# Patient Record
Sex: Male | Born: 1960 | Race: White | Hispanic: No | Marital: Married | Smoking: Never smoker
Health system: Southern US, Community
[De-identification: ages and names within clinical notes are randomized; demographics above are authoritative.]

## PROBLEM LIST (undated history)

## (undated) DIAGNOSIS — A9230 West Nile virus infection, unspecified: Secondary | ICD-10-CM

## (undated) DIAGNOSIS — Z8673 Personal history of transient ischemic attack (TIA), and cerebral infarction without residual deficits: Secondary | ICD-10-CM

## (undated) DIAGNOSIS — R251 Tremor, unspecified: Secondary | ICD-10-CM

## (undated) DIAGNOSIS — E785 Hyperlipidemia, unspecified: Secondary | ICD-10-CM

## (undated) DIAGNOSIS — I10 Essential (primary) hypertension: Secondary | ICD-10-CM

## (undated) DIAGNOSIS — G4730 Sleep apnea, unspecified: Secondary | ICD-10-CM

## (undated) HISTORY — DX: Sleep apnea, unspecified: G47.30

## (undated) HISTORY — DX: Hyperlipidemia, unspecified: E78.5

## (undated) HISTORY — DX: Essential (primary) hypertension: I10

## (undated) HISTORY — DX: Tremor, unspecified: R25.1

## (undated) HISTORY — DX: West nile virus infection, unspecified: A92.30

## (undated) HISTORY — PX: EYE SURGERY: SHX253

---

## 2006-05-22 DIAGNOSIS — A9230 West Nile virus infection, unspecified: Secondary | ICD-10-CM

## 2006-05-22 HISTORY — DX: West nile virus infection, unspecified: A92.30

## 2011-02-07 DIAGNOSIS — D369 Benign neoplasm, unspecified site: Secondary | ICD-10-CM | POA: Insufficient documentation

## 2011-02-07 DIAGNOSIS — G4733 Obstructive sleep apnea (adult) (pediatric): Secondary | ICD-10-CM | POA: Insufficient documentation

## 2011-02-07 DIAGNOSIS — Z86010 Personal history of colonic polyps: Secondary | ICD-10-CM | POA: Insufficient documentation

## 2011-02-07 DIAGNOSIS — E349 Endocrine disorder, unspecified: Secondary | ICD-10-CM | POA: Insufficient documentation

## 2011-02-07 DIAGNOSIS — Z8639 Personal history of other endocrine, nutritional and metabolic disease: Secondary | ICD-10-CM | POA: Insufficient documentation

## 2011-02-07 DIAGNOSIS — I340 Nonrheumatic mitral (valve) insufficiency: Secondary | ICD-10-CM | POA: Insufficient documentation

## 2011-02-07 DIAGNOSIS — G030 Nonpyogenic meningitis: Secondary | ICD-10-CM | POA: Insufficient documentation

## 2011-05-23 HISTORY — PX: HAND SURGERY: SHX662

## 2011-05-23 HISTORY — PX: ROTATOR CUFF REPAIR: SHX139

## 2011-10-06 ENCOUNTER — Ambulatory Visit (INDEPENDENT_AMBULATORY_CARE_PROVIDER_SITE_OTHER): Payer: Commercial Indemnity | Admitting: Cardiovascular Disease

## 2011-10-06 ENCOUNTER — Encounter: Payer: Self-pay | Admitting: Cardiovascular Disease

## 2011-10-06 VITALS — BP 157/108 | HR 64 | Ht 69.0 in | Wt 236.0 lb

## 2011-10-06 DIAGNOSIS — E785 Hyperlipidemia, unspecified: Secondary | ICD-10-CM

## 2011-10-06 DIAGNOSIS — R002 Palpitations: Secondary | ICD-10-CM

## 2011-10-06 DIAGNOSIS — R0600 Dyspnea, unspecified: Secondary | ICD-10-CM

## 2011-10-06 DIAGNOSIS — R0609 Other forms of dyspnea: Secondary | ICD-10-CM

## 2011-10-06 DIAGNOSIS — R079 Chest pain, unspecified: Secondary | ICD-10-CM

## 2011-10-06 DIAGNOSIS — I498 Other specified cardiac arrhythmias: Secondary | ICD-10-CM

## 2011-10-06 DIAGNOSIS — I10 Essential (primary) hypertension: Secondary | ICD-10-CM

## 2011-10-06 DIAGNOSIS — I4902 Ventricular flutter: Secondary | ICD-10-CM

## 2011-10-06 NOTE — Patient Instructions (Signed)
Start Aspirin 81 mg once daily.   Your physician has requested that you have an echocardiogram. Echocardiography is a painless test that uses sound waves to create images of your heart. It provides your doctor with information about the size and shape of your heart and how well your heart's chambers and valves are working. This procedure takes approximately one hour. There are no restrictions for this procedure.  Your physician has requested that you have en exercise stress myoview. For further information please visit [email or web address] Please follow instruction sheet, as given.  Do not take Metoprolol on day of stress test. You can take after the test is finished.   Follow up after tests.

## 2011-10-09 ENCOUNTER — Ambulatory Visit: Payer: Self-pay | Admitting: Cardiovascular Disease

## 2011-10-09 ENCOUNTER — Encounter: Payer: Self-pay | Admitting: Cardiovascular Disease

## 2011-10-09 DIAGNOSIS — E782 Mixed hyperlipidemia: Secondary | ICD-10-CM | POA: Insufficient documentation

## 2011-10-09 DIAGNOSIS — E785 Hyperlipidemia, unspecified: Secondary | ICD-10-CM | POA: Insufficient documentation

## 2011-10-09 DIAGNOSIS — R079 Chest pain, unspecified: Secondary | ICD-10-CM

## 2011-10-09 DIAGNOSIS — R0600 Dyspnea, unspecified: Secondary | ICD-10-CM | POA: Insufficient documentation

## 2011-10-09 DIAGNOSIS — R002 Palpitations: Secondary | ICD-10-CM | POA: Insufficient documentation

## 2011-10-09 MED ORDER — ASPIRIN EC 81 MG PO TBEC: 81.0000 mg | DELAYED_RELEASE_TABLET | Freq: Every day | ORAL | Status: AC

## 2011-10-09 NOTE — Assessment & Plan Note (Signed)
His blood pressure is elevated but he seems to be anxious. This will be evaluated during his stress test and upon followup.

## 2011-10-09 NOTE — Assessment & Plan Note (Signed)
His recent lipid profile showed a total cholesterol of 204, triglyceride 309, HDL of 39 and an LDL of 103. The patient would benefit from an exercise program and lifestyle changes. This will be discussed after his stress test.

## 2011-10-09 NOTE — Assessment & Plan Note (Signed)
His symptoms are suggestive of premature beats. He is already on high-dose Toprol. A Holter monitor can be considered in the future if his symptoms worsen. I think the priority now is to rule out underlying ischemic or structural heart disease.

## 2011-10-09 NOTE — Progress Notes (Signed)
HPI  This is a 51 year old gentleman who was referred by Dr. Juanetta Gosling for evaluation of palpitations and dyspnea. The patient reports prolonged history of palpitations described as skipping in his heart which has worsened recently. He has been on metoprolol for many years. He is not aware of any previous documented arrhythmia. He has been told in the past about leaky heart valves with no recent workup. He has not been feeling well recently. Overall he has been slowly recovering from shoulder surgery. He has noticed increased exertional dyspnea and few episodes of chest discomfort at rest described as an aching sensation without radiation. He has not been doing much physical activities recently. He denies any syncope or presyncope.  No Known Allergies   Current Outpatient Prescriptions on File Prior to Visit  Medication Sig Dispense Refill  . metoprolol (TOPROL-XL) 200 MG 24 hr tablet Take 200 mg by mouth daily.      Marland Kitchen testosterone cypionate (DEPOTESTOTERONE CYPIONATE) 100 MG/ML injection Inject into the muscle every 7 (seven) days. For IM use only         Past Medical History  Diagnosis Date  . Sleep apnea   . Hyperlipidemia   . Hypertension      Past Surgical History  Procedure Date  . Hand surgery 2013    carpal tunnel both hands  . Rotator cuff repair 2013    left     Family History  Problem Relation Age of Onset  . Cancer Father     prostate cancer  . Diabetes Father   . Heart attack Father   . Cancer Mother     breast cancer  . Diabetes Sister      History   Social History  . Marital Status: Married    Spouse Name: N/A    Number of Children: N/A  . Years of Education: N/A   Occupational History  . parent     full-time/current on FMLA   Social History Main Topics  . Smoking status: Never Smoker   . Smokeless tobacco: Not on file  . Alcohol Use: No  . Drug Use: No  . Sexually Active: Not on file   Other Topics Concern  . Not on file   Social  History Narrative  . No narrative on file     ROS Constitutional: Negative for fever, chills, diaphoresis, activity change, appetite change.  HENT: Negative for hearing loss, nosebleeds, congestion, sore throat, facial swelling, drooling, trouble swallowing, neck pain, voice change, sinus pressure and tinnitus.  Eyes: Negative for photophobia, pain, discharge and visual disturbance.  Respiratory: Negative for apnea, cough and wheezing.  Cardiovascular: Negative for  and leg swelling.  Gastrointestinal: Negative for nausea, vomiting, abdominal pain, diarrhea, constipation, blood in stool and abdominal distention.  Genitourinary: Negative for dysuria, urgency, frequency, hematuria and decreased urine volume.  Musculoskeletal: Negative for myalgias, back pain, joint swelling, arthralgias and gait problem.  Skin: Negative for color change, pallor, rash and wound.  Neurological: Negative for dizziness, tremors, seizures, syncope, speech difficulty, weakness, light-headedness, numbness and headaches.  Psychiatric/Behavioral: Negative for suicidal ideas, hallucinations, behavioral problems and agitation. The patient is not nervous/anxious.     PHYSICAL EXAM   BP 157/108  Pulse 64  Ht 5\' 9"  (1.753 m)  Wt 236 lb (107.049 kg)  BMI 34.85 kg/m2 Constitutional: He is oriented to person, place, and time. He appears well-developed and well-nourished. No distress.  HENT: No nasal discharge.  Head: Normocephalic and atraumatic.  Eyes: Pupils are equal  and round. Right eye exhibits no discharge. Left eye exhibits no discharge.  Neck: Normal range of motion. Neck supple. No JVD present. No thyromegaly present.  Cardiovascular: Normal rate, regular rhythm, normal heart sounds and. Exam reveals no gallop and no friction rub. No murmur heard.  Pulmonary/Chest: Effort normal and breath sounds normal. No stridor. No respiratory distress. He has no wheezes. He has no rales. He exhibits no tenderness.    Abdominal: Soft. Bowel sounds are normal. He exhibits no distension. There is no tenderness. There is no rebound and no guarding.  Musculoskeletal: Normal range of motion. He exhibits no edema and no tenderness.  Neurological: He is alert and oriented to person, place, and time. Coordination normal.  Skin: Skin is warm and dry. No rash noted. He is not diaphoretic. No erythema. No pallor.  Psychiatric: He has a normal mood and affect. His behavior is normal. Judgment and thought content normal.       [id number]  Rhythm -  T-abnormality  -Possible inferior ischemia.     ASSESSMENT AND PLAN

## 2011-10-09 NOTE — Assessment & Plan Note (Signed)
I will obtain an echocardiogram to evaluate this. He likely has some degree of diastolic dysfunction.

## 2011-10-09 NOTE — Assessment & Plan Note (Signed)
The patient's chest pain is overall atypical. However, he has significant exertional dyspnea which has worsened recently. His ECG is abnormal and showing evidence of inferior ischemia. I recommend evaluation with a treadmill nuclear stress test for further evaluation. Given his abnormal baseline ECG, a treadmill stress test along with not be sufficient for interpretation. In the meanwhile, I asked him to start taking aspirin 81 mg once daily. He is to hold Toprol and the day of the stress test.

## 2011-10-10 ENCOUNTER — Other Ambulatory Visit: Payer: Self-pay

## 2011-10-10 ENCOUNTER — Other Ambulatory Visit (INDEPENDENT_AMBULATORY_CARE_PROVIDER_SITE_OTHER): Payer: Commercial Indemnity

## 2011-10-10 DIAGNOSIS — R0609 Other forms of dyspnea: Secondary | ICD-10-CM

## 2011-10-10 DIAGNOSIS — R002 Palpitations: Secondary | ICD-10-CM

## 2011-10-10 DIAGNOSIS — R0600 Dyspnea, unspecified: Secondary | ICD-10-CM

## 2011-10-12 ENCOUNTER — Ambulatory Visit: Payer: Commercial Indemnity | Admitting: Cardiovascular Disease

## 2011-10-12 ENCOUNTER — Encounter: Payer: Self-pay | Admitting: Cardiovascular Disease

## 2011-10-12 ENCOUNTER — Ambulatory Visit (INDEPENDENT_AMBULATORY_CARE_PROVIDER_SITE_OTHER): Payer: Commercial Indemnity | Admitting: Cardiovascular Disease

## 2011-10-12 VITALS — BP 120/82 | HR 66 | Ht 66.0 in | Wt 238.0 lb

## 2011-10-12 DIAGNOSIS — R0600 Dyspnea, unspecified: Secondary | ICD-10-CM

## 2011-10-12 DIAGNOSIS — R0989 Other specified symptoms and signs involving the circulatory and respiratory systems: Secondary | ICD-10-CM

## 2011-10-12 DIAGNOSIS — E785 Hyperlipidemia, unspecified: Secondary | ICD-10-CM

## 2011-10-12 DIAGNOSIS — R002 Palpitations: Secondary | ICD-10-CM

## 2011-10-12 DIAGNOSIS — I10 Essential (primary) hypertension: Secondary | ICD-10-CM

## 2011-10-12 NOTE — Assessment & Plan Note (Signed)
His blood pressure was elevated recently and also during his stress as he had a hypertensive response. However, his blood pressure is normal today. I discussed with him the importance of low sodium diet. His blood pressure should be monitored closely. If it starts going up again an ARB/ACE inhibitor or amlodipine can be considered.

## 2011-10-12 NOTE — Assessment & Plan Note (Signed)
His stress test showed no evidence of ischemia an echocardiogram was overall unremarkable. There is likely an element of physical deconditioning. I recommend continuing treatment of risk factors as well as lifestyle changes, exercise program and an attempt of weight loss.

## 2011-10-12 NOTE — Patient Instructions (Signed)
Your stress test and echocardiogram were fine.  Follow up in 6 months.

## 2011-10-12 NOTE — Assessment & Plan Note (Signed)
He had elevated triglycerides and low HDL. He was started on fish oil by Dr. Juanetta Gosling. I discussed with him the importance of healthy diet and regular exercise.

## 2011-10-12 NOTE — Assessment & Plan Note (Signed)
This seems to be due to premature beats. Given his normal stress test and unremarkable echocardiogram, these are overall benign. He is already on high-dose Toprol. No evidence of other arrhythmia.

## 2011-10-12 NOTE — Progress Notes (Signed)
HPI  This is a 51 year old gentleman who is here today for a followup visit. He was referred by Dr. Juanetta Gosling for evaluation of palpitations and dyspnea. He was noted to have PACs. His ECG was abnormal with possible inferior ischemia. He underwent evaluation with a nuclear stress test which showed no evidence of ischemia with normal ejection fraction and no exercise induced arrhythmia. He did have a hypertensive response to exercise. His echocardiogram showed normal LV systolic function, no significant valvular abnormalities and no evidence of pulmonary hypertension. He is doing reasonably well.  No Known Allergies   Current Outpatient Prescriptions on File Prior to Visit  Medication Sig Dispense Refill  . aspirin EC 81 MG tablet Take 1 tablet (81 mg total) by mouth daily.  90 tablet  3  . metoprolol (TOPROL-XL) 200 MG 24 hr tablet Take 200 mg by mouth daily.      . Multiple Vitamin (MULTIVITAMIN) tablet Take 1 tablet by mouth daily.      Marland Kitchen testosterone cypionate (DEPOTESTOTERONE CYPIONATE) 100 MG/ML injection Inject into the muscle every 7 (seven) days. For IM use only         Past Medical History  Diagnosis Date  . Sleep apnea   . Hyperlipidemia   . Hypertension      Past Surgical History  Procedure Date  . Hand surgery 2013    carpal tunnel both hands  . Rotator cuff repair 2013    left     Family History  Problem Relation Age of Onset  . Cancer Father     prostate cancer  . Diabetes Father   . Heart attack Father   . Cancer Mother     breast cancer  . Diabetes Sister      History   Social History  . Marital Status: Married    Spouse Name: N/A    Number of Children: N/A  . Years of Education: N/A   Occupational History  . parent     full-time/current on FMLA   Social History Main Topics  . Smoking status: Never Smoker   . Smokeless tobacco: Not on file  . Alcohol Use: No  . Drug Use: No  . Sexually Active: Not on file   Other Topics Concern  .  Not on file   Social History Narrative  . No narrative on file     PHYSICAL EXAM   BP 120/82  Pulse 66  Ht 5\' 6"  (1.676 m)  Wt 238 lb (107.956 kg)  BMI 38.41 kg/m2  Constitutional: He is oriented to person, place, and time. He appears well-developed and well-nourished. No distress.  HENT: No nasal discharge.  Head: Normocephalic and atraumatic.  Eyes: Pupils are equal and round. Right eye exhibits no discharge. Left eye exhibits no discharge.  Neck: Normal range of motion. Neck supple. No JVD present. No thyromegaly present.  Cardiovascular: Normal rate, regular rhythm, normal heart sounds and. Exam reveals no gallop and no friction rub. No murmur heard.  Pulmonary/Chest: Effort normal and breath sounds normal. No stridor. No respiratory distress. He has no wheezes. He has no rales. He exhibits no tenderness.  Abdominal: Soft. Bowel sounds are normal. He exhibits no distension. There is no tenderness. There is no rebound and no guarding.  Musculoskeletal: Normal range of motion. He exhibits no edema and no tenderness.  Neurological: He is alert and oriented to person, place, and time. Coordination normal.  Skin: Skin is warm and dry. No rash noted. He is not  diaphoretic. No erythema. No pallor.  Psychiatric: He has a normal mood and affect. His behavior is normal. Judgment and thought content normal.        ASSESSMENT AND PLAN

## 2011-10-13 ENCOUNTER — Other Ambulatory Visit: Payer: Self-pay | Admitting: Cardiovascular Disease

## 2014-02-13 DIAGNOSIS — I10 Essential (primary) hypertension: Secondary | ICD-10-CM | POA: Insufficient documentation

## 2014-02-13 DIAGNOSIS — G5713 Meralgia paresthetica, bilateral lower limbs: Secondary | ICD-10-CM | POA: Insufficient documentation

## 2014-09-15 LAB — HM COLONOSCOPY

## 2014-10-21 DIAGNOSIS — M722 Plantar fascial fibromatosis: Secondary | ICD-10-CM | POA: Insufficient documentation

## 2015-06-28 DIAGNOSIS — I10 Essential (primary) hypertension: Secondary | ICD-10-CM | POA: Diagnosis not present

## 2015-06-30 ENCOUNTER — Ambulatory Visit (INDEPENDENT_AMBULATORY_CARE_PROVIDER_SITE_OTHER): Payer: Self-pay | Admitting: Family Medicine

## 2015-06-30 ENCOUNTER — Encounter: Payer: Self-pay | Admitting: Family Medicine

## 2015-06-30 VITALS — BP 138/100 | HR 65 | Resp 16 | Ht 71.0 in | Wt 228.6 lb

## 2015-06-30 DIAGNOSIS — Z8639 Personal history of other endocrine, nutritional and metabolic disease: Secondary | ICD-10-CM

## 2015-06-30 DIAGNOSIS — Z86010 Personal history of colonic polyps: Secondary | ICD-10-CM

## 2015-06-30 DIAGNOSIS — G4730 Sleep apnea, unspecified: Secondary | ICD-10-CM

## 2015-06-30 DIAGNOSIS — E669 Obesity, unspecified: Secondary | ICD-10-CM | POA: Insufficient documentation

## 2015-06-30 DIAGNOSIS — Z23 Encounter for immunization: Secondary | ICD-10-CM

## 2015-06-30 DIAGNOSIS — I10 Essential (primary) hypertension: Secondary | ICD-10-CM

## 2015-06-30 DIAGNOSIS — Z8249 Family history of ischemic heart disease and other diseases of the circulatory system: Secondary | ICD-10-CM

## 2015-06-30 DIAGNOSIS — M722 Plantar fascial fibromatosis: Secondary | ICD-10-CM

## 2015-06-30 DIAGNOSIS — Z6838 Body mass index (BMI) 38.0-38.9, adult: Secondary | ICD-10-CM | POA: Insufficient documentation

## 2015-06-30 DIAGNOSIS — E785 Hyperlipidemia, unspecified: Secondary | ICD-10-CM

## 2015-06-30 DIAGNOSIS — G5710 Meralgia paresthetica, unspecified lower limb: Secondary | ICD-10-CM

## 2015-06-30 DIAGNOSIS — H547 Unspecified visual loss: Secondary | ICD-10-CM

## 2015-06-30 DIAGNOSIS — R002 Palpitations: Secondary | ICD-10-CM

## 2015-06-30 DIAGNOSIS — Z833 Family history of diabetes mellitus: Secondary | ICD-10-CM

## 2015-06-30 DIAGNOSIS — Z8042 Family history of malignant neoplasm of prostate: Secondary | ICD-10-CM

## 2015-06-30 MED ORDER — LISINOPRIL-HYDROCHLOROTHIAZIDE 20-25 MG PO TABS: 1.0000 | ORAL_TABLET | Freq: Every day | ORAL | Status: DC

## 2015-06-30 NOTE — Progress Notes (Signed)
Date:  06/30/2015   Name:  Jay Ford   DOB:  [birth year]   MRN:  [id number]  PCP:  Dicky Doe, MD    Chief Complaint: Establish Care   History of Present Illness:  This is a 55 y.o. male to establish care. Has MMP with inconsistent primary care past several years, last saw [facility] primary care in October. Recent poorly controlled HTN, was on Toprol XL in past but switched to lisinopril a few years ago but has been taking inconsistently. BP 174/117 recently in ED, left before being seen. Reports nonspecific nonexertional chest tightness lately, hx palpitations and negative stress test in 2013. Hx HLD with high TG and low HDL on fish oil in past. 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 Seeing [facility] ortho for plantar fasciitis, PT recommended. Hx OSA on CPAP past year, unsure if helping, had sleep studies at [facility] Hx low testosterone on injections in past. C/o weight increase past month but diet the same, gets no regular exercise. Disability pending for R shoulder but no current insurance coverage. Tetanus status unknown. Had colonoscopy 2015 with polyp, 5 yr f/u recommended. Father died prostate ca age 49, had DM/HTN/CAD, mother with lymph node cancer, also with DM/HTN/CAD, sister with DM. Says vision poor and needs to see eye doctor. Last blood work in October including a1c and hep C normal, last lipids and TSH in 2015.  Review of Systems:  Review of Systems  Constitutional: Negative for fever and chills.  HENT: Negative for ear pain and sore throat.   Eyes: Negative for pain.  Respiratory: Negative for cough and shortness of breath.   Cardiovascular: Negative for leg swelling.  Gastrointestinal: Negative for abdominal pain.  Endocrine: Negative for polyuria.  Genitourinary: Negative for difficulty urinating.  Neurological: Negative for syncope and light-headedness.    Patient  Active Problem List   Diagnosis Date Noted  . FH: prostate cancer 06/30/2015  . FH: diabetes mellitus 06/30/2015  . FH: heart disease 06/30/2015  . Obesity, Class I, BMI 30-34.9 06/30/2015  . Plantar fasciitis 10/21/2014  . 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 Chest pain 10/09/2011  . Dyspnea 10/09/2011  . Palpitations 10/09/2011  . Hyperlipidemia   . Hypertension   . Abacterial meningitis 02/07/2011  . 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 Apnea, sleep 02/07/2011  . Hx of hypotestosteronemia 02/07/2011  . Hx of colonic polyp 02/07/2011    Prior to Admission medications   Medication Sig Start Date End Date Taking? Authorizing Provider  lisinopril-hydrochlorothiazide (PRINZIDE,ZESTORETIC) 20-25 MG tablet Take 1 tablet by mouth daily. 06/30/15   Adline Potter, MD    Allergies  Allergen Reactions  . Gabapentin Other (See Comments)    Is not right    Past Surgical History  Procedure Laterality Date  . Hand surgery  2013    carpal tunnel both hands  . Rotator cuff repair  2013    left    Social History  Substance Use Topics  . Smoking status: Never Smoker   . Smokeless tobacco: None  . Alcohol Use: No    Family History  Problem Relation Age of Onset  . Cancer Father     prostate cancer  . Diabetes Father   . Heart attack Father   . Cancer Mother     breast cancer  . Diabetes Sister     Medication list has been reviewed and updated.  Physical Examination: BP 138/100 mmHg  Pulse 65  Resp 16  Ht 5\' 11"  (1.803 m)  Wt 228 lb 9.6 oz (103.692 kg)  BMI 31.90 kg/m2  SpO2 98%  Physical Exam  Constitutional: He is oriented to person, place, and time. He appears well-developed and well-nourished.  HENT:  Head: Normocephalic and atraumatic.  Right Ear: External ear normal.  Left Ear: External ear normal.  Nose: Nose normal.  Mouth/Throat: Oropharynx is clear and moist.  TM's clear  Eyes: Conjunctivae and EOM are normal. Pupils are equal, round, and reactive  to light. No scleral icterus.  Neck: Neck supple. No thyromegaly present.  Cardiovascular: Normal rate, regular rhythm and normal heart sounds.   Pulmonary/Chest: Effort normal and breath sounds normal.  Abdominal: Soft. He exhibits no distension and no mass. There is no tenderness.  Genitourinary: Rectum normal, prostate normal and penis normal.  Testes normal, no hernias  Musculoskeletal: He exhibits no edema.  Lymphadenopathy:    He has no cervical adenopathy.  Neurological: He is alert and oriented to person, place, and time.  Moderate R hand intention tremor  Skin: Skin is warm and dry.  Psychiatric: He has a normal mood and affect. His behavior is normal.  Nursing note and vitals reviewed.   Assessment and Plan:  1. Essential hypertension Poor control, reports better control on Toprol XL in past but likely to lower pulse, change to Prinzide 20/25 daily  2. Bernhardt's paresthesia, unspecified laterality Followed by [facility] neurology, head MRI pending  3. Plantar fasciitis Followed by [facility] ortho, PT recommended  4. Apnea, sleep on CPAP Consider reeval at [facility] as may be contributing to poor HTN control  5. Hyperlipidemia On fish oil in past, consider lipid panel next visit  6. Palpitations Stable at present, consider repeat stress test (negative 2013) if chest tightness persists  7. Obesity, Class I, BMI 30-34.9 Discussed exercise/weight loss  8. Decreased visual acuity Recommended optho eval  9. Hx of hypotestosteronemia On depo injections in past, would withhold at present given FH prostate ca  10. Hx of colonic polyp For repeat colonoscopy 2020  11. FH: prostate cancer  12. FH: diabetes mellitus  13. FH: heart disease  14. Need for tetanus booster - Tdap vaccine greater than or equal to 7yo IM   Return in about 4 weeks (around 07/28/2015).  Satira Anis. [facility]  06/30/2015

## 2015-07-21 ENCOUNTER — Ambulatory Visit (INDEPENDENT_AMBULATORY_CARE_PROVIDER_SITE_OTHER): Payer: Self-pay | Admitting: Family Medicine

## 2015-07-21 ENCOUNTER — Encounter: Payer: Self-pay | Admitting: Family Medicine

## 2015-07-21 VITALS — BP 102/82 | HR 71 | Temp 98.6°F | Resp 16 | Ht 71.0 in | Wt 222.6 lb

## 2015-07-21 DIAGNOSIS — B9789 Other viral agents as the cause of diseases classified elsewhere: Principal | ICD-10-CM

## 2015-07-21 DIAGNOSIS — J069 Acute upper respiratory infection, unspecified: Secondary | ICD-10-CM

## 2015-07-21 DIAGNOSIS — I10 Essential (primary) hypertension: Secondary | ICD-10-CM

## 2015-07-21 MED ORDER — LISINOPRIL-HYDROCHLOROTHIAZIDE 20-25 MG PO TABS: 0.5000 | ORAL_TABLET | Freq: Every day | ORAL | Status: DC

## 2015-07-21 NOTE — Progress Notes (Signed)
Date:  07/21/2015   Name:  Jay Ford   DOB:  [birth year]   MRN:  [id number]  PCP:  Dicky Doe, MD    Chief Complaint: Cough   History of Present Illness:  This is a 55 y.o. male reports 6d hx rhinorrhea, sore throat, cough prod clear phlegm, initial fever, all sxs improving. Has felt intermittent lightheadedness since starting Prinzide.  Review of Systems:  Review of Systems  HENT: Negative for ear pain and trouble swallowing.   Eyes: Negative for pain.  Respiratory: Negative for shortness of breath.   Cardiovascular: Negative for chest pain and leg swelling.  Neurological: Negative for syncope.    Patient Active Problem List   Diagnosis Date Noted  . FH: prostate cancer 06/30/2015  . FH: diabetes mellitus 06/30/2015  . FH: heart disease 06/30/2015  . Obesity, Class I, BMI 30-34.9 06/30/2015  . Plantar fasciitis 10/21/2014  . 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 Chest pain 10/09/2011  . Dyspnea 10/09/2011  . Palpitations 10/09/2011  . Hyperlipidemia   . Hypertension   . Abacterial meningitis 02/07/2011  . 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 Apnea, sleep 02/07/2011  . Hx of hypotestosteronemia 02/07/2011  . Hx of colonic polyp 02/07/2011    Prior to Admission medications   Medication Sig Start Date End Date Taking? Authorizing Provider  guaiFENesin (MUCINEX) 600 MG 12 hr tablet Take by mouth 2 (two) times daily.   Yes Historical Provider, MD  lisinopril-hydrochlorothiazide (PRINZIDE,ZESTORETIC) 20-25 MG tablet Take 0.5 tablets by mouth daily. 07/21/15  Yes Adline Potter, MD  Pseudoeph-Doxylamine-DM-APAP (NYQUIL PO) Take by mouth.   Yes Historical Provider, MD    Allergies  Allergen Reactions  . Gabapentin Other (See Comments)    Is not right    Past Surgical History  Procedure Laterality Date  . Hand surgery  2013    carpal tunnel both hands  . Rotator cuff repair  2013    left    Social History  Substance Use Topics  . Smoking status: Never  Smoker   . Smokeless tobacco: None  . Alcohol Use: No    Family History  Problem Relation Age of Onset  . Cancer Father     prostate cancer  . Diabetes Father   . Heart attack Father   . Cancer Mother     breast cancer  . Diabetes Sister     Medication list has been reviewed and updated.  Physical Examination: BP 102/82 mmHg  Pulse 71  Temp(Src) 98.6 F (37 C) (Oral)  Resp 16  Ht 5\' 11"  (1.803 m)  Wt 222 lb 9.6 oz (100.971 kg)  BMI 31.06 kg/m2  SpO2 100%  Physical Exam  Constitutional: He appears well-developed and well-nourished.  HENT:  Mouth/Throat: Oropharynx is clear and moist.  Neck: Neck supple.  Cardiovascular: Normal rate, regular rhythm and normal heart sounds.   Pulmonary/Chest: Effort normal and breath sounds normal.  Musculoskeletal: He exhibits no edema.  Lymphadenopathy:    He has no cervical adenopathy.  Neurological: He is alert.  Skin: Skin is warm and dry.  Psychiatric: He has a normal mood and affect. His behavior is normal.  Nursing note and vitals reviewed.   Assessment and Plan:  1. Viral URI with cough Sxs resolving spontaneously, expect resolution over next 3-5 days  2. Essential hypertension Overtreated, decrease Prinzide to 10/12.5 daily  Return if symptoms worsen or fail to improve.  Satira Anis. [facility]  07/21/2015

## 2015-07-29 ENCOUNTER — Ambulatory Visit (INDEPENDENT_AMBULATORY_CARE_PROVIDER_SITE_OTHER): Payer: Self-pay | Admitting: Family Medicine

## 2015-07-29 ENCOUNTER — Encounter: Payer: Self-pay | Admitting: Family Medicine

## 2015-07-29 VITALS — BP 103/72 | HR 68 | Temp 98.0°F | Resp 16 | Ht 71.0 in | Wt 221.2 lb

## 2015-07-29 DIAGNOSIS — E785 Hyperlipidemia, unspecified: Secondary | ICD-10-CM

## 2015-07-29 DIAGNOSIS — E669 Obesity, unspecified: Secondary | ICD-10-CM

## 2015-07-29 DIAGNOSIS — I10 Essential (primary) hypertension: Secondary | ICD-10-CM

## 2015-07-29 DIAGNOSIS — G4730 Sleep apnea, unspecified: Secondary | ICD-10-CM

## 2015-07-29 DIAGNOSIS — R002 Palpitations: Secondary | ICD-10-CM

## 2015-07-29 MED ORDER — LISINOPRIL-HYDROCHLOROTHIAZIDE 10-12.5 MG PO TABS: 1.0000 | ORAL_TABLET | Freq: Every day | ORAL | Status: DC

## 2015-07-29 NOTE — Progress Notes (Signed)
Date:  07/29/2015   Name:  Jay Ford   DOB:  03-05-1961   MRN:  [id number]  PCP:  Dicky Doe, MD    Chief Complaint: Hypertension   History of Present Illness:  This is a 55 y.o. male for one month f/u from initial visit. Placed on Prinzide 20/25 daily at that time but developed lightheadedness and dose decreased to 10/12.5 daily. Lightheadedness, chest pains, and palpitations now resolved, feels well. BP's around 113/70 at home. Resp sxs from last week mostly resolved except for mild cough. Still using CPAP, had to reschedule [facility] neurology visit. Has been dieting and exercising more, has not yet seen optho but plans to schedule.  Review of Systems:  Review of Systems  Constitutional: Negative for fatigue.  Respiratory: Negative for shortness of breath.   Cardiovascular: Negative for chest pain and leg swelling.  Neurological: Negative for syncope and light-headedness.    Patient Active Problem List   Diagnosis Date Noted  . FH: prostate cancer 06/30/2015  . FH: diabetes mellitus 06/30/2015  . FH: heart disease 06/30/2015  . Obesity, Class I, BMI 30-34.9 06/30/2015  . Plantar fasciitis 10/21/2014  . 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 Palpitations 10/09/2011  . Hyperlipidemia   . Hypertension   . 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 Apnea, sleep 02/07/2011  . Hx of hypotestosteronemia 02/07/2011  . Hx of colonic polyp 02/07/2011    Prior to Admission medications   Medication Sig Start Date End Date Taking? Authorizing Provider  lisinopril-hydrochlorothiazide (PRINZIDE,ZESTORETIC) 10-12.5 MG tablet Take 1 tablet by mouth daily. 07/29/15   Adline Potter, MD    Allergies  Allergen Reactions  . Gabapentin Other (See Comments)    Is not right    Past Surgical History  Procedure Laterality Date  . Hand surgery  2013    carpal tunnel both hands  . Rotator cuff repair  2013    left    Social History  Substance Use Topics  . Smoking status: Never Smoker    . Smokeless tobacco: None  . Alcohol Use: No    Family History  Problem Relation Age of Onset  . Cancer Father     prostate cancer  . Diabetes Father   . Heart attack Father   . Cancer Mother     breast cancer  . Diabetes Sister     Medication list has been reviewed and updated.  Physical Examination: BP 103/72 mmHg  Pulse 68  Temp(Src) 98 F (36.7 C) (Oral)  Resp 16  Ht 5\' 11"  (1.803 m)  Wt 221 lb 3.2 oz (100.336 kg)  BMI 30.86 kg/m2  SpO2 98%  Physical Exam  Constitutional: He appears well-developed and well-nourished.  Cardiovascular: Normal rate, regular rhythm and normal heart sounds.   Pulmonary/Chest: Effort normal and breath sounds normal.  Musculoskeletal: He exhibits no edema.  Neurological: He is alert.  Skin: Skin is warm and dry.  Psychiatric: He has a normal mood and affect. His behavior is normal.  Nursing note and vitals reviewed.   Assessment and Plan:  1. Essential hypertension Well controlled, refill Prinzide 10/12.5 daily - Comprehensive Metabolic Panel (CMET) - Lipid Profile  2. Hyperlipidemia Lipid profile as above  3. Palpitations/chest pain Resolved, negative stress test 2013  4. Apnea, sleep Cont CPAP  5. Obesity, Class I, BMI 30-34.9 Weight down 7#, encouraged continued weight loss  Return in about 3 months (around 10/29/2015).  Satira Anis. [facility]  07/29/2015

## 2015-10-29 ENCOUNTER — Ambulatory Visit: Payer: Self-pay | Admitting: Family Medicine

## 2016-01-19 ENCOUNTER — Ambulatory Visit (INDEPENDENT_AMBULATORY_CARE_PROVIDER_SITE_OTHER): Payer: Medicare Other | Admitting: Internal Medicine

## 2016-01-19 ENCOUNTER — Encounter: Payer: Self-pay | Admitting: Internal Medicine

## 2016-01-19 VITALS — BP 144/86 | HR 84 | Resp 16 | Ht 71.0 in | Wt 220.0 lb

## 2016-01-19 DIAGNOSIS — G4730 Sleep apnea, unspecified: Secondary | ICD-10-CM | POA: Diagnosis not present

## 2016-01-19 DIAGNOSIS — I10 Essential (primary) hypertension: Secondary | ICD-10-CM | POA: Diagnosis not present

## 2016-01-19 DIAGNOSIS — E785 Hyperlipidemia, unspecified: Secondary | ICD-10-CM

## 2016-01-19 DIAGNOSIS — A9232 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: Secondary | ICD-10-CM | POA: Diagnosis not present

## 2016-01-19 DIAGNOSIS — I340 Nonrheumatic mitral (valve) insufficiency: Secondary | ICD-10-CM

## 2016-01-19 MED ORDER — METOPROLOL SUCCINATE ER 25 MG PO TB24: 25.0000 mg | ORAL_TABLET | Freq: Every day | ORAL | 5 refills | Status: DC

## 2016-01-19 NOTE — Progress Notes (Signed)
Date:  01/19/2016   Name:  Jay Ford   DOB:  [birth year]   MRN:  [id number]   Chief Complaint: Hypertension (No meds work. He stopped ALL meds. Still shaking and fatigue post west nile virus. ) Hypertension  This is a chronic problem. The problem is uncontrolled. Associated symptoms include headaches. Pertinent negatives include no chest pain, palpitations or shortness of breath.   He has been on numerous medications in the past but could not tolerate them - mostly due to dizziness and fatigue.  Currently on no medication.  He thinks that metoprolol was the best tolerated but it was stopped for unclear reasons.  He continues to have neurological sx - numbness and pain in LUE, tingling in RUE and burning pain in both thighs.  [facility] saw him last 02/2015 and had planned to do an MRI due to the mix of symptoms that could not correlated with one neurological process.  He lost his insurance and did not return.  He manages his sx with soaking in a hot tub.  OSA - he has CPAP and is compliant.  He uses it every night and sleeps well.  Mitral insufficiency - noted on work up for chest pain in 2011.  Noted to be mild.  Patient denies chest pain or SOB with minimal exertion.  Hyperlipidemia - not on medications.  Was supposed to get labs last visit but did not have insurance.  Review of Systems  Constitutional: Positive for fatigue.  Respiratory: Negative for chest tightness, shortness of breath and wheezing.   Cardiovascular: Positive for leg swelling. Negative for chest pain and palpitations.  Gastrointestinal: Negative for abdominal pain.  Musculoskeletal: Positive for myalgias (bilateral knee to groin).  Skin: Negative for rash.  Neurological: Positive for tremors, weakness, numbness and headaches.    Patient Active Problem List   Diagnosis Date Noted  . 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 FH: prostate cancer 06/30/2015  . FH: diabetes  mellitus 06/30/2015  . FH: heart disease 06/30/2015  . Obesity, Class I, BMI 30-34.9 06/30/2015  . Plantar fasciitis 10/21/2014  . Meralgia paresthetica of both lower extremities 02/13/2014  . Palpitations 10/09/2011  . Hyperlipidemia   . Hypertension   . 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 Apnea, sleep 02/07/2011  . Hx of hypotestosteronemia 02/07/2011  . Hx of colonic polyp 02/07/2011    Prior to Admission medications   Not on File    Allergies  Allergen Reactions  . Gabapentin Other (See Comments)    Is not right    Past Surgical History:  Procedure Laterality Date  . HAND SURGERY  2013   carpal tunnel both hands  . ROTATOR CUFF REPAIR  2013   left    Social History  Substance Use Topics  . Smoking status: Never Smoker  . Smokeless tobacco: Never Used  . Alcohol use No     Medication list has been reviewed and updated.   Physical Exam  Constitutional: He is oriented to person, place, and time. He appears well-developed. No distress.  HENT:  Head: Normocephalic and atraumatic.  Neck: Normal range of motion. Neck supple.  Cardiovascular: Normal rate, regular rhythm and normal heart sounds.   Pulmonary/Chest: Effort normal and breath sounds normal. No respiratory distress. He has no wheezes.  Musculoskeletal: He exhibits no edema.  Neurological: He is alert and oriented to person, place, and time.  Skin: Skin is warm and dry. No rash noted.  Psychiatric:  He has a normal mood and affect. His behavior is normal. Thought content normal.  Nursing note and vitals reviewed.   BP (!) 144/86   Pulse 84   Resp 16   Ht 5\' 11"  (1.803 m)   Wt 220 lb (99.8 kg)   SpO2 98%   BMI 30.68 kg/m   Assessment and Plan: 1. MI (mitral incompetence) asx - no chest pain or SOB  2. Essential hypertension Not controlled - will try low dose beta blocker - metoprolol succinate (TOPROL-XL) 25 MG 24 hr tablet; Take 1 tablet (25 mg total) by mouth at bedtime. Take with or  immediately following a meal.  Dispense: 30 tablet; Refill: 5 - CBC with Differential/Platelet - Comprehensive metabolic panel - TSH  3. Hyperlipidemia Check labs - Lipid panel  4. Apnea, sleep Compliant with CPAP  5. 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

## 2016-01-20 LAB — COMPREHENSIVE METABOLIC PANEL
ALBUMIN: 4.7 g/dL (ref 3.5–5.5)
ALK PHOS: 83 IU/L (ref 39–117)
ALT: 27 IU/L (ref 0–44)
AST: 25 IU/L (ref 0–40)
Albumin/Globulin Ratio: 1.5 (ref 1.2–2.2)
BILIRUBIN TOTAL: 0.3 mg/dL (ref 0.0–1.2)
BUN / CREAT RATIO: 14 (ref 9–20)
BUN: 15 mg/dL (ref 6–24)
CHLORIDE: 101 mmol/L (ref 96–106)
CO2: 22 mmol/L (ref 18–29)
Calcium: 9.4 mg/dL (ref 8.7–10.2)
Creatinine, Ser: 1.04 mg/dL (ref 0.76–1.27)
GFR calc Af Amer: 94 mL/min/{1.73_m2} (ref >59)
GFR calc non Af Amer: 81 mL/min/{1.73_m2} (ref >59)
GLUCOSE: 92 mg/dL (ref 65–99)
Globulin, Total: 3.1 g/dL (ref 1.5–4.5)
POTASSIUM: 4.1 mmol/L (ref 3.5–5.2)
SODIUM: 140 mmol/L (ref 134–144)
Total Protein: 7.8 g/dL (ref 6.0–8.5)

## 2016-01-20 LAB — CBC WITH DIFFERENTIAL/PLATELET
BASOS ABS: 0 10*3/uL (ref 0.0–0.2)
Basos: 0 %
EOS (ABSOLUTE): 0.1 10*3/uL (ref 0.0–0.4)
Eos: 2 %
HEMOGLOBIN: 14.8 g/dL (ref 12.6–17.7)
Hematocrit: 43.9 % (ref 37.5–51.0)
Immature Grans (Abs): 0 10*3/uL (ref 0.0–0.1)
Immature Granulocytes: 0 %
LYMPHS ABS: 2.3 10*3/uL (ref 0.7–3.1)
Lymphs: 31 %
MCH: 30.6 pg (ref 26.6–33.0)
MCHC: 33.7 g/dL (ref 31.5–35.7)
MCV: 91 fL (ref 79–97)
MONOCYTES: 9 %
Monocytes Absolute: 0.7 10*3/uL (ref 0.1–0.9)
NEUTROS ABS: 4.3 10*3/uL (ref 1.4–7.0)
Neutrophils: 58 %
Platelets: 213 10*3/uL (ref 150–379)
RBC: 4.84 x10E6/uL (ref 4.14–5.80)
RDW: 13.1 % (ref 12.3–15.4)
WBC: 7.4 10*3/uL (ref 3.4–10.8)

## 2016-01-20 LAB — LIPID PANEL
CHOLESTEROL TOTAL: 210 mg/dL — AB (ref 100–199)
Chol/HDL Ratio: 4.1 ratio units (ref 0.0–5.0)
HDL: 51 mg/dL (ref >39)
LDL Calculated: 107 mg/dL — ABNORMAL HIGH (ref 0–99)
Triglycerides: 258 mg/dL — ABNORMAL HIGH (ref 0–149)
VLDL CHOLESTEROL CAL: 52 mg/dL — AB (ref 5–40)

## 2016-01-20 LAB — TSH: TSH: 0.954 u[IU]/mL (ref 0.450–4.500)

## 2016-03-20 ENCOUNTER — Encounter: Payer: Self-pay | Admitting: Internal Medicine

## 2016-03-20 ENCOUNTER — Ambulatory Visit (INDEPENDENT_AMBULATORY_CARE_PROVIDER_SITE_OTHER): Payer: Medicare Other | Admitting: Internal Medicine

## 2016-03-20 VITALS — BP 136/84 | HR 60 | Resp 16 | Ht 71.0 in | Wt 219.0 lb

## 2016-03-20 DIAGNOSIS — G5713 Meralgia paresthetica, bilateral lower limbs: Secondary | ICD-10-CM

## 2016-03-20 DIAGNOSIS — A9232 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: Secondary | ICD-10-CM

## 2016-03-20 DIAGNOSIS — G4733 Obstructive sleep apnea (adult) (pediatric): Secondary | ICD-10-CM | POA: Diagnosis not present

## 2016-03-20 DIAGNOSIS — Z23 Encounter for immunization: Secondary | ICD-10-CM

## 2016-03-20 DIAGNOSIS — I10 Essential (primary) hypertension: Secondary | ICD-10-CM | POA: Diagnosis not present

## 2016-03-20 NOTE — Progress Notes (Signed)
Date:  03/20/2016   Name:  Jay Ford   DOB:  [birth year]   MRN:  [id number]   Chief Complaint: Hypertension and Leg Pain (Would like to have a ref to get some hot water therapy of some kind for leg pain. ) Hypertension  This is a chronic problem. The current episode started more than 1 year ago. The problem has been gradually improving since onset. Associated symptoms include headaches. Pertinent negatives include no chest pain, palpitations or shortness of breath.  Leg Pain   The pain is present in the left leg and right leg. The quality of the pain is described as aching. The pain has been fluctuating since onset. Associated symptoms include numbness. He has tried heat (does best with hot showers/baths) for the symptoms.  He only has a shower in his house.  He would like to get a hot tub through medical orders. I advised he discuss with [facility] and if they will put something in a progress note that heat therapy is appropriate and justified for his condition then he can try to get it covered. Sleep apnea- uses CPAP regularly and sleeps well.     Review of Systems  Constitutional: Positive for fatigue.  HENT: Negative for trouble swallowing.   Eyes: Positive for visual disturbance (decreased vision).  Respiratory: Negative for chest tightness, shortness of breath and wheezing.   Cardiovascular: Positive for leg swelling. Negative for chest pain and palpitations.  Gastrointestinal: Negative for abdominal pain and blood in stool.  Musculoskeletal: Positive for arthralgias and myalgias.  Skin: Negative for rash.  Neurological: Positive for tremors, weakness, numbness and headaches.  Hematological: Negative for adenopathy. Does not bruise/bleed easily.  Psychiatric/Behavioral: Negative for dysphoric mood and sleep disturbance.    Patient Active Problem List   Diagnosis Date Noted  . 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 FH: prostate cancer  06/30/2015  . FH: diabetes mellitus 06/30/2015  . FH: heart disease 06/30/2015  . Obesity, Class I, BMI 30-34.9 06/30/2015  . Plantar fasciitis 10/21/2014  . Meralgia paresthetica of both lower extremities 02/13/2014  . Palpitations 10/09/2011  . Hyperlipidemia   . Essential hypertension   . 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 Apnea, sleep 02/07/2011  . Hx of hypotestosteronemia 02/07/2011  . Hx of colonic polyp 02/07/2011    Prior to Admission medications   Medication Sig Start Date End Date Taking? Authorizing Provider  metoprolol succinate (TOPROL-XL) 25 MG 24 hr tablet Take 1 tablet (25 mg total) by mouth at bedtime. Take with or immediately following a meal. 01/19/16  Yes Glean Hess, MD    Allergies  Allergen Reactions  . Gabapentin Other (See Comments)    Is not right    Past Surgical History:  Procedure Laterality Date  . HAND SURGERY  2013   carpal tunnel both hands  . ROTATOR CUFF REPAIR  2013   left    Social History  Substance Use Topics  . Smoking status: Never Smoker  . Smokeless tobacco: Never Used  . Alcohol use No     Medication list has been reviewed and updated.   Physical Exam  Constitutional: He is oriented to person, place, and time. He appears well-developed and well-nourished. No distress.  HENT:  Head: Normocephalic and atraumatic.  Neck: Normal range of motion. No thyromegaly present.  Cardiovascular: Normal rate, regular rhythm and normal heart sounds.   Pulmonary/Chest: Effort normal and breath sounds normal. No respiratory distress.  Musculoskeletal: He exhibits edema.  Neurological: He is alert and oriented to person, place, and time. He has normal strength. He displays no tremor. A sensory deficit (decreased sensation on right arm) is present.  Reflex Scores:      Bicep reflexes are 0 on the right side and 3+ on the left side.      Patellar reflexes are 0 on the right side and 3+ on the left side. Skin: Skin is warm and  dry. No rash noted.  Psychiatric: He has a normal mood and affect. His speech is normal and behavior is normal. Thought content normal.  Nursing note and vitals reviewed.   BP 136/84   Pulse 60   Resp 16   Ht 5\' 11"  (1.803 m)   Wt 219 lb (99.3 kg)   SpO2 98%   BMI 30.54 kg/m   Assessment and Plan: 1. Essential hypertension Improved control - continue  2. Meralgia paresthetica of both lower extremities Stable sx - relieved by heat/hot water soaks  3. 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 Obstructive sleep apnea syndrome Doing well on CPAP  5. Need for influenza vaccination - Flu Vaccine QUAD 36+ mos IM   Halina Maidens, MD [facility]  03/20/2016

## 2016-05-04 DIAGNOSIS — Z8619 Personal history of other infectious and parasitic diseases: Secondary | ICD-10-CM | POA: Diagnosis not present

## 2016-05-04 DIAGNOSIS — R29818 Other symptoms and signs involving the nervous system: Secondary | ICD-10-CM | POA: Diagnosis not present

## 2016-05-10 DIAGNOSIS — G894 Chronic pain syndrome: Secondary | ICD-10-CM | POA: Diagnosis not present

## 2016-05-10 DIAGNOSIS — M79651 Pain in right thigh: Secondary | ICD-10-CM | POA: Diagnosis not present

## 2016-05-10 DIAGNOSIS — M79652 Pain in left thigh: Secondary | ICD-10-CM | POA: Diagnosis not present

## 2016-05-10 DIAGNOSIS — M792 Neuralgia and neuritis, unspecified: Secondary | ICD-10-CM | POA: Diagnosis not present

## 2016-05-10 DIAGNOSIS — A9239 West Nile virus infection with other complications: Secondary | ICD-10-CM | POA: Diagnosis not present

## 2016-05-28 DIAGNOSIS — M47812 Spondylosis without myelopathy or radiculopathy, cervical region: Secondary | ICD-10-CM | POA: Diagnosis not present

## 2016-05-28 DIAGNOSIS — G629 Polyneuropathy, unspecified: Secondary | ICD-10-CM | POA: Diagnosis not present

## 2016-05-28 DIAGNOSIS — Z8619 Personal history of other infectious and parasitic diseases: Secondary | ICD-10-CM | POA: Diagnosis not present

## 2016-07-04 DIAGNOSIS — Z79899 Other long term (current) drug therapy: Secondary | ICD-10-CM | POA: Diagnosis not present

## 2016-07-04 DIAGNOSIS — M79652 Pain in left thigh: Secondary | ICD-10-CM | POA: Diagnosis not present

## 2016-07-04 DIAGNOSIS — G894 Chronic pain syndrome: Secondary | ICD-10-CM | POA: Diagnosis not present

## 2016-07-04 DIAGNOSIS — A9239 West Nile virus infection with other complications: Secondary | ICD-10-CM | POA: Diagnosis not present

## 2016-07-04 DIAGNOSIS — M79651 Pain in right thigh: Secondary | ICD-10-CM | POA: Diagnosis not present

## 2016-07-04 DIAGNOSIS — M792 Neuralgia and neuritis, unspecified: Secondary | ICD-10-CM | POA: Diagnosis not present

## 2016-07-04 DIAGNOSIS — G629 Polyneuropathy, unspecified: Secondary | ICD-10-CM | POA: Diagnosis not present

## 2016-07-04 DIAGNOSIS — Z5181 Encounter for therapeutic drug level monitoring: Secondary | ICD-10-CM | POA: Diagnosis not present

## 2016-07-04 DIAGNOSIS — Z8661 Personal history of infections of the central nervous system: Secondary | ICD-10-CM | POA: Diagnosis not present

## 2016-08-09 DIAGNOSIS — M79652 Pain in left thigh: Secondary | ICD-10-CM | POA: Diagnosis not present

## 2016-08-09 DIAGNOSIS — M79651 Pain in right thigh: Secondary | ICD-10-CM | POA: Diagnosis not present

## 2016-08-09 DIAGNOSIS — G894 Chronic pain syndrome: Secondary | ICD-10-CM | POA: Diagnosis not present

## 2016-08-09 DIAGNOSIS — Z0289 Encounter for other administrative examinations: Secondary | ICD-10-CM | POA: Insufficient documentation

## 2016-08-09 DIAGNOSIS — A9239 West Nile virus infection with other complications: Secondary | ICD-10-CM | POA: Diagnosis not present

## 2016-08-09 DIAGNOSIS — M792 Neuralgia and neuritis, unspecified: Secondary | ICD-10-CM | POA: Diagnosis not present

## 2016-09-06 DIAGNOSIS — M79652 Pain in left thigh: Secondary | ICD-10-CM | POA: Diagnosis not present

## 2016-09-06 DIAGNOSIS — G894 Chronic pain syndrome: Secondary | ICD-10-CM | POA: Diagnosis not present

## 2016-09-06 DIAGNOSIS — M792 Neuralgia and neuritis, unspecified: Secondary | ICD-10-CM | POA: Diagnosis not present

## 2016-09-06 DIAGNOSIS — M79651 Pain in right thigh: Secondary | ICD-10-CM | POA: Diagnosis not present

## 2016-09-18 ENCOUNTER — Encounter: Payer: Self-pay | Admitting: Internal Medicine

## 2016-09-18 ENCOUNTER — Ambulatory Visit (INDEPENDENT_AMBULATORY_CARE_PROVIDER_SITE_OTHER): Payer: Medicare Other | Admitting: Internal Medicine

## 2016-09-18 VITALS — BP 128/84 | HR 70 | Ht 71.0 in | Wt 211.0 lb

## 2016-09-18 DIAGNOSIS — A9232 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: Secondary | ICD-10-CM

## 2016-09-18 DIAGNOSIS — G5713 Meralgia paresthetica, bilateral lower limbs: Secondary | ICD-10-CM | POA: Diagnosis not present

## 2016-09-18 DIAGNOSIS — I10 Essential (primary) hypertension: Secondary | ICD-10-CM | POA: Diagnosis not present

## 2016-09-18 MED ORDER — METOPROLOL SUCCINATE ER 25 MG PO TB24: 25.0000 mg | ORAL_TABLET | Freq: Every day | ORAL | 5 refills | Status: DC

## 2016-09-18 NOTE — Progress Notes (Signed)
Date:  09/18/2016   Name:  Jay Ford   DOB:  03-26-1961   MRN:  [id number]   Chief Complaint: Hypertension Hypertension  This is a chronic problem. The problem is unchanged. The problem is controlled. Associated symptoms include headaches. Pertinent negatives include no chest pain, palpitations or shortness of breath. Past treatments include beta blockers. The current treatment provides significant improvement. There are no compliance problems.    Chronic pain - seen by pain clinic.  Started on low dose cymbalta and oxycodone at hs.  Feeling a bit better - less pain and better sleep.   Review of Systems  Constitutional: Positive for fatigue.  HENT: Negative for trouble swallowing.   Eyes: Positive for visual disturbance (decreased vision).  Respiratory: Negative for chest tightness, shortness of breath and wheezing.   Cardiovascular: Negative for chest pain, palpitations and leg swelling.  Gastrointestinal: Negative for abdominal pain and blood in stool.  Genitourinary: Negative for difficulty urinating.  Musculoskeletal: Positive for arthralgias and myalgias.  Skin: Negative for rash.  Neurological: Positive for tremors, weakness, numbness and headaches.  Hematological: Negative for adenopathy. Does not bruise/bleed easily.  Psychiatric/Behavioral: Negative for dysphoric mood and sleep disturbance.    Patient Active Problem List   Diagnosis Date Noted  . 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 FH: prostate cancer 06/30/2015  . FH: diabetes mellitus 06/30/2015  . FH: heart disease 06/30/2015  . Obesity, Class I, BMI 30-34.9 06/30/2015  . Plantar fasciitis 10/21/2014  . Meralgia paresthetica of both lower extremities 02/13/2014  . Palpitations 10/09/2011  . Hyperlipidemia   . Essential hypertension   . 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 Obstructive sleep apnea syndrome 02/07/2011  . Hx of hypotestosteronemia 02/07/2011    . Hx of colonic polyp 02/07/2011    Prior to Admission medications   Medication Sig Start Date End Date Taking? Authorizing Provider  DULoxetine (CYMBALTA) 20 MG capsule Take 20 mg by mouth daily. 08/09/16 08/09/17 Yes Historical Provider, MD  metoprolol succinate (TOPROL-XL) 25 MG 24 hr tablet Take 1 tablet (25 mg total) by mouth at bedtime. Take with or immediately following a meal. 01/19/16  Yes Glean Hess, MD  oxyCODONE (OXY IR/ROXICODONE) 5 MG immediate release tablet Take 2.5-5 mg by mouth 2 (two) times daily as needed. 09/06/16  Yes Historical Provider, MD    Allergies  Allergen Reactions  . Gabapentin Other (See Comments)    Is not right  . Topiramate Rash    Blistered rash on face    Past Surgical History:  Procedure Laterality Date  . HAND SURGERY  2013   carpal tunnel both hands  . ROTATOR CUFF REPAIR  2013   left    Social History  Substance Use Topics  . Smoking status: Never Smoker  . Smokeless tobacco: Never Used  . Alcohol use No     Medication list has been reviewed and updated.   Physical Exam  Constitutional: He is oriented to person, place, and time. He appears well-developed. No distress.  HENT:  Head: Normocephalic and atraumatic.  Neck: Normal range of motion. Neck supple. No thyromegaly present.  Cardiovascular: Normal rate, regular rhythm and normal heart sounds.   Pulmonary/Chest: Effort normal and breath sounds normal. No respiratory distress. He has no wheezes. He has no rales.  Musculoskeletal: Normal range of motion. He exhibits tenderness. He exhibits no edema.  Neurological: He is alert and oriented to person, place, and time.  Skin: Skin  is warm and dry. No rash noted.  Psychiatric: He has a normal mood and affect. His behavior is normal. Thought content normal.  Nursing note and vitals reviewed.   BP 128/84 (BP Location: Right Arm, Patient Position: Sitting, Cuff Size: Large)   Pulse 70   Ht 5\' 11"  (1.803 m)   Wt 211 lb (95.7  kg)   SpO2 99%   BMI 29.43 kg/m   Assessment and Plan: 1. Essential hypertension Controlled - continue current therapy - metoprolol succinate (TOPROL-XL) 25 MG 24 hr tablet; Take 1 tablet (25 mg total) by mouth at bedtime. Take with or immediately following a meal.  Dispense: 30 tablet; Refill: 5  2. Meralgia paresthetica of both lower extremities Consider higher dose cymbalta  3. 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 metoprolol succinate (TOPROL-XL) 25 MG 24 hr tablet    Sig: Take 1 tablet (25 mg total) by mouth at bedtime. Take with or immediately following a meal.    Dispense:  30 tablet    Refill:  [place], MD [facility]  09/18/2016

## 2016-09-27 DIAGNOSIS — M792 Neuralgia and neuritis, unspecified: Secondary | ICD-10-CM | POA: Diagnosis not present

## 2016-09-27 DIAGNOSIS — M79652 Pain in left thigh: Secondary | ICD-10-CM | POA: Diagnosis not present

## 2016-09-27 DIAGNOSIS — G894 Chronic pain syndrome: Secondary | ICD-10-CM | POA: Diagnosis not present

## 2016-09-27 DIAGNOSIS — M79651 Pain in right thigh: Secondary | ICD-10-CM | POA: Diagnosis not present

## 2016-11-14 DIAGNOSIS — M79652 Pain in left thigh: Secondary | ICD-10-CM | POA: Diagnosis not present

## 2016-11-14 DIAGNOSIS — M792 Neuralgia and neuritis, unspecified: Secondary | ICD-10-CM | POA: Diagnosis not present

## 2016-11-14 DIAGNOSIS — M79651 Pain in right thigh: Secondary | ICD-10-CM | POA: Diagnosis not present

## 2016-11-14 DIAGNOSIS — G894 Chronic pain syndrome: Secondary | ICD-10-CM | POA: Diagnosis not present

## 2016-12-28 DIAGNOSIS — R251 Tremor, unspecified: Secondary | ICD-10-CM | POA: Diagnosis not present

## 2016-12-28 DIAGNOSIS — A9239 West Nile virus infection with other complications: Secondary | ICD-10-CM | POA: Diagnosis not present

## 2017-01-10 DIAGNOSIS — M792 Neuralgia and neuritis, unspecified: Secondary | ICD-10-CM | POA: Diagnosis not present

## 2017-01-10 DIAGNOSIS — M79651 Pain in right thigh: Secondary | ICD-10-CM | POA: Diagnosis not present

## 2017-01-10 DIAGNOSIS — M79652 Pain in left thigh: Secondary | ICD-10-CM | POA: Diagnosis not present

## 2017-01-10 DIAGNOSIS — G894 Chronic pain syndrome: Secondary | ICD-10-CM | POA: Diagnosis not present

## 2017-02-19 ENCOUNTER — Encounter: Payer: Self-pay | Admitting: Internal Medicine

## 2017-02-27 DIAGNOSIS — H31001 Unspecified chorioretinal scars, right eye: Secondary | ICD-10-CM | POA: Diagnosis not present

## 2017-03-05 ENCOUNTER — Ambulatory Visit (INDEPENDENT_AMBULATORY_CARE_PROVIDER_SITE_OTHER): Payer: Medicare Other

## 2017-03-05 VITALS — BP 124/84 | HR 72 | Temp 98.3°F | Resp 16 | Ht 71.0 in | Wt 213.4 lb

## 2017-03-05 DIAGNOSIS — Z0000 Encounter for general adult medical examination without abnormal findings: Secondary | ICD-10-CM | POA: Diagnosis not present

## 2017-03-05 DIAGNOSIS — Z23 Encounter for immunization: Secondary | ICD-10-CM

## 2017-03-05 NOTE — Progress Notes (Signed)
Subjective:   Jay Ford is a 56 y.o. male who presents for Medicare Annual/Subsequent preventive examination.  Review of Systems:  N/A Cardiac Risk Factors include: advanced age (>32men, >30 women);dyslipidemia;male gender;hypertension;sedentary lifestyle     Objective:    Vitals: BP 124/84 (BP Location: Left Arm, Patient Position: Sitting, Cuff Size: Normal)   Pulse 72   Temp 98.3 F (36.8 C) (Oral)   Resp 16   Ht 5\' 11"  (1.803 m)   Wt 213 lb 6.4 oz (96.8 kg)   BMI 29.76 kg/m   Body mass index is 29.76 kg/m.  Tobacco History  Smoking Status  . Never Smoker  Smokeless Tobacco  . Never Used     Counseling given: Not Answered   Past Medical History:  Diagnosis Date  . Hyperlipidemia   . Hypertension   . Sleep apnea   . Tremors of nervous system    Right upper extremity   Past Surgical History:  Procedure Laterality Date  . HAND SURGERY  2013   carpal tunnel both hands  . ROTATOR CUFF REPAIR  2013   left   Family History  Problem Relation Age of Onset  . Cancer Father        prostate cancer  . Diabetes Father   . Heart attack Father   . Cancer Mother        breast cancer  . Diabetes Sister    History  Sexual Activity  . Sexual activity: Not on file    Outpatient Encounter Prescriptions as of 03/05/2017  Medication Sig  . DULoxetine (CYMBALTA) 20 MG capsule Take 20 mg by mouth daily.  . metoprolol succinate (TOPROL-XL) 25 MG 24 hr tablet Take 1 tablet (25 mg total) by mouth at bedtime. Take with or immediately following a meal.  . oxyCODONE (OXY IR/ROXICODONE) 5 MG immediate release tablet Take 2.5-5 mg by mouth 2 (two) times daily as needed.   No facility-administered encounter medications on file as of 03/05/2017.     Activities of Daily Living In your present state of health, do you have any difficulty performing the following activities: 03/05/2017  Hearing? N  Vision? N  Difficulty concentrating or making decisions? N  Walking or  climbing stairs? Y  Comment Pain in knees and thigh  Dressing or bathing? N  Doing errands, shopping? N  Preparing Food and eating ? N  Using the Toilet? N  In the past six months, have you accidently leaked urine? N  Do you have problems with loss of bowel control? N  Managing your Medications? N  Managing your Finances? N  Housekeeping or managing your Housekeeping? N  Some recent data might be hidden    Patient Care Team: Glean Hess, MD as PCP - General (Family Medicine) [place], Myrna Blazer, MD (Neurology) Angelia Mould, NP as Nurse Practitioner (Orthopedic Surgery)   Assessment:     Exercise Activities and Dietary recommendations Current Exercise Habits: The patient does not participate in regular exercise at present, Exercise limited by: orthopedic condition(s) (Pain in R shoulder, knees and thighs)  Goals    . Reduce portion size          Continue to eat 3 small meals per day and at least 2 snacks per day      Fall Risk Fall Risk  03/05/2017  Falls in the past year? No  Risk for fall due to : Impaired balance/gait  Risk for fall due to: Comment pain in knees and thigh  Depression Screen PHQ 2/9 Scores 03/05/2017 06/30/2015  PHQ - 2 Score 2 0  PHQ- 9 Score 9 -    Cognitive Function: declined        Immunization History  Administered Date(s) Administered  . Influenza,inj,Quad PF,6+ Mos 03/20/2016  . Influenza-Unspecified 02/20/2015  . Tdap 06/30/2015   Screening Tests Health Maintenance  Topic Date Due  . HEMOGLOBIN A1C  03/15/2017 (Originally 05/29/2015)  . HIV Screening  03/15/2017 (Originally [birth year]1976)  . COLONOSCOPY  09/15/2019  . TETANUS/TDAP  06/29/2025  . INFLUENZA VACCINE  Completed  . Hepatitis C Screening  Addressed      Plan:   I have personally reviewed and addressed the Medicare Annual Wellness questionnaire and have noted the following in the patient's chart:  A. Medical and social history B. Use of alcohol, tobacco  or illicit drugs  C. Current medications and supplements D. Functional ability and status E.  Nutritional status F.  Physical activity G. Advance directives H. List of other physicians I.  Hospitalizations, surgeries, and ER visits in previous 12 months J.  [place] such as hearing and vision if needed, cognitive and depression L. Referrals and appointments - none  In addition, I have reviewed and discussed with patient certain preventive protocols, quality metrics, and best practice recommendations. A written personalized care plan for preventive services as well as general preventive health recommendations were provided to patient.  See attached scanned questionnaire for additional information.   Signed,  Aleatha Borer, LPN Nurse Health Advisor   MD Recommendations: None. Due for HIV Screening

## 2017-03-05 NOTE — Patient Instructions (Signed)
Jay Ford , Thank you for taking time to come for your Medicare Wellness Visit. I appreciate your ongoing commitment to your health goals. Please review the following plan we discussed and let me know if I can assist you in the future.   Screening recommendations/referrals: Colonoscopy: Completed 09/15/14  Recommended yearly ophthalmology/optometry visit for glaucoma screening and checkup Recommended yearly dental visit for hygiene and checkup  Vaccinations: Influenza vaccine: Given today Pneumococcal vaccine: Not needed until after age 83 Tdap vaccine: Up to date Shingles vaccine: Declined. Please check with your insurance company regarding your out of pocket expense.    Advanced directives: Advance directive discussed with you today. I have provided a copy for you to complete at home and have notarized. Once this is complete please bring a copy in to our office so we can scan it into your chart.  Conditions/risks identified: Continue to eat 3 small meals per day and at least 2 snacks per day; Fall risk prevention discussed  Next appointment: Scheduled to see Dr. Vicente Masson on 03/15/17 @ 10:30am. Follow up in one year for your annual wellness exam.  Preventive Care 40-64 Years, Male Preventive care refers to lifestyle choices and visits with your health care provider that can promote health and wellness. What does preventive care include?  A yearly physical exam. This is also called an annual well check.  Dental exams once or twice a year.  Routine eye exams. Ask your health care provider how often you should have your eyes checked.  Personal lifestyle choices, including:  Daily care of your teeth and gums.  Regular physical activity.  Eating a healthy diet.  Avoiding tobacco and drug use.  Limiting alcohol use.  Practicing safe sex.  Taking low-dose aspirin every day starting at age 19. What happens during an annual well check? The services and screenings done by your  health care provider during your annual well check will depend on your age, overall health, lifestyle risk factors, and family history of disease. Counseling  Your health care provider may ask you questions about your:  Alcohol use.  Tobacco use.  Drug use.  Emotional well-being.  Home and relationship well-being.  Sexual activity.  Eating habits.  Work and work Statistician. Screening  You may have the following tests or measurements:  Height, weight, and BMI.  Blood pressure.  Lipid and cholesterol levels. These may be checked every 5 years, or more frequently if you are over 42 years old.  Skin check.  Lung cancer screening. You may have this screening every year starting at age 13 if you have a 30-pack-year history of smoking and currently smoke or have quit within the past 15 years.  Fecal occult blood test (FOBT) of the stool. You may have this test every year starting at age 45.  Flexible sigmoidoscopy or colonoscopy. You may have a sigmoidoscopy every 5 years or a colonoscopy every 10 years starting at age 51.  Prostate cancer screening. Recommendations will vary depending on your family history and other risks.  Hepatitis C blood test.  Hepatitis B blood test.  Sexually transmitted disease (STD) testing.  Diabetes screening. This is done by checking your blood sugar (glucose) after you have not eaten for a while (fasting). You may have this done every 1-3 years. Discuss your test results, treatment options, and if necessary, the need for more tests with your health care provider. Vaccines  Your health care provider may recommend certain vaccines, such as:  Influenza vaccine. This is recommended  every year.  Tetanus, diphtheria, and acellular pertussis (Tdap, Td) vaccine. You may need a Td booster every 10 years.  Zoster vaccine. You may need this after age 10.  Pneumococcal 13-valent conjugate (PCV13) vaccine. You may need this if you have certain  conditions and have not been vaccinated.  Pneumococcal polysaccharide (PPSV23) vaccine. You may need one or two doses if you smoke cigarettes or if you have certain conditions. Talk to your health care provider about which screenings and vaccines you need and how often you need them. This information is not intended to replace advice given to you by your health care provider. Make sure you discuss any questions you have with your health care provider. Document Released: 06/04/2015 Document Revised: 01/26/2016 Document Reviewed: 03/09/2015 Elsevier Interactive Patient Education  2017 [place] Prevention in the Home Falls can cause injuries. They can happen to people of all ages. There are many things you can do to make your home safe and to help prevent falls. What can I do on the outside of my home?  Regularly fix the edges of walkways and driveways and fix any cracks.  Remove anything that might make you trip as you walk through a door, such as a raised step or threshold.  Trim any bushes or trees on the path to your home.  Use bright outdoor lighting.  Clear any walking paths of anything that might make someone trip, such as rocks or tools.  Regularly check to see if handrails are loose or broken. Make sure that both sides of any steps have handrails.  Any raised decks and porches should have guardrails on the edges.  Have any leaves, snow, or ice cleared regularly.  Use sand or salt on walking paths during winter.  Clean up any spills in your garage right away. This includes oil or grease spills. What can I do in the bathroom?  Use night lights.  Install grab bars by the toilet and in the tub and shower. Do not use towel bars as grab bars.  Use non-skid mats or decals in the tub or shower.  If you need to sit down in the shower, use a plastic, non-slip stool.  Keep the floor dry. Clean up any water that spills on the floor as soon as it happens.  Remove soap  buildup in the tub or shower regularly.  Attach bath mats securely with double-sided non-slip rug tape.  Do not have throw rugs and other things on the floor that can make you trip. What can I do in the bedroom?  Use night lights.  Make sure that you have a light by your bed that is easy to reach.  Do not use any sheets or blankets that are too big for your bed. They should not hang down onto the floor.  Have a firm chair that has side arms. You can use this for support while you get dressed.  Do not have throw rugs and other things on the floor that can make you trip. What can I do in the kitchen?  Clean up any spills right away.  Avoid walking on wet floors.  Keep items that you use a lot in easy-to-reach places.  If you need to reach something above you, use a strong step stool that has a grab bar.  Keep electrical cords out of the way.  Do not use floor polish or wax that makes floors slippery. If you must use wax, use non-skid floor wax.  Do not have throw rugs and other things on the floor that can make you trip. What can I do with my stairs?  Do not leave any items on the stairs.  Make sure that there are handrails on both sides of the stairs and use them. Fix handrails that are broken or loose. Make sure that handrails are as long as the stairways.  Check any carpeting to make sure that it is firmly attached to the stairs. Fix any carpet that is loose or worn.  Avoid having throw rugs at the top or bottom of the stairs. If you do have throw rugs, attach them to the floor with carpet tape.  Make sure that you have a light switch at the top of the stairs and the bottom of the stairs. If you do not have them, ask someone to add them for you. What else can I do to help prevent falls?  Wear shoes that:  Do not have high heels.  Have rubber bottoms.  Are comfortable and fit you well.  Are closed at the toe. Do not wear sandals.  If you use a stepladder:  Make  sure that it is fully opened. Do not climb a closed stepladder.  Make sure that both sides of the stepladder are locked into place.  Ask someone to hold it for you, if possible.  Clearly mark and make sure that you can see:  Any grab bars or handrails.  First and last steps.  Where the edge of each step is.  Use tools that help you move around (mobility aids) if they are needed. These include:  Canes.  Walkers.  Scooters.  Crutches.  Turn on the lights when you go into a dark area. Replace any light bulbs as soon as they burn out.  Set up your furniture so you have a clear path. Avoid moving your furniture around.  If any of your floors are uneven, fix them.  If there are any pets around you, be aware of where they are.  Review your medicines with your doctor. Some medicines can make you feel dizzy. This can increase your chance of falling. Ask your doctor what other things that you can do to help prevent falls. This information is not intended to replace advice given to you by your health care provider. Make sure you discuss any questions you have with your health care provider. Document Released: 03/04/2009 Document Revised: 10/14/2015 Document Reviewed: 06/12/2014 Elsevier Interactive Patient Education  2017 [employer].

## 2017-03-15 ENCOUNTER — Encounter: Payer: Medicare Other | Admitting: Internal Medicine

## 2017-03-17 DIAGNOSIS — Z79891 Long term (current) use of opiate analgesic: Secondary | ICD-10-CM | POA: Diagnosis not present

## 2017-03-17 DIAGNOSIS — R109 Unspecified abdominal pain: Secondary | ICD-10-CM | POA: Diagnosis not present

## 2017-03-17 DIAGNOSIS — Z888 Allergy status to other drugs, medicaments and biological substances status: Secondary | ICD-10-CM | POA: Diagnosis not present

## 2017-03-17 DIAGNOSIS — Z79899 Other long term (current) drug therapy: Secondary | ICD-10-CM | POA: Diagnosis not present

## 2017-03-17 DIAGNOSIS — R1032 Left lower quadrant pain: Secondary | ICD-10-CM | POA: Diagnosis not present

## 2017-03-26 ENCOUNTER — Ambulatory Visit (INDEPENDENT_AMBULATORY_CARE_PROVIDER_SITE_OTHER): Payer: Medicare Other | Admitting: Urology

## 2017-03-26 ENCOUNTER — Encounter: Payer: Self-pay | Admitting: Urology

## 2017-03-26 VITALS — BP 139/81 | HR 60 | Ht 71.0 in | Wt 214.0 lb

## 2017-03-26 DIAGNOSIS — N200 Calculus of kidney: Secondary | ICD-10-CM | POA: Diagnosis not present

## 2017-03-26 LAB — URINALYSIS, COMPLETE
Bilirubin, UA: NEGATIVE
GLUCOSE, UA: NEGATIVE
Ketones, UA: NEGATIVE
LEUKOCYTES UA: NEGATIVE
Nitrite, UA: NEGATIVE
Protein, UA: NEGATIVE
Specific Gravity, UA: 1.015 (ref 1.005–1.030)
Urobilinogen, Ur: 0.2 mg/dL (ref 0.2–1.0)
pH, UA: 7.5 (ref 5.0–7.5)

## 2017-03-26 LAB — MICROSCOPIC EXAMINATION
Bacteria, UA: NONE SEEN
EPITHELIAL CELLS (NON RENAL): NONE SEEN /HPF (ref 0–10)
WBC, UA: NONE SEEN /hpf (ref 0–?)

## 2017-03-26 MED ORDER — TAMSULOSIN HCL 0.4 MG PO CAPS: 0.4000 mg | ORAL_CAPSULE | Freq: Two times a day (BID) | ORAL | 0 refills | Status: DC

## 2017-03-26 NOTE — Progress Notes (Signed)
03/26/2017 2:59 PM   Jay Ford [birth year] [id number]  Referring provider: Glean Hess, MD [address] [place], [place] [address]  Chief Complaint  Patient presents with  . Nephrolithiasis    HPI: Jay Ford is a 56 year old male who presents for evaluation of a left ureteral calculus with renal colic.  He presented to the [facility] ED on 03/17/2017 with acute onset of left flank pain radiating to the left lower quadrant.  There are no identifiable precipitating, aggravating or factors.  The pain was nonradiating.  He denied nausea, vomiting, fever, chills.  A stone protocol CT of the abdomen and pelvis was performed which showed a 2 mm left UVJ calculus with mild hydronephrosis.  Bilateral renal cysts were also present.  No nephrolithiasis was noted.  He denies previous history of stone disease.  He was discharged on oxycodone and tamsulosin.  His pain has resolved for the most part however he does complain of urgency, frequency and voiding small amounts.   PMH: Past Medical History:  Diagnosis Date  . Hyperlipidemia   . Hypertension   . Sleep apnea   . Tremors of nervous system    Right upper extremity  . West Nile Virus infection 2008    Surgical History: Past Surgical History:  Procedure Laterality Date  . HAND SURGERY  2013   carpal tunnel both hands  . ROTATOR CUFF REPAIR  2013   left    Home Medications:  Allergies as of 03/26/2017      Reactions   Gabapentin Other (See Comments)   Is not right   Topiramate Rash   Blistered rash on face      Medication List        Accurate as of 03/26/17  2:59 PM. Always use your most recent med list.          DULoxetine 20 MG capsule Commonly known as:  CYMBALTA Take 20 mg by mouth daily.   metoprolol succinate 25 MG 24 hr tablet Commonly known as:  TOPROL-XL Take 1 tablet (25 mg total) by mouth at bedtime. Take with or immediately following a meal.   oxyCODONE 5 MG immediate  release tablet Commonly known as:  Oxy IR/ROXICODONE Take 2.5-5 mg by mouth 2 (two) times daily as needed.   tamsulosin 0.4 MG Caps capsule Commonly known as:  FLOMAX Take 0.4 mg by mouth.       Allergies:  Allergies  Allergen Reactions  . Gabapentin Other (See Comments)    Is not right  . Topiramate Rash    Blistered rash on face    Family History: Family History  Problem Relation Age of Onset  . Cancer Father        prostate cancer  . Diabetes Father   . Heart attack Father   . Cancer Mother        breast cancer  . Diabetes Sister     Social History:  reports that  has never smoked. he has never used smokeless tobacco. He reports that he does not drink alcohol or use drugs.  ROS: UROLOGY Frequent Urination?: Yes Hard to postpone urination?: No Burning/pain with urination?: Yes Get up at night to urinate?: No Leakage of urine?: No Urine stream starts and stops?: Yes Trouble starting stream?: Yes Do you have to strain to urinate?: Yes Blood in urine?: Yes Urinary tract infection?: No Sexually transmitted disease?: No Injury to kidneys or bladder?: No Painful intercourse?: No Weak stream?: Yes Erection  problems?: No Penile pain?: No  Gastrointestinal Nausea?: No Vomiting?: No Indigestion/heartburn?: No Diarrhea?: No Constipation?: No  Constitutional Fever: No Night sweats?: No Weight loss?: No Fatigue?: No  Skin Skin rash/lesions?: No Itching?: No  Eyes Blurred vision?: No Double vision?: No  Ears/Nose/Throat Sore throat?: No Sinus problems?: No  Hematologic/Lymphatic Swollen glands?: No Easy bruising?: No  Cardiovascular Leg swelling?: No Chest pain?: No  Respiratory Cough?: No Shortness of breath?: No  Endocrine Excessive thirst?: No  Musculoskeletal Back pain?: No Joint pain?: No  Neurological Headaches?: No Dizziness?: No  Psychologic Depression?: No Anxiety?: No  Physical Exam: BP 139/81 (BP Location: Right  Arm, Patient Position: Sitting, Cuff Size: Normal)   Pulse 60   Ht 5\' 11"  (1.803 m)   Wt 214 lb (97.1 kg)   BMI 29.85 kg/m   Constitutional:  Alert and oriented, No acute distress. HEENT: [place] AT, moist mucus membranes.  Trachea midline, no masses. Cardiovascular: No clubbing, cyanosis, or edema. Respiratory: Normal respiratory effort, no increased work of breathing. GI: Abdomen is soft, nontender, nondistended, no abdominal masses GU: No CVA tenderness.  Skin: No rashes, bruises or suspicious lesions. Lymph: No cervical or inguinal adenopathy. Neurologic: Grossly intact, no focal deficits, moving all 4 extremities. Psychiatric: Normal mood and affect.  Laboratory Data: Lab Results  Component Value Date   WBC 7.4 01/19/2016   HGB 14.8 01/19/2016   HCT 43.9 01/19/2016   MCV 91 01/19/2016   PLT 213 01/19/2016    Lab Results  Component Value Date   CREATININE 1.04 01/19/2016    Urinalysis 2+ blood on dipstick/11-30 RBCs   Assessment & Plan:   1.  Left UVJ calculus Based on stone size and location he was informed there is a good chance it will pass and the decision to continue a trial of passage versus ureteroscopic removal would be based on his bother.  He is interested in another 7 days trial of passage and ureteroscopic removal if needed.  We will tentatively schedule ureteroscopy for next week. The indications and nature of the planned procedure were discussed as well as the potential  benefits and expected outcome.  Alternatives have been discussed in detail. The most common complications and side effects were discussed including but not limited to infection/sepsis; blood loss; damage to urethra, bladder, ureter, kidney; need for multiple surgeries; need for prolonged stent placement as well as general anesthesia risks. Although uncommon he was also informed of the possibility that the calculus may not be able to be treated due to inability to obtain access to the upper ureter.  In that event he would require stent placement and a follow-up procedure after a period of stent dilation. All of his questions were answered and he desires to proceed.  At the time of our visit he he did not appear to be distracted or in pain.  - Urinalysis, Complete    Abbie Sons, MD  [facility] [address], [place], [place] [address] [phone]

## 2017-03-29 ENCOUNTER — Encounter: Payer: Self-pay | Admitting: Urology

## 2017-04-06 ENCOUNTER — Telehealth: Payer: Self-pay | Admitting: Radiology

## 2017-04-06 NOTE — Telephone Encounter (Signed)
4-6 weeks

## 2017-04-06 NOTE — Telephone Encounter (Signed)
Pt states all stones have passed.  He does not have a follow up appointment scheduled. Please advise.

## 2017-04-06 NOTE — Telephone Encounter (Signed)
LMOM to return call.

## 2017-04-06 NOTE — Telephone Encounter (Signed)
-----   Message from Abbie Sons, MD sent at 04/02/2017  9:47 AM EST ----- Good morning Chelcy Bolda,  I saw this patient last week and told him if he hadn't passed his stone I could potentially look at ureteroscopy this week.  Could you give him a call and see if he wants to sched?  Thanks!

## 2017-04-09 NOTE — Telephone Encounter (Signed)
Left message with wife regarding follow up appt with Dr Bernardo Heater. Wife voices understanding.

## 2017-04-11 DIAGNOSIS — G894 Chronic pain syndrome: Secondary | ICD-10-CM | POA: Diagnosis not present

## 2017-04-11 DIAGNOSIS — M79651 Pain in right thigh: Secondary | ICD-10-CM | POA: Diagnosis not present

## 2017-04-11 DIAGNOSIS — M792 Neuralgia and neuritis, unspecified: Secondary | ICD-10-CM | POA: Diagnosis not present

## 2017-04-11 DIAGNOSIS — M79652 Pain in left thigh: Secondary | ICD-10-CM | POA: Diagnosis not present

## 2017-04-27 ENCOUNTER — Other Ambulatory Visit: Payer: Self-pay | Admitting: Internal Medicine

## 2017-04-27 DIAGNOSIS — G5713 Meralgia paresthetica, bilateral lower limbs: Secondary | ICD-10-CM

## 2017-04-27 DIAGNOSIS — A9232 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: Secondary | ICD-10-CM

## 2017-05-08 ENCOUNTER — Ambulatory Visit: Payer: Medicare Other | Admitting: Urology

## 2017-05-08 ENCOUNTER — Encounter: Payer: Self-pay | Admitting: Urology

## 2017-07-23 DIAGNOSIS — Z79899 Other long term (current) drug therapy: Secondary | ICD-10-CM | POA: Diagnosis not present

## 2017-07-23 DIAGNOSIS — M792 Neuralgia and neuritis, unspecified: Secondary | ICD-10-CM | POA: Diagnosis not present

## 2017-07-23 DIAGNOSIS — M79651 Pain in right thigh: Secondary | ICD-10-CM | POA: Diagnosis not present

## 2017-07-23 DIAGNOSIS — Z79891 Long term (current) use of opiate analgesic: Secondary | ICD-10-CM | POA: Diagnosis not present

## 2017-07-23 DIAGNOSIS — G894 Chronic pain syndrome: Secondary | ICD-10-CM | POA: Diagnosis not present

## 2017-07-23 DIAGNOSIS — Z888 Allergy status to other drugs, medicaments and biological substances status: Secondary | ICD-10-CM | POA: Diagnosis not present

## 2017-07-23 DIAGNOSIS — E663 Overweight: Secondary | ICD-10-CM | POA: Diagnosis not present

## 2017-07-23 DIAGNOSIS — M79652 Pain in left thigh: Secondary | ICD-10-CM | POA: Diagnosis not present

## 2017-07-23 DIAGNOSIS — G629 Polyneuropathy, unspecified: Secondary | ICD-10-CM | POA: Diagnosis not present

## 2017-09-28 ENCOUNTER — Ambulatory Visit: Payer: Medicare Other | Admitting: Internal Medicine

## 2017-10-11 ENCOUNTER — Ambulatory Visit: Payer: Medicare Other | Admitting: Internal Medicine

## 2017-10-11 ENCOUNTER — Ambulatory Visit (INDEPENDENT_AMBULATORY_CARE_PROVIDER_SITE_OTHER): Payer: Medicare Other | Admitting: Internal Medicine

## 2017-10-11 ENCOUNTER — Encounter: Payer: Self-pay | Admitting: Internal Medicine

## 2017-10-11 VITALS — BP 138/89 | HR 62 | Resp 16 | Ht 71.0 in | Wt 215.0 lb

## 2017-10-11 DIAGNOSIS — I10 Essential (primary) hypertension: Secondary | ICD-10-CM

## 2017-10-11 DIAGNOSIS — E782 Mixed hyperlipidemia: Secondary | ICD-10-CM

## 2017-10-11 DIAGNOSIS — A9232 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: Secondary | ICD-10-CM | POA: Diagnosis not present

## 2017-10-11 DIAGNOSIS — G4733 Obstructive sleep apnea (adult) (pediatric): Secondary | ICD-10-CM | POA: Diagnosis not present

## 2017-10-11 DIAGNOSIS — B356 Tinea cruris: Secondary | ICD-10-CM | POA: Insufficient documentation

## 2017-10-11 DIAGNOSIS — R351 Nocturia: Secondary | ICD-10-CM | POA: Diagnosis not present

## 2017-10-11 LAB — POCT URINALYSIS DIPSTICK
BILIRUBIN UA: NEGATIVE
Blood, UA: NEGATIVE
Glucose, UA: NEGATIVE
Ketones, UA: NEGATIVE
LEUKOCYTES UA: NEGATIVE
Nitrite, UA: NEGATIVE
Protein, UA: NEGATIVE
Spec Grav, UA: 1.015 (ref 1.010–1.025)
Urobilinogen, UA: 0.2 E.U./dL
pH, UA: 6.5 (ref 5.0–8.0)

## 2017-10-11 MED ORDER — FLUCONAZOLE 100 MG PO TABS: 100.0000 mg | ORAL_TABLET | Freq: Every day | ORAL | 0 refills | Status: DC

## 2017-10-11 MED ORDER — METOPROLOL SUCCINATE ER 25 MG PO TB24: 25.0000 mg | ORAL_TABLET | Freq: Every day | ORAL | 5 refills | Status: DC

## 2017-10-11 MED ORDER — DULOXETINE HCL 20 MG PO CPEP: 20.0000 mg | ORAL_CAPSULE | Freq: Every day | ORAL | 12 refills | Status: DC

## 2017-10-11 NOTE — Progress Notes (Signed)
Date:  10/11/2017   Name:  Jay Ford   DOB:  Jun 16, 1960   MRN:  [id number]   Chief Complaint: Hypertension Hypertension  This is a chronic problem. The problem is unchanged. The problem is controlled. Pertinent negatives include no chest pain, headaches, palpitations or shortness of breath. Past treatments include nothing. The current treatment provides no improvement.  Hyperlipidemia  This is a chronic problem. Associated symptoms include myalgias. Pertinent negatives include no chest pain or shortness of breath. Current antihyperlipidemic treatment includes diet change and exercise. Risk factors for coronary artery disease include hypertension.  OSA - using CPAP nightly, sleeps well and has less fatigue.  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 He ran out of cymbalta which did help and he would like to resume it.    Review of Systems  Constitutional: Negative for appetite change, chills, diaphoresis, fatigue and unexpected weight change.  HENT: Negative for hearing loss, tinnitus, trouble swallowing and voice change.   Eyes: Negative for visual disturbance.  Respiratory: Negative for choking, shortness of breath and wheezing.   Cardiovascular: Negative for chest pain, palpitations and leg swelling.  Gastrointestinal: Negative for abdominal pain, blood in stool, constipation and diarrhea.  Genitourinary: Negative for difficulty urinating, dysuria and frequency (nocturia x 2 most nights).  Musculoskeletal: Positive for myalgias. Negative for arthralgias and back pain.  Skin: Negative for color change and rash.  Neurological: Positive for numbness. Negative for dizziness, syncope and headaches.  Hematological: Negative for adenopathy.  Psychiatric/Behavioral: Negative for dysphoric mood and sleep disturbance.    Patient Active Problem List   Diagnosis Date Noted  . Pain medication agreement signed 08/09/2016  . 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 FH: prostate cancer 06/30/2015  . FH: diabetes mellitus 06/30/2015  . FH: heart disease 06/30/2015  . Obesity, Class I, BMI 30-34.9 06/30/2015  . Plantar fasciitis 10/21/2014  . Hypertension 02/13/2014  . Meralgia paresthetica of both lower extremities 02/13/2014  . Palpitations 10/09/2011  . Hyperlipidemia   . 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 Obstructive sleep apnea syndrome 02/07/2011  . Hx of hypotestosteronemia 02/07/2011  . Hx of colonic polyp 02/07/2011   Current Meds  Medication Sig  . CANNABIDIOL PO Take by mouth. Using daily for pain  . Multiple Vitamin (MULTIVITAMIN) LIQD Take 5 mLs by mouth daily.  . NON FORMULARY at bedtime. CPAP nightly  . [DISCONTINUED] Nutritional Supplements ([place]) LIQD Take by mouth. CBD OIL FOR PAIN  . [DISCONTINUED] tamsulosin (FLOMAX) 0.4 MG CAPS capsule Take 1 capsule (0.4 mg total) 2 (two) times daily by mouth.                                        Stoioff, Ronda Fairly, MD    Allergies  Allergen Reactions  . Gabapentin Other (See Comments)    Is not right  . Topiramate Rash    Blistered rash on face    Past Surgical History:  Procedure Laterality Date  . HAND SURGERY  2013   carpal tunnel both hands  . ROTATOR CUFF REPAIR  2013   left    Social History   Tobacco Use  . Smoking status: Never Smoker  . Smokeless tobacco: Never Used  Substance Use Topics  . Alcohol use: No  . Drug  use: No     Medication list has been reviewed and updated.    PHQ 2/9 Scores 10/11/2017 03/05/2017 06/30/2015  PHQ - 2 Score 0 2 0  PHQ- 9 Score - 9 -    Physical Exam  Constitutional: He is oriented to person, place, and time. He appears well-developed and well-nourished.  HENT:  Head: Normocephalic.  Right Ear: Tympanic membrane, external ear and ear canal normal.  Left Ear: Tympanic membrane, external ear and ear canal normal.  Nose: Nose  normal.  Mouth/Throat: Uvula is midline and oropharynx is clear and moist.  Eyes: Pupils are equal, round, and reactive to light. Conjunctivae and EOM are normal.  Neck: Normal range of motion. Neck supple. Carotid bruit is not present. No thyromegaly present.  Cardiovascular: Normal rate, regular rhythm, normal heart sounds and intact distal pulses.  Pulmonary/Chest: Effort normal and breath sounds normal. He has no wheezes. Right breast exhibits no mass. Left breast exhibits no mass.  Abdominal: Soft. Normal appearance and bowel sounds are normal. There is no hepatosplenomegaly. There is no tenderness.  Musculoskeletal: Normal range of motion.       Right hip: Normal.       Left hip: Normal.       Right knee: Normal.       Left knee: Normal.  Lymphadenopathy:    He has no cervical adenopathy.  Neurological: He is alert and oriented to person, place, and time. He has normal reflexes. No cranial nerve deficit.  Skin: Skin is warm, dry and intact. Rash noted. Rash is macular (in groin c/w tinea).  Psychiatric: He has a normal mood and affect. His speech is normal and behavior is normal. Judgment and thought content normal.  Nursing note and vitals reviewed.   BP 138/89   Pulse 62   Resp 16   Ht 5\' 11"  (1.803 m)   Wt 215 lb (97.5 kg)   SpO2 98%   BMI 29.99 kg/m   Assessment and Plan: 1. Essential hypertension Resume metoprolol - metoprolol succinate (TOPROL-XL) 25 MG 24 hr tablet; Take 1 tablet (25 mg total) by mouth at bedtime. Take with or immediately following a meal.  Dispense: 30 tablet; Refill: 5  2. Obstructive sleep apnea syndrome Continue CPAP  3. 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 Mixed hyperlipidemia Check labs  5. Tinea cruris - fluconazole (DIFLUCAN) 100 MG tablet; Take 1 tablet (100 mg total) by mouth daily.  Dispense: 5 tablet; Refill: 0   Meds ordered this encounter  Medications  . DULoxetine  (CYMBALTA) 20 MG capsule    Sig: Take 1 capsule (20 mg total) by mouth daily.    Dispense:  30 capsule    Refill:  12  . metoprolol succinate (TOPROL-XL) 25 MG 24 hr tablet    Sig: Take 1 tablet (25 mg total) by mouth at bedtime. Take with or immediately following a meal.    Dispense:  30 tablet    Refill:  5  . fluconazole (DIFLUCAN) 100 MG tablet    Sig: Take 1 tablet (100 mg total) by mouth daily.    Dispense:  5 tablet    Refill:  0    Partially dictated using Editor, commissioning. Any errors are unintentional.  Halina Maidens, MD [facility]  10/11/2017

## 2017-10-12 LAB — CBC WITH DIFFERENTIAL/PLATELET
BASOS: 0 %
Basophils Absolute: 0 10*3/uL (ref 0.0–0.2)
EOS (ABSOLUTE): 0.2 10*3/uL (ref 0.0–0.4)
Eos: 4 %
HEMOGLOBIN: 14.5 g/dL (ref 13.0–17.7)
Hematocrit: 43.9 % (ref 37.5–51.0)
IMMATURE GRANULOCYTES: 0 %
Immature Grans (Abs): 0 10*3/uL (ref 0.0–0.1)
LYMPHS: 34 %
Lymphocytes Absolute: 2.2 10*3/uL (ref 0.7–3.1)
MCH: 30.3 pg (ref 26.6–33.0)
MCHC: 33 g/dL (ref 31.5–35.7)
MCV: 92 fL (ref 79–97)
MONOCYTES: 9 %
MONOS ABS: 0.6 10*3/uL (ref 0.1–0.9)
NEUTROS PCT: 53 %
Neutrophils Absolute: 3.3 10*3/uL (ref 1.4–7.0)
PLATELETS: 218 10*3/uL (ref 150–450)
RBC: 4.79 x10E6/uL (ref 4.14–5.80)
RDW: 13.1 % (ref 12.3–15.4)
WBC: 6.3 10*3/uL (ref 3.4–10.8)

## 2017-10-12 LAB — COMPREHENSIVE METABOLIC PANEL
ALT: 19 IU/L (ref 0–44)
AST: 19 IU/L (ref 0–40)
Albumin/Globulin Ratio: 1.8 (ref 1.2–2.2)
Albumin: 4.8 g/dL (ref 3.5–5.5)
Alkaline Phosphatase: 84 IU/L (ref 39–117)
BUN/Creatinine Ratio: 15 (ref 9–20)
BUN: 15 mg/dL (ref 6–24)
Bilirubin Total: 0.2 mg/dL (ref 0.0–1.2)
CALCIUM: 9.5 mg/dL (ref 8.7–10.2)
CO2: 24 mmol/L (ref 20–29)
CREATININE: 0.99 mg/dL (ref 0.76–1.27)
Chloride: 103 mmol/L (ref 96–106)
GFR, EST AFRICAN AMERICAN: 98 mL/min/{1.73_m2} (ref >59)
GFR, EST NON AFRICAN AMERICAN: 85 mL/min/{1.73_m2} (ref >59)
GLUCOSE: 101 mg/dL — AB (ref 65–99)
Globulin, Total: 2.6 g/dL (ref 1.5–4.5)
Potassium: 4.8 mmol/L (ref 3.5–5.2)
Sodium: 141 mmol/L (ref 134–144)
TOTAL PROTEIN: 7.4 g/dL (ref 6.0–8.5)

## 2017-10-12 LAB — LIPID PANEL
CHOL/HDL RATIO: 3.8 ratio (ref 0.0–5.0)
Cholesterol, Total: 209 mg/dL — ABNORMAL HIGH (ref 100–199)
HDL: 55 mg/dL (ref >39)
LDL CALC: 132 mg/dL — AB (ref 0–99)
Triglycerides: 109 mg/dL (ref 0–149)
VLDL CHOLESTEROL CAL: 22 mg/dL (ref 5–40)

## 2017-10-12 LAB — PSA: PROSTATE SPECIFIC AG, SERUM: 0.7 ng/mL (ref 0.0–4.0)

## 2017-12-03 ENCOUNTER — Telehealth: Payer: Self-pay

## 2017-12-03 ENCOUNTER — Encounter: Payer: Self-pay | Admitting: Internal Medicine

## 2017-12-03 NOTE — Telephone Encounter (Signed)
Patient wife called stating patient received a letter for [employer] and wanted to call to request a note from our office stating the patient has [place] Nile Virus and cannot attend [employer] [facility] duty for that and other medical conditions.  Please Advise. CB#: [phone] Wife: Ronte Parker

## 2017-12-03 NOTE — Telephone Encounter (Signed)
Patient informed of letter written. Wife will pick up tomorrow.

## 2017-12-03 NOTE — Telephone Encounter (Signed)
I have written the letter, someone can come pick it up to take to the clerk of court.

## 2018-02-14 ENCOUNTER — Other Ambulatory Visit: Payer: Self-pay | Admitting: Internal Medicine

## 2018-02-21 ENCOUNTER — Telehealth: Payer: Self-pay | Admitting: Internal Medicine

## 2018-02-21 NOTE — Telephone Encounter (Signed)
LM to resched AWV c/b [phone] °Kathryn Brown °Care Guide ° ° °

## 2018-03-13 ENCOUNTER — Ambulatory Visit: Payer: Medicare Other

## 2018-04-10 ENCOUNTER — Ambulatory Visit (INDEPENDENT_AMBULATORY_CARE_PROVIDER_SITE_OTHER): Payer: Medicare Other

## 2018-04-10 VITALS — BP 144/92 | HR 72 | Temp 98.1°F | Resp 16 | Ht 71.0 in | Wt 223.6 lb

## 2018-04-10 DIAGNOSIS — Z23 Encounter for immunization: Secondary | ICD-10-CM | POA: Diagnosis not present

## 2018-04-10 DIAGNOSIS — Z0000 Encounter for general adult medical examination without abnormal findings: Secondary | ICD-10-CM

## 2018-04-10 NOTE — Patient Instructions (Signed)
Jay Ford , Thank you for taking time to come for your Medicare Wellness Visit. I appreciate your ongoing commitment to your health goals. Please review the following plan we discussed and let me know if I can assist you in the future.   Screening recommendations/referrals: Colonoscopy: done 09/15/14 repeat in 2021 Recommended yearly ophthalmology/optometry visit for glaucoma screening and checkup Recommended yearly dental visit for hygiene and checkup  Vaccinations: Influenza vaccine: done today Tdap vaccine: done 06/30/15 Shingles vaccine: Shingrix discussed. Please contact your insurance company for coverage information.     Advanced directives: Advance directive discussed with you today. I have provided a copy for you to complete at home and have notarized. Once this is complete please bring a copy in to our office so we can scan it into your chart.  Conditions/risks identified: Continue healthy eating habits and physical activity.   Next appointment: 04/15/18 8:40 Dr. Tina Griffiths (Flu) Vaccine (Inactivated or Recombinant): What You Need to Know 1. Why get vaccinated? Influenza ("flu") is a contagious disease that spreads around the [place] every year, usually between October and May. Flu is caused by influenza viruses, and is spread mainly by coughing, sneezing, and close contact. Anyone can get flu. Flu strikes suddenly and can last several days. Symptoms vary by age, but can include:  fever/chills  sore throat  muscle aches  fatigue  cough  headache  runny or stuffy nose  Flu can also lead to pneumonia and blood infections, and cause diarrhea and seizures in children. If you have a medical condition, such as heart or lung disease, flu can make it worse. Flu is more dangerous for some people. Infants and young children, people 1 years of age and older, pregnant women, and people with certain health conditions or a weakened immune system are at greatest  risk. Each year thousands of people in the [place] die from flu, and many more are hospitalized. Flu vaccine can:  keep you from getting flu,  make flu less severe if you do get it, and  keep you from spreading flu to your family and other people. 2. Inactivated and recombinant flu vaccines A dose of flu vaccine is recommended every flu season. Children 6 months through 18 years of age may need two doses during the same flu season. Everyone else needs only one dose each flu season. Some inactivated flu vaccines contain a very small amount of a mercury-based preservative called thimerosal. Studies have not shown thimerosal in vaccines to be harmful, but flu vaccines that do not contain thimerosal are available. There is no live flu virus in flu shots. They cannot cause the flu. There are many flu viruses, and they are always changing. Each year a new flu vaccine is made to protect against three or four viruses that are likely to cause disease in the upcoming flu season. But even when the vaccine doesn't exactly match these viruses, it may still provide some protection. Flu vaccine cannot prevent:  flu that is caused by a virus not covered by the vaccine, or  illnesses that look like flu but are not.  It takes about 2 weeks for protection to develop after vaccination, and protection lasts through the flu season. 3. Some people should not get this vaccine Tell the person who is giving you the vaccine:  If you have any severe, life-threatening allergies. If you ever had a life-threatening allergic reaction after a dose of flu vaccine, or have a severe allergy to any part of this  vaccine, you may be advised not to get vaccinated. Most, but not all, types of flu vaccine contain a small amount of egg protein.  If you ever had Guillain-Barr Syndrome (also called GBS). Some people with a history of GBS should not get this vaccine. This should be discussed with your doctor.  If you are not  feeling well. It is usually okay to get flu vaccine when you have a mild illness, but you might be asked to come back when you feel better.  4. Risks of a vaccine reaction With any medicine, including vaccines, there is a chance of reactions. These are usually mild and go away on their own, but serious reactions are also possible. Most people who get a flu shot do not have any problems with it. Minor problems following a flu shot include:  soreness, redness, or swelling where the shot was given  hoarseness  sore, red or itchy eyes  cough  fever  aches  headache  itching  fatigue  If these problems occur, they usually begin soon after the shot and last 1 or 2 days. More serious problems following a flu shot can include the following:  There may be a small increased risk of Guillain-Barre Syndrome (GBS) after inactivated flu vaccine. This risk has been estimated at 1 or 2 additional cases per million people vaccinated. This is much lower than the risk of severe complications from flu, which can be prevented by flu vaccine.  Young children who get the flu shot along with pneumococcal vaccine (PCV13) and/or DTaP vaccine at the same time might be slightly more likely to have a seizure caused by fever. Ask your doctor for more information. Tell your doctor if a child who is getting flu vaccine has ever had a seizure.  Problems that could happen after any injected vaccine:  People sometimes faint after a medical procedure, including vaccination. Sitting or lying down for about 15 minutes can help prevent fainting, and injuries caused by a fall. Tell your doctor if you feel dizzy, or have vision changes or ringing in the ears.  Some people get severe pain in the shoulder and have difficulty moving the arm where a shot was given. This happens very rarely.  Any medication can cause a severe allergic reaction. Such reactions from a vaccine are very rare, estimated at about 1 in a million  doses, and would happen within a few minutes to a few hours after the vaccination. As with any medicine, there is a very remote chance of a vaccine causing a serious injury or death. The safety of vaccines is always being monitored. For more information, visit: http://www.aguilar.org/ 5. What if there is a serious reaction? What should I look for? Look for anything that concerns you, such as signs of a severe allergic reaction, very high fever, or unusual behavior. Signs of a severe allergic reaction can include hives, swelling of the face and throat, difficulty breathing, a fast heartbeat, dizziness, and weakness. These would start a few minutes to a few hours after the vaccination. What should I do?  If you think it is a severe allergic reaction or other emergency that can't wait, call 9-1-1 and get the person to the nearest hospital. Otherwise, call your doctor.  Reactions should be reported to the Vaccine Adverse Event Reporting System (VAERS). Your doctor should file this report, or you can do it yourself through the VAERS web site at www.vaers.SamedayNews.es, or by calling 331-417-2771. ? VAERS does not give medical advice.  6. The National Vaccine Injury Compensation Program The [employer] Vaccine Injury Compensation Program (VICP) is a federal program that was created to compensate people who may have been injured by certain vaccines. Persons who believe they may have been injured by a vaccine can learn about the program and about filing a claim by calling 213-204-8255 or visiting the [place] website at GoldCloset.com.ee. There is a time limit to file a claim for compensation. 7. How can I learn more?  Ask your healthcare provider. He or she can give you the vaccine package insert or suggest other sources of information.  Call your local or state health department.  Contact the Centers for Disease Control and Prevention (CDC): ? Call 269-597-0114 (1-800-CDC-INFO) or ? Visit  CDC's website at https://gibson.com/ Vaccine Information Statement, Inactivated Influenza Vaccine (12/26/2013) This information is not intended to replace advice given to you by your health care provider. Make sure you discuss any questions you have with your health care provider. Document Released: 03/02/2006 Document Revised: 01/27/2016 Document Reviewed: 01/27/2016 Elsevier Interactive Patient Education  2017 [place] Years, Male Preventive care refers to lifestyle choices and visits with your health care provider that can promote health and wellness. What does preventive care include?  A yearly physical exam. This is also called an annual well check.  Dental exams once or twice a year.  Routine eye exams. Ask your health care provider how often you should have your eyes checked.  Personal lifestyle choices, including:  Daily care of your teeth and gums.  Regular physical activity.  Eating a healthy diet.  Avoiding tobacco and drug use.  Limiting alcohol use.  Practicing safe sex.  Taking low-dose aspirin every day starting at age 11. What happens during an annual well check? The services and screenings done by your health care provider during your annual well check will depend on your age, overall health, lifestyle risk factors, and family history of disease. Counseling  Your health care provider may ask you questions about your:  Alcohol use.  Tobacco use.  Drug use.  Emotional well-being.  Home and relationship well-being.  Sexual activity.  Eating habits.  Work and work Statistician. Screening  You may have the following tests or measurements:  Height, weight, and BMI.  Blood pressure.  Lipid and cholesterol levels. These may be checked every 5 years, or more frequently if you are over 6 years old.  Skin check.  Lung cancer screening. You may have this screening every year starting at age 65 if you have a 30-pack-year  history of smoking and currently smoke or have quit within the past 15 years.  Fecal occult blood test (FOBT) of the stool. You may have this test every year starting at age 76.  Flexible sigmoidoscopy or colonoscopy. You may have a sigmoidoscopy every 5 years or a colonoscopy every 10 years starting at age 99.  Prostate cancer screening. Recommendations will vary depending on your family history and other risks.  Hepatitis C blood test.  Hepatitis B blood test.  Sexually transmitted disease (STD) testing.  Diabetes screening. This is done by checking your blood sugar (glucose) after you have not eaten for a while (fasting). You may have this done every 1-3 years. Discuss your test results, treatment options, and if necessary, the need for more tests with your health care provider. Vaccines  Your health care provider may recommend certain vaccines, such as:  Influenza vaccine. This is recommended every year.  Tetanus, diphtheria, and  acellular pertussis (Tdap, Td) vaccine. You may need a Td booster every 10 years.  Zoster vaccine. You may need this after age 35.  Pneumococcal 13-valent conjugate (PCV13) vaccine. You may need this if you have certain conditions and have not been vaccinated.  Pneumococcal polysaccharide (PPSV23) vaccine. You may need one or two doses if you smoke cigarettes or if you have certain conditions. Talk to your health care provider about which screenings and vaccines you need and how often you need them. This information is not intended to replace advice given to you by your health care provider. Make sure you discuss any questions you have with your health care provider. Document Released: 06/04/2015 Document Revised: 01/26/2016 Document Reviewed: 03/09/2015 Elsevier Interactive Patient Education  2017 [place] Prevention in the Home Falls can cause injuries. They can happen to people of all ages. There are many things you can do to make your  home safe and to help prevent falls. What can I do on the outside of my home?  Regularly fix the edges of walkways and driveways and fix any cracks.  Remove anything that might make you trip as you walk through a door, such as a raised step or threshold.  Trim any bushes or trees on the path to your home.  Use bright outdoor lighting.  Clear any walking paths of anything that might make someone trip, such as rocks or tools.  Regularly check to see if handrails are loose or broken. Make sure that both sides of any steps have handrails.  Any raised decks and porches should have guardrails on the edges.  Have any leaves, snow, or ice cleared regularly.  Use sand or salt on walking paths during winter.  Clean up any spills in your garage right away. This includes oil or grease spills. What can I do in the bathroom?  Use night lights.  Install grab bars by the toilet and in the tub and shower. Do not use towel bars as grab bars.  Use non-skid mats or decals in the tub or shower.  If you need to sit down in the shower, use a plastic, non-slip stool.  Keep the floor dry. Clean up any water that spills on the floor as soon as it happens.  Remove soap buildup in the tub or shower regularly.  Attach bath mats securely with double-sided non-slip rug tape.  Do not have throw rugs and other things on the floor that can make you trip. What can I do in the bedroom?  Use night lights.  Make sure that you have a light by your bed that is easy to reach.  Do not use any sheets or blankets that are too big for your bed. They should not hang down onto the floor.  Have a firm chair that has side arms. You can use this for support while you get dressed.  Do not have throw rugs and other things on the floor that can make you trip. What can I do in the kitchen?  Clean up any spills right away.  Avoid walking on wet floors.  Keep items that you use a lot in easy-to-reach places.  If  you need to reach something above you, use a strong step stool that has a grab bar.  Keep electrical cords out of the way.  Do not use floor polish or wax that makes floors slippery. If you must use wax, use non-skid floor wax.  Do not have throw rugs and  other things on the floor that can make you trip. What can I do with my stairs?  Do not leave any items on the stairs.  Make sure that there are handrails on both sides of the stairs and use them. Fix handrails that are broken or loose. Make sure that handrails are as long as the stairways.  Check any carpeting to make sure that it is firmly attached to the stairs. Fix any carpet that is loose or worn.  Avoid having throw rugs at the top or bottom of the stairs. If you do have throw rugs, attach them to the floor with carpet tape.  Make sure that you have a light switch at the top of the stairs and the bottom of the stairs. If you do not have them, ask someone to add them for you. What else can I do to help prevent falls?  Wear shoes that:  Do not have high heels.  Have rubber bottoms.  Are comfortable and fit you well.  Are closed at the toe. Do not wear sandals.  If you use a stepladder:  Make sure that it is fully opened. Do not climb a closed stepladder.  Make sure that both sides of the stepladder are locked into place.  Ask someone to hold it for you, if possible.  Clearly mark and make sure that you can see:  Any grab bars or handrails.  First and last steps.  Where the edge of each step is.  Use tools that help you move around (mobility aids) if they are needed. These include:  Canes.  Walkers.  Scooters.  Crutches.  Turn on the lights when you go into a dark area. Replace any light bulbs as soon as they burn out.  Set up your furniture so you have a clear path. Avoid moving your furniture around.  If any of your floors are uneven, fix them.  If there are any pets around you, be aware of where  they are.  Review your medicines with your doctor. Some medicines can make you feel dizzy. This can increase your chance of falling. Ask your doctor what other things that you can do to help prevent falls. This information is not intended to replace advice given to you by your health care provider. Make sure you discuss any questions you have with your health care provider. Document Released: 03/04/2009 Document Revised: 10/14/2015 Document Reviewed: 06/12/2014 Elsevier Interactive Patient Education  2017 [employer].

## 2018-04-10 NOTE — Progress Notes (Signed)
Subjective:   Jay Ford is a 57 y.o. male who presents for Medicare Annual/Subsequent preventive examination.  Review of Systems:   Cardiac Risk Factors include: advanced age (>46men, >96 women);hypertension;obesity (BMI >30kg/m2)     Objective:    Vitals: BP (!) 150/92 (BP Location: Left Arm, Patient Position: Sitting, Cuff Size: Normal)   Pulse 72   Temp 98.1 F (36.7 C) (Oral)   Resp 16   Ht 5\' 11"  (1.803 m)   Wt 223 lb 9.6 oz (101.4 kg)   BMI 31.19 kg/m   Body mass index is 31.19 kg/m.  Advanced Directives 04/10/2018 03/05/2017  Does Patient Have a Medical Advance Directive? No No  Does patient want to make changes to medical advance directive? - Yes (MAU/Ambulatory/Procedural Areas - Information given)  Would patient like information on creating a medical advance directive? Yes (MAU/Ambulatory/Procedural Areas - Information given) -    Tobacco Social History   Tobacco Use  Smoking Status Never Smoker  Smokeless Tobacco Never Used     Counseling given: Not Answered   Clinical Intake:  Pre-visit preparation completed: Yes  Pain : 0-10 Pain Score: 5  Pain Type: Chronic pain Pain Location: Leg Pain Orientation: Right Pain Descriptors / Indicators: Tingling Pain Onset: More than a month ago(right upper leg and right shoulder neuropathic pain) Pain Frequency: Constant Pain Relieving Factors: medication  Pain Relieving Factors: medication  Nutritional Status: BMI > 30  Obese Diabetes: No  How often do you need to have someone help you when you read instructions, pamphlets, or other written materials from your doctor or pharmacy?: 1 - Never What is the last grade level you completed in school?: 12th grade  Interpreter Needed?: No  Information entered by :: Clemetine Marker LPN  Past Medical History:  Diagnosis Date  . Hyperlipidemia   . Hypertension   . Sleep apnea   . Tremors of nervous system    Right upper extremity  . West Nile Virus  infection 2008   Past Surgical History:  Procedure Laterality Date  . HAND SURGERY  2013   carpal tunnel both hands  . ROTATOR CUFF REPAIR  2013   left   Family History  Problem Relation Age of Onset  . Cancer Father        prostate cancer  . Diabetes Father   . Heart attack Father   . Cancer Mother        breast cancer  . Diabetes Sister    Social History   Socioeconomic History  . Marital status: Married    Spouse name: Not on file  . Number of children: 2  . Years of education: Not on file  . Highest education level: High school graduate  Occupational History  . Occupation: parent    Comment: on [place]  . Financial resource strain: Not hard at all  . Food insecurity:    Worry: Never true    Inability: Never true  . Transportation needs:    Medical: No    Non-medical: No  Tobacco Use  . Smoking status: Never Smoker  . Smokeless tobacco: Never Used  Substance and Sexual Activity  . Alcohol use: No  . Drug use: No  . Sexual activity: Not on file  Lifestyle  . Physical activity:    Days per week: 4 days    Minutes per session: 20 min  . Stress: Not on file  Relationships  . Social connections:    Talks on  phone: More than three times a week    Gets together: More than three times a week    Attends religious service: More than 4 times per year    Active member of club or organization: No    Attends meetings of clubs or organizations: Never    Relationship status: Married  Other Topics Concern  . Not on file  Social History Narrative  . Not on file    Outpatient Encounter Medications as of 04/10/2018  Medication Sig  . CANNABIDIOL PO Take by mouth. Using daily for pain  . DULoxetine (CYMBALTA) 20 MG capsule TAKE 1 CAPSULE BY MOUTH EVERY DAY  . metoprolol succinate (TOPROL-XL) 25 MG 24 hr tablet Take 1 tablet (25 mg total) by mouth at bedtime. Take with or immediately following a meal.  . Multiple Vitamin (MULTIVITAMIN) LIQD Take 5  mLs by mouth daily.  . NON FORMULARY at bedtime. CPAP nightly  . fluconazole (DIFLUCAN) 100 MG tablet Take 1 tablet (100 mg total) by mouth daily.   No facility-administered encounter medications on file as of 04/10/2018.     Activities of Daily Living In your present state of health, do you have any difficulty performing the following activities: 04/10/2018  Hearing? N  Comment declines hearing aids  Vision? N  Comment wears glasses  Difficulty concentrating or making decisions? N  Walking or climbing stairs? Y  Comment neuropathic leg pain due to hx of west nile virus  Dressing or bathing? N  Doing errands, shopping? N  Preparing Food and eating ? N  Using the Toilet? N  In the past six months, have you accidently leaked urine? N  Do you have problems with loss of bowel control? N  Managing your Medications? N  Managing your Finances? N  Housekeeping or managing your Housekeeping? N  Some recent data might be hidden    Patient Care Team: Glean Hess, MD as PCP - General (Family Medicine) Smithson, Myrna Blazer, MD (Neurology) Angelia Mould, NP as Nurse Practitioner (Orthopedic Surgery)   Assessment:   This is a routine wellness examination for [place].  Exercise Activities and Dietary recommendations Current Exercise Habits: Home exercise routine, Type of exercise: walking, Time (Minutes): 20, Frequency (Times/Week): 4, Weekly Exercise (Minutes/Week): 80, Intensity: Mild, Exercise limited by: neurologic condition(s)  Goals    . Reduce portion size     Continue to eat 3 small meals per day and at least 2 snacks per day       Fall Risk Fall Risk  04/10/2018 10/11/2017 03/05/2017  Falls in the past year? 0 No No  Risk for fall due to : - - Impaired balance/gait  Risk for fall due to: Comment - - pain in knees and thigh   FALL RISK PREVENTION PERTAINING TO THE HOME:  Any stairs in or around the home WITH handrails? No  Home free of loose throw rugs in  walkways, pet beds, electrical cords, etc? Yes  Adequate lighting in your home to reduce risk of falls? Yes   ASSISTIVE DEVICES UTILIZED TO PREVENT FALLS:  Life alert? No  Use of a cane, walker or w/c? No  Grab bars in the bathroom? Yes  Shower chair or bench in shower? No  Elevated toilet seat or a handicapped toilet? No   DME ORDERS:  DME order needed?  No   TIMED UP AND GO:  Was the test performed? Yes .  Length of time to ambulate 10 feet: 6 sec.   GAIT:  Appearance of gait: Gait stead-fast and without the use of an assistive device.   Education: Fall risk prevention has been discussed.  Intervention(s) required? No    Depression Screen PHQ 2/9 Scores 04/10/2018 10/11/2017 03/05/2017 06/30/2015  PHQ - 2 Score 0 0 2 0  PHQ- 9 Score - - 9 -    Cognitive Function pt declined 6CIT        Immunization History  Administered Date(s) Administered  . Influenza,inj,Quad PF,6+ Mos 03/20/2016, 03/05/2017  . Influenza-Unspecified 02/20/2015  . Tdap 06/30/2015    Qualifies for Shingles Vaccine? Yes . Due for Shingrix. Education has been provided regarding the importance of this vaccine. Pt has been advised to call insurance company to determine out of pocket expense. Advised may also receive vaccine at local pharmacy or Health Dept. Verbalized acceptance and understanding.  Tdap: Up to date  Flu Vaccine: Due for Flu vaccine. Does the patient want to receive this vaccine today?  Yes .   Pneumococcal Vaccine: Due age 58  Screening Tests Health Maintenance  Topic Date Due  . INFLUENZA VACCINE  12/20/2017  . HIV Screening  10/12/2018 (Originally 07/02/1976)  . COLONOSCOPY  09/15/2019  . TETANUS/TDAP  06/29/2025  . Hepatitis C Screening  Addressed  . HEMOGLOBIN A1C  Discontinued   Cancer Screenings:  Colorectal Screening: Completed 09/15/14. Repeat every 5 years;   Lung Cancer Screening: (Low Dose CT Chest recommended if Age 24-80 years, 30 pack-year currently smoking  OR have quit w/in 15years.) does not qualify.    Additional Screening:  Hepatitis C Screening: does qualify; Completed 10/71/16  Vision Screening: Recommended annual ophthalmology exams for early detection of glaucoma and other disorders of the eye. Is the patient up to date with their annual eye exam?  Yes  Who is the provider or what is the name of the office in which the pt attends annual eye exams? [place] Screening: Recommended annual dental exams for proper oral hygiene  Community Resource Referral:  CRR required this visit?  No       Plan:    I have personally reviewed and addressed the Medicare Annual Wellness questionnaire and have noted the following in the patient's chart:  A. Medical and social history B. Use of alcohol, tobacco or illicit drugs  C. Current medications and supplements D. Functional ability and status E.  Nutritional status F.  Physical activity G. Advance directives H. List of other physicians I.  Hospitalizations, surgeries, and ER visits in previous 12 months J.  [place] such as hearing and vision if needed, cognitive and depression L. Referrals and appointments   In addition, I have reviewed and discussed with patient certain preventive protocols, quality metrics, and best practice recommendations. A written personalized care plan for preventive services as well as general preventive health recommendations were provided to patient.   Signed,  Clemetine Marker, LPN Nurse Health Advisor   Nurse Notes: pt's BP slightly elevated during visit 150/92, recheck at 144/92 pt has 6 month f/u for HTN scheduled with Dr. Army Melia on Monday 04/15/18.

## 2018-04-15 ENCOUNTER — Ambulatory Visit (INDEPENDENT_AMBULATORY_CARE_PROVIDER_SITE_OTHER): Payer: Medicare Other | Admitting: Internal Medicine

## 2018-04-15 ENCOUNTER — Encounter: Payer: Self-pay | Admitting: Internal Medicine

## 2018-04-15 VITALS — BP 124/82 | HR 78 | Ht 71.0 in | Wt 223.0 lb

## 2018-04-15 DIAGNOSIS — I10 Essential (primary) hypertension: Secondary | ICD-10-CM

## 2018-04-15 DIAGNOSIS — A9232 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: Secondary | ICD-10-CM | POA: Diagnosis not present

## 2018-04-15 MED ORDER — DULOXETINE HCL 20 MG PO CPEP: 20.0000 mg | ORAL_CAPSULE | Freq: Every day | ORAL | 1 refills | Status: DC

## 2018-04-15 MED ORDER — METOPROLOL SUCCINATE ER 25 MG PO TB24: 25.0000 mg | ORAL_TABLET | Freq: Every day | ORAL | 1 refills | Status: DC

## 2018-04-15 NOTE — Progress Notes (Signed)
Date:  04/15/2018   Name:  Jay Ford   DOB:  1960-08-17   MRN:  [id number]   Chief Complaint: Hypertension (follow up)  Hypertension  This is a chronic problem. The problem is controlled (it tends to go up when he is in pain). Associated symptoms include headaches. Pertinent negatives include no chest pain, neck pain, palpitations, peripheral edema or shortness of breath. Past treatments include beta blockers (resumed last visit). The current treatment provides significant improvement.   Lab Results  Component Value Date   CREATININE 0.99 10/11/2017   BUN 15 10/11/2017   NA 141 10/11/2017   K 4.8 10/11/2017   CL 103 10/11/2017   CO2 24 10/11/2017     Review of Systems  Constitutional: Positive for fatigue. Negative for chills and fever.  Respiratory: Negative for shortness of breath.   Cardiovascular: Negative for chest pain and palpitations.  Gastrointestinal: Negative for abdominal pain, constipation and diarrhea.  Musculoskeletal: Positive for myalgias. Negative for neck pain.  Allergic/Immunologic: Negative for environmental allergies.  Neurological: Positive for weakness and headaches. Negative for dizziness, seizures and numbness.  Psychiatric/Behavioral: Negative for sleep disturbance.    Patient Active Problem List   Diagnosis Date Noted  . Nocturia 10/11/2017  . Tinea cruris 10/11/2017  . Pain medication agreement signed 08/09/2016  . 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 FH: prostate cancer 06/30/2015  . FH: diabetes mellitus 06/30/2015  . FH: heart disease 06/30/2015  . Obesity, Class I, BMI 30-34.9 06/30/2015  . Plantar fasciitis 10/21/2014  . Essential hypertension 02/13/2014  . Meralgia paresthetica of both lower extremities 02/13/2014  . Palpitations 10/09/2011  . Hyperlipidemia   . 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 Obstructive sleep apnea syndrome 02/07/2011  . Hx of hypotestosteronemia 02/07/2011    . Hx of colonic polyp 02/07/2011    Allergies  Allergen Reactions  . Gabapentin Other (See Comments)    Is not right  . Topiramate Rash    Blistered rash on face    Past Surgical History:  Procedure Laterality Date  . HAND SURGERY  2013   carpal tunnel both hands  . ROTATOR CUFF REPAIR  2013   left    Social History   Tobacco Use  . Smoking status: Never Smoker  . Smokeless tobacco: Never Used  Substance Use Topics  . Alcohol use: No  . Drug use: No     Medication list has been reviewed and updated.  Current Meds  Medication Sig  . CANNABIDIOL PO Take by mouth. Using daily for pain  . DULoxetine (CYMBALTA) 20 MG capsule TAKE 1 CAPSULE BY MOUTH EVERY DAY  . metoprolol succinate (TOPROL-XL) 25 MG 24 hr tablet Take 1 tablet (25 mg total) by mouth at bedtime. Take with or immediately following a meal.  . Multiple Vitamin (MULTIVITAMIN) LIQD Take 5 mLs by mouth daily.  . NON FORMULARY at bedtime. CPAP nightly  . [DISCONTINUED] fluconazole (DIFLUCAN) 100 MG tablet Take 1 tablet (100 mg total) by mouth daily.    PHQ 2/9 Scores 04/15/2018 04/10/2018 10/11/2017 03/05/2017  PHQ - 2 Score 0 0 0 2  PHQ- 9 Score 1 - - 9    Physical Exam  Constitutional: He is oriented to person, place, and time. He appears well-developed. No distress.  HENT:  Head: Normocephalic and atraumatic.  Right Ear: Tympanic membrane, external ear and ear canal normal.  Left Ear: Tympanic membrane, external ear and ear canal normal.  Eyes: Pupils  are equal, round, and reactive to light.  Neck: Normal range of motion. Neck supple. Carotid bruit is not present.  Cardiovascular: Normal rate, regular rhythm and normal heart sounds.  Pulmonary/Chest: Effort normal and breath sounds normal. No respiratory distress.  Musculoskeletal: Normal range of motion.  Lymphadenopathy:    He has no cervical adenopathy.  Neurological: He is alert and oriented to person, place, and time.  Skin: Skin is warm and  dry. No rash noted.  Psychiatric: He has a normal mood and affect. His speech is normal and behavior is normal. Thought content normal.  Nursing note and vitals reviewed.   BP 124/82   Pulse 78   Ht 5\' 11"  (1.803 m)   Wt 223 lb (101.2 kg)   BMI 31.10 kg/m   Assessment and Plan: 1. Essential hypertension Controlled Continue to watch sodium and fat in diet - metoprolol succinate (TOPROL-XL) 25 MG 24 hr tablet; Take 1 tablet (25 mg total) by mouth at bedtime. Take with or immediately following a meal.  Dispense: 90 tablet; Refill: 1  2. 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  Dispense: 90 capsule; Refill: 1   Partially dictated using Editor, commissioning. Any errors are unintentional.  Halina Maidens, MD [facility]  04/15/2018

## 2018-10-17 ENCOUNTER — Encounter: Payer: Medicare Other | Admitting: Internal Medicine

## 2018-12-26 ENCOUNTER — Other Ambulatory Visit: Payer: Self-pay | Admitting: Internal Medicine

## 2018-12-26 DIAGNOSIS — A9232 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: Secondary | ICD-10-CM

## 2019-01-09 ENCOUNTER — Other Ambulatory Visit: Payer: Self-pay | Admitting: Internal Medicine

## 2019-01-09 DIAGNOSIS — A9232 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: Secondary | ICD-10-CM

## 2019-03-20 ENCOUNTER — Telehealth: Payer: Self-pay | Admitting: Internal Medicine

## 2019-03-20 NOTE — Telephone Encounter (Signed)
Called to schedule Medicare Annual Wellness Visit with Nurse Health Advisor, Clemetine Marker at [facility]. If patient returns call, please schedule AWV with [facility] ~ After 04/10/18 on [facility] schedule (Mon or Wed)  Questions regarding scheduling, please call  [phone] or Skype > [email or web address]   [place]  ??[email or web address]   ??[phone]   1Skype

## 2019-03-28 NOTE — Telephone Encounter (Signed)
2nd attempt [id number]

## 2019-08-27 ENCOUNTER — Other Ambulatory Visit: Payer: Self-pay | Admitting: Internal Medicine

## 2019-08-27 DIAGNOSIS — A9232 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: Secondary | ICD-10-CM

## 2019-09-08 ENCOUNTER — Ambulatory Visit (INDEPENDENT_AMBULATORY_CARE_PROVIDER_SITE_OTHER): Payer: Medicare Other | Admitting: Internal Medicine

## 2019-09-08 ENCOUNTER — Other Ambulatory Visit: Payer: Self-pay

## 2019-09-08 ENCOUNTER — Encounter: Payer: Self-pay | Admitting: Internal Medicine

## 2019-09-08 VITALS — BP 136/92 | HR 65 | Temp 97.5°F | Ht 71.0 in | Wt 221.0 lb

## 2019-09-08 DIAGNOSIS — G5713 Meralgia paresthetica, bilateral lower limbs: Secondary | ICD-10-CM

## 2019-09-08 DIAGNOSIS — I10 Essential (primary) hypertension: Secondary | ICD-10-CM | POA: Diagnosis not present

## 2019-09-08 DIAGNOSIS — Z0000 Encounter for general adult medical examination without abnormal findings: Secondary | ICD-10-CM | POA: Diagnosis not present

## 2019-09-08 DIAGNOSIS — G4733 Obstructive sleep apnea (adult) (pediatric): Secondary | ICD-10-CM

## 2019-09-08 DIAGNOSIS — Z125 Encounter for screening for malignant neoplasm of prostate: Secondary | ICD-10-CM

## 2019-09-08 DIAGNOSIS — E782 Mixed hyperlipidemia: Secondary | ICD-10-CM

## 2019-09-08 DIAGNOSIS — R351 Nocturia: Secondary | ICD-10-CM | POA: Diagnosis not present

## 2019-09-08 DIAGNOSIS — Z86010 Personal history of colonic polyps: Secondary | ICD-10-CM

## 2019-09-08 LAB — POCT URINALYSIS DIPSTICK
Bilirubin, UA: NEGATIVE
Blood, UA: NEGATIVE
Glucose, UA: NEGATIVE
Ketones, UA: NEGATIVE
Leukocytes, UA: NEGATIVE
Nitrite, UA: NEGATIVE
Protein, UA: NEGATIVE
Spec Grav, UA: 1.015 (ref 1.010–1.025)
Urobilinogen, UA: 0.2 E.U./dL
pH, UA: 6 (ref 5.0–8.0)

## 2019-09-08 MED ORDER — LOSARTAN POTASSIUM 50 MG PO TABS: 50.0000 mg | ORAL_TABLET | Freq: Every day | ORAL | 3 refills | Status: DC

## 2019-09-08 MED ORDER — METOPROLOL SUCCINATE ER 25 MG PO TB24: 25.0000 mg | ORAL_TABLET | Freq: Every day | ORAL | 1 refills | Status: DC

## 2019-09-08 NOTE — Patient Instructions (Signed)
Increase Duloxetine to 2 capsules daily.  After several weeks, if there is benefit, call me for the new prescription of 2 per day.

## 2019-09-08 NOTE — Progress Notes (Signed)
Date:  09/08/2019   Name:  Jay Ford   DOB:  May 14, 1961   MRN:  [id number]   Chief Complaint: Annual Exam Jay Ford is a 59 y.o. male who presents today for his Complete Annual Exam. He feels fairly well. He reports exercising rarely. He reports he is sleeping fairly well.   Colonoscopy 2016 - TA repeat 5 yrs Immunization History  Administered Date(s) Administered  . Influenza,inj,Quad PF,6+ Mos 03/20/2016, 03/05/2017, 04/10/2018  . Influenza-Unspecified 02/20/2015  . Tdap 06/30/2015    Hypertension This is a chronic problem. The problem is controlled. Pertinent negatives include no chest pain, headaches, palpitations or shortness of breath. Past treatments include beta blockers. The current treatment provides significant improvement.  Hyperlipidemia Pertinent negatives include no chest pain, myalgias or shortness of breath.  Neurologic sequelae of West Nile Virus - he was followed by pain management.  He takes cymbalta now and uses CBD products.  He takes tylenol or advil several times a week as needed. His thigh pain is slightly worse but he can still deal with it.  OSA - using CPAP nightly.  He feels like he sleeps well and is refreshed.  No daytime somnolence or headaches.  Lab Results  Component Value Date   CREATININE 0.99 10/11/2017   BUN 15 10/11/2017   NA 141 10/11/2017   K 4.8 10/11/2017   CL 103 10/11/2017   CO2 24 10/11/2017   Lab Results  Component Value Date   CHOL 209 (H) 10/11/2017   HDL 55 10/11/2017   LDLCALC 132 (H) 10/11/2017   TRIG 109 10/11/2017   CHOLHDL 3.8 10/11/2017   Lab Results  Component Value Date   TSH 0.954 01/19/2016   No results found for: HGBA1C Lab Results  Component Value Date   WBC 6.3 10/11/2017   HGB 14.5 10/11/2017   HCT 43.9 10/11/2017   MCV 92 10/11/2017   PLT 218 10/11/2017   Lab Results  Component Value Date   ALT 19 10/11/2017   AST 19 10/11/2017   ALKPHOS 84 10/11/2017   BILITOT 0.2 10/11/2017      Review of Systems  Constitutional: Negative for appetite change, chills, diaphoresis, fatigue and unexpected weight change.  HENT: Negative for hearing loss, tinnitus, trouble swallowing and voice change.   Eyes: Negative for visual disturbance.  Respiratory: Negative for choking, shortness of breath and wheezing.   Cardiovascular: Negative for chest pain, palpitations and leg swelling.  Gastrointestinal: Negative for abdominal pain, blood in stool, constipation and diarrhea.  Genitourinary: Negative for difficulty urinating, dysuria and frequency.  Musculoskeletal: Negative for arthralgias, back pain and myalgias.  Skin: Negative for color change and rash.  Neurological: Negative for dizziness, syncope and headaches.  Hematological: Negative for adenopathy.  Psychiatric/Behavioral: Negative for dysphoric mood and sleep disturbance.    Patient Active Problem List   Diagnosis Date Noted  . Nocturia 10/11/2017  . Tinea cruris 10/11/2017  . Pain medication agreement signed 08/09/2016  . 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 Obesity, Class I, BMI 30-34.9 06/30/2015  . Plantar fasciitis 10/21/2014  . Essential hypertension 02/13/2014  . Meralgia paresthetica of both lower extremities 02/13/2014  . Palpitations 10/09/2011  . Hyperlipidemia   . 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 Obstructive sleep apnea syndrome 02/07/2011  . Hx of hypotestosteronemia 02/07/2011  . Hx of colonic polyp 02/07/2011    Allergies  Allergen Reactions  . Gabapentin Other (See Comments)    Is not right  .  Topiramate Rash    Blistered rash on face    Past Surgical History:  Procedure Laterality Date  . HAND SURGERY  2013   carpal tunnel both hands  . ROTATOR CUFF REPAIR  2013   left    Social History   Tobacco Use  . Smoking status: Never Smoker  . Smokeless tobacco: Never Used  Substance Use Topics  . Alcohol use: No  . Drug use: No      Medication list has been reviewed and updated.  Current Meds  Medication Sig  . CANNABIDIOL PO Take by mouth. Using daily for pain  . DULoxetine (CYMBALTA) 20 MG capsule TAKE 1 CAPSULE BY MOUTH EVERY DAY  . metoprolol succinate (TOPROL-XL) 25 MG 24 hr tablet Take 1 tablet (25 mg total) by mouth at bedtime. Take with or immediately following a meal.  . Multiple Vitamin (MULTIVITAMIN) LIQD Take 5 mLs by mouth daily.  . NON FORMULARY at bedtime. CPAP nightly    PHQ 2/9 Scores 09/08/2019 04/15/2018 04/10/2018 10/11/2017  PHQ - 2 Score 0 0 0 0  PHQ- 9 Score 0 1 - -    BP Readings from Last 3 Encounters:  09/08/19 (!) 136/92  04/15/18 124/82  04/10/18 (!) 144/92    Physical Exam Vitals and nursing note reviewed.  Constitutional:      Appearance: Normal appearance. He is well-developed.  HENT:     Head: Normocephalic.     Right Ear: Tympanic membrane, ear canal and external ear normal.     Left Ear: Tympanic membrane, ear canal and external ear normal.     Nose: Nose normal.     Mouth/Throat:     Pharynx: Uvula midline.  Eyes:     Conjunctiva/sclera: Conjunctivae normal.     Pupils: Pupils are equal, round, and reactive to light.  Neck:     Thyroid: No thyromegaly.     Vascular: No carotid bruit.  Cardiovascular:     Rate and Rhythm: Normal rate and regular rhythm.     Heart sounds: Normal heart sounds.  Pulmonary:     Effort: Pulmonary effort is normal.     Breath sounds: Normal breath sounds. No wheezing.  Chest:     Breasts:        Right: No mass.        Left: No mass.  Abdominal:     General: Bowel sounds are normal.     Palpations: Abdomen is soft.     Tenderness: There is no abdominal tenderness.  Musculoskeletal:        General: Normal range of motion.     Cervical back: Normal range of motion and neck supple.     Right lower leg: No edema.     Left lower leg: No edema.  Lymphadenopathy:     Cervical: No cervical adenopathy.  Skin:    General: Skin  is warm and dry.     Capillary Refill: Capillary refill takes less than 2 seconds.     Findings: No lesion.  Neurological:     Mental Status: He is alert and oriented to person, place, and time.     Sensory: Sensory deficit present.     Gait: Gait abnormal.     Deep Tendon Reflexes: Reflexes are normal and symmetric.  Psychiatric:        Speech: Speech normal.        Behavior: Behavior normal.        Thought Content: Thought content normal.  Judgment: Judgment normal.     Wt Readings from Last 3 Encounters:  09/08/19 221 lb (100.2 kg)  04/15/18 223 lb (101.2 kg)  04/10/18 223 lb 9.6 oz (101.4 kg)    BP (!) 136/92   Pulse 65   Temp (!) 97.5 F (36.4 C) (Temporal)   Ht 5\' 11"  (1.803 m)   Wt 221 lb (100.2 kg)   SpO2 95%   BMI 30.82 kg/m   Assessment and Plan: 1. Annual physical exam Normal exam Recommend regular physical activity, healthy diet - POCT urinalysis dipstick  2. Essential hypertension Not controlled today on metoprolol alone Will add losartan 50 gm and recheck in 2 months - CBC with Differential/Platelet - Comprehensive metabolic panel - losartan (COZAAR) 50 MG tablet; Take 1 tablet (50 mg total) by mouth daily.  Dispense: 90 tablet; Refill: 3 - metoprolol succinate (TOPROL-XL) 25 MG 24 hr tablet; Take 1 tablet (25 mg total) by mouth at bedtime. Take with or immediately following a meal.  Dispense: 90 tablet; Refill: 1  3. Obstructive sleep apnea syndrome Doing well on CPAP with excellent compliance and no side effects or complaints to suggest poor treatment  4. Meralgia paresthetica of both lower extremities Chronic leg sx slightly worse recently Increase Duloxetine to 40 mg - call for Rx if helpful  5. Mixed hyperlipidemia Not currently on medicaiton - Lipid panel  6. Hx of colonic polyp Due for Colonoscopy, last done at [facility] but patients want to go closer to home - Ambulatory referral to Gastroenterology  7. Prostate cancer screening DRE  deferred  8. Nocturia - PSA   Partially dictated using Editor, commissioning. Any errors are unintentional.  Halina Maidens, MD [facility]  09/08/2019

## 2019-09-09 LAB — COMPREHENSIVE METABOLIC PANEL
ALT: 21 IU/L (ref 0–44)
AST: 20 IU/L (ref 0–40)
Albumin/Globulin Ratio: 1.6 (ref 1.2–2.2)
Albumin: 4.5 g/dL (ref 3.8–4.9)
Alkaline Phosphatase: 92 IU/L (ref 39–117)
BUN/Creatinine Ratio: 15 (ref 9–20)
BUN: 16 mg/dL (ref 6–24)
Bilirubin Total: 0.3 mg/dL (ref 0.0–1.2)
CO2: 24 mmol/L (ref 20–29)
Calcium: 9.5 mg/dL (ref 8.7–10.2)
Chloride: 106 mmol/L (ref 96–106)
Creatinine, Ser: 1.04 mg/dL (ref 0.76–1.27)
GFR calc Af Amer: 91 mL/min/{1.73_m2} (ref >59)
GFR calc non Af Amer: 79 mL/min/{1.73_m2} (ref >59)
Globulin, Total: 2.9 g/dL (ref 1.5–4.5)
Glucose: 92 mg/dL (ref 65–99)
Potassium: 4.8 mmol/L (ref 3.5–5.2)
Sodium: 141 mmol/L (ref 134–144)
Total Protein: 7.4 g/dL (ref 6.0–8.5)

## 2019-09-09 LAB — CBC WITH DIFFERENTIAL/PLATELET
Basophils Absolute: 0 10*3/uL (ref 0.0–0.2)
Basos: 1 %
EOS (ABSOLUTE): 0.1 10*3/uL (ref 0.0–0.4)
Eos: 1 %
Hematocrit: 45.5 % (ref 37.5–51.0)
Hemoglobin: 15 g/dL (ref 13.0–17.7)
Immature Grans (Abs): 0 10*3/uL (ref 0.0–0.1)
Immature Granulocytes: 0 %
Lymphocytes Absolute: 2.4 10*3/uL (ref 0.7–3.1)
Lymphs: 37 %
MCH: 30.2 pg (ref 26.6–33.0)
MCHC: 33 g/dL (ref 31.5–35.7)
MCV: 92 fL (ref 79–97)
Monocytes Absolute: 0.5 10*3/uL (ref 0.1–0.9)
Monocytes: 9 %
Neutrophils Absolute: 3.3 10*3/uL (ref 1.4–7.0)
Neutrophils: 52 %
Platelets: 189 10*3/uL (ref 150–450)
RBC: 4.97 x10E6/uL (ref 4.14–5.80)
RDW: 12.5 % (ref 11.6–15.4)
WBC: 6.3 10*3/uL (ref 3.4–10.8)

## 2019-09-09 LAB — LIPID PANEL
Chol/HDL Ratio: 3.6 ratio (ref 0.0–5.0)
Cholesterol, Total: 203 mg/dL — ABNORMAL HIGH (ref 100–199)
HDL: 57 mg/dL (ref >39)
LDL Chol Calc (NIH): 125 mg/dL — ABNORMAL HIGH (ref 0–99)
Triglycerides: 116 mg/dL (ref 0–149)
VLDL Cholesterol Cal: 21 mg/dL (ref 5–40)

## 2019-09-09 LAB — PSA: Prostate Specific Ag, Serum: 0.5 ng/mL (ref 0.0–4.0)

## 2019-09-10 ENCOUNTER — Telehealth: Payer: Self-pay

## 2019-09-10 ENCOUNTER — Encounter: Payer: Self-pay | Admitting: Internal Medicine

## 2019-09-10 NOTE — Telephone Encounter (Signed)
Called and spoke with patient. Told him [facility] has been trying to get in touch with him about scheduling a colonoscopy. Gave him the number and told him to call and schedule. He said he will.   CM

## 2019-09-12 ENCOUNTER — Telehealth (INDEPENDENT_AMBULATORY_CARE_PROVIDER_SITE_OTHER): Payer: Self-pay | Admitting: Gastroenterology

## 2019-09-12 ENCOUNTER — Other Ambulatory Visit: Payer: Self-pay

## 2019-09-12 DIAGNOSIS — Z86010 Personal history of colonic polyps: Secondary | ICD-10-CM

## 2019-09-12 NOTE — Progress Notes (Signed)
Gastroenterology Pre-Procedure Review  Request Date: Friday 10/10/19 Requesting Physician: Dr. Allen Norris  PATIENT REVIEW QUESTIONS: The patient responded to the following health history questions as indicated:    1. Are you having any GI issues? no 2. Do you have a personal history of Polyps? yes (2016) 3. Do you have a family history of Colon Cancer or Polyps? yes (patients father colon cancer) 4. Diabetes Mellitus? no 5. Joint replacements in the past 12 months?no 6. Major health problems in the past 3 months?no 7. Any artificial heart valves, MVP, or defibrillator?no    MEDICATIONS & ALLERGIES:    Patient reports the following regarding taking any anticoagulation/antiplatelet therapy:   Plavix, Coumadin, Eliquis, Xarelto, Lovenox, Pradaxa, Brilinta, or Effient? no Aspirin? no  Patient confirms/reports the following medications:  Current Outpatient Medications  Medication Sig Dispense Refill  . CANNABIDIOL PO Take by mouth. Using daily for pain    . DULoxetine (CYMBALTA) 20 MG capsule TAKE 1 CAPSULE BY MOUTH EVERY DAY 90 capsule 0  . losartan (COZAAR) 50 MG tablet Take 1 tablet (50 mg total) by mouth daily. 90 tablet 3  . metoprolol succinate (TOPROL-XL) 25 MG 24 hr tablet Take 1 tablet (25 mg total) by mouth at bedtime. Take with or immediately following a meal. 90 tablet 1  . Multiple Vitamin (MULTIVITAMIN) LIQD Take 5 mLs by mouth daily.    . NON FORMULARY at bedtime. CPAP nightly     No current facility-administered medications for this visit.    Patient confirms/reports the following allergies:  Allergies  Allergen Reactions  . Gabapentin Other (See Comments)    Is not right  . Topiramate Rash    Blistered rash on face    No orders of the defined types were placed in this encounter.   AUTHORIZATION INFORMATION Primary Insurance: 1D#: Group #:  Secondary Insurance: 1D#: Group #:  SCHEDULE INFORMATION: Date: 10/10/19 Time: Location:[facility]

## 2019-10-01 ENCOUNTER — Encounter: Payer: Self-pay | Admitting: Gastroenterology

## 2019-10-01 ENCOUNTER — Other Ambulatory Visit: Payer: Self-pay

## 2019-10-02 ENCOUNTER — Encounter: Payer: Self-pay | Admitting: Gastroenterology

## 2019-10-08 ENCOUNTER — Other Ambulatory Visit
Admission: RE | Admit: 2019-10-08 | Discharge: 2019-10-08 | Disposition: A | Discharge status: Home / Self Care | Payer: Medicare Other | Source: Ambulatory Visit | Attending: Gastroenterology | Admitting: Gastroenterology

## 2019-10-08 ENCOUNTER — Other Ambulatory Visit: Payer: Self-pay

## 2019-10-08 DIAGNOSIS — Z20822 Contact with and (suspected) exposure to covid-19: Secondary | ICD-10-CM | POA: Insufficient documentation

## 2019-10-08 DIAGNOSIS — Z01812 Encounter for preprocedural laboratory examination: Secondary | ICD-10-CM | POA: Diagnosis not present

## 2019-10-09 LAB — SARS CORONAVIRUS 2 (TAT 6-24 HRS): SARS Coronavirus 2: NEGATIVE

## 2019-10-09 NOTE — Discharge Instructions (Signed)
General Anesthesia, Adult, Care After This sheet gives you information about how to care for yourself after your procedure. Your health care provider may also give you more specific instructions. If you have problems or questions, contact your health care provider. What can I expect after the procedure? After the procedure, the following side effects are common:  Pain or discomfort at the IV site.  Nausea.  Vomiting.  Sore throat.  Trouble concentrating.  Feeling cold or chills.  Weak or tired.  Sleepiness and fatigue.  Soreness and body aches. These side effects can affect parts of the body that were not involved in surgery. Follow these instructions at home:  For at least 24 hours after the procedure:  Have a responsible adult stay with you. It is important to have someone help care for you until you are awake and alert.  Rest as needed.  Do not: ? Participate in activities in which you could fall or become injured. ? Drive. ? Use heavy machinery. ? Drink alcohol. ? Take sleeping pills or medicines that cause drowsiness. ? Make important decisions or sign legal documents. ? Take care of children on your own. Eating and drinking  Follow any instructions from your health care provider about eating or drinking restrictions.  When you feel hungry, start by eating small amounts of foods that are soft and easy to digest (bland), such as toast. Gradually return to your regular diet.  Drink enough fluid to keep your urine pale yellow.  If you vomit, rehydrate by drinking water, juice, or clear broth. General instructions  If you have sleep apnea, surgery and certain medicines can increase your risk for breathing problems. Follow instructions from your health care provider about wearing your sleep device: ? Anytime you are sleeping, including during daytime naps. ? While taking prescription pain medicines, sleeping medicines, or medicines that make you drowsy.  Return to  your normal activities as told by your health care provider. Ask your health care provider what activities are safe for you.  Take over-the-counter and prescription medicines only as told by your health care provider.  If you smoke, do not smoke without supervision.  Keep all follow-up visits as told by your health care provider. This is important. Contact a health care provider if:  You have nausea or vomiting that does not get better with medicine.  You cannot eat or drink without vomiting.  You have pain that does not get better with medicine.  You are unable to pass urine.  You develop a skin rash.  You have a fever.  You have redness around your IV site that gets worse. Get help right away if:  You have difficulty breathing.  You have chest pain.  You have blood in your urine or stool, or you vomit blood. Summary  After the procedure, it is common to have a sore throat or nausea. It is also common to feel tired.  Have a responsible adult stay with you for the first 24 hours after general anesthesia. It is important to have someone help care for you until you are awake and alert.  When you feel hungry, start by eating small amounts of foods that are soft and easy to digest (bland), such as toast. Gradually return to your regular diet.  Drink enough fluid to keep your urine pale yellow.  Return to your normal activities as told by your health care provider. Ask your health care provider what activities are safe for you. This information is not   intended to replace advice given to you by your health care provider. Make sure you discuss any questions you have with your health care provider. Document Revised: 05/11/2017 Document Reviewed: 12/22/2016 Elsevier Patient Education  2020 Elsevier Inc.  

## 2019-10-10 ENCOUNTER — Ambulatory Visit: Payer: Medicare Other | Admitting: Anesthesiology

## 2019-10-10 ENCOUNTER — Ambulatory Visit: Admit: 2019-10-10 | Payer: Medicare Other | Admitting: Gastroenterology

## 2019-10-10 ENCOUNTER — Other Ambulatory Visit: Payer: Self-pay

## 2019-10-10 ENCOUNTER — Encounter
Admission: RE | Disposition: A | Discharge status: Home / Self Care | Payer: Self-pay | Source: Home / Self Care | Attending: Gastroenterology

## 2019-10-10 ENCOUNTER — Encounter: Payer: Self-pay | Admitting: Gastroenterology

## 2019-10-10 ENCOUNTER — Ambulatory Visit
Admission: RE | Admit: 2019-10-10 | Discharge: 2019-10-10 | Disposition: A | Discharge status: Home / Self Care | Payer: Medicare Other | Attending: Gastroenterology | Admitting: Gastroenterology

## 2019-10-10 DIAGNOSIS — Z86010 Personal history of colonic polyps: Secondary | ICD-10-CM | POA: Diagnosis not present

## 2019-10-10 DIAGNOSIS — G4730 Sleep apnea, unspecified: Secondary | ICD-10-CM | POA: Diagnosis not present

## 2019-10-10 DIAGNOSIS — I10 Essential (primary) hypertension: Secondary | ICD-10-CM | POA: Insufficient documentation

## 2019-10-10 DIAGNOSIS — Z79899 Other long term (current) drug therapy: Secondary | ICD-10-CM | POA: Insufficient documentation

## 2019-10-10 DIAGNOSIS — K635 Polyp of colon: Secondary | ICD-10-CM

## 2019-10-10 DIAGNOSIS — Z888 Allergy status to other drugs, medicaments and biological substances status: Secondary | ICD-10-CM | POA: Insufficient documentation

## 2019-10-10 DIAGNOSIS — D124 Benign neoplasm of descending colon: Secondary | ICD-10-CM | POA: Diagnosis not present

## 2019-10-10 DIAGNOSIS — E785 Hyperlipidemia, unspecified: Secondary | ICD-10-CM | POA: Diagnosis not present

## 2019-10-10 DIAGNOSIS — Z1211 Encounter for screening for malignant neoplasm of colon: Secondary | ICD-10-CM | POA: Diagnosis not present

## 2019-10-10 DIAGNOSIS — Z6829 Body mass index (BMI) 29.0-29.9, adult: Secondary | ICD-10-CM | POA: Insufficient documentation

## 2019-10-10 DIAGNOSIS — K641 Second degree hemorrhoids: Secondary | ICD-10-CM | POA: Insufficient documentation

## 2019-10-10 HISTORY — PX: POLYPECTOMY: SHX5525

## 2019-10-10 HISTORY — PX: COLONOSCOPY WITH PROPOFOL: SHX5780

## 2019-10-10 SURGERY — COLONOSCOPY WITH PROPOFOL
Anesthesia: Choice

## 2019-10-10 SURGERY — COLONOSCOPY WITH PROPOFOL
Anesthesia: General | Site: Rectum

## 2019-10-10 MED ORDER — LIDOCAINE HCL (CARDIAC) PF 100 MG/5ML IV SOSY
PREFILLED_SYRINGE | INTRAVENOUS | Status: DC | PRN
  Administered 2019-10-10: 40 mg via INTRAVENOUS

## 2019-10-10 MED ORDER — STERILE WATER FOR IRRIGATION IR SOLN
Status: DC | PRN
  Administered 2019-10-10: 50 mL

## 2019-10-10 MED ORDER — OXYCODONE HCL 5 MG PO TABS: 5.0000 mg | ORAL_TABLET | Freq: Once | ORAL | Status: DC | PRN

## 2019-10-10 MED ORDER — OXYCODONE HCL 5 MG/5ML PO SOLN: 5.0000 mg | Freq: Once | ORAL | Status: DC | PRN

## 2019-10-10 MED ORDER — PROPOFOL 10 MG/ML IV BOLUS
INTRAVENOUS | Status: DC | PRN
  Administered 2019-10-10: 20 mg via INTRAVENOUS
  Administered 2019-10-10: 100 mg via INTRAVENOUS
  Administered 2019-10-10 (×5): 20 mg via INTRAVENOUS

## 2019-10-10 MED ORDER — LACTATED RINGERS IV SOLN: INTRAVENOUS | Status: DC

## 2019-10-10 SURGICAL SUPPLY — 6 items
FORCEPS BIOP RAD 4 LRG CAP 4 (CUTTING FORCEPS) ×3 IMPLANT
GOWN CVR UNV OPN BCK APRN NK (MISCELLANEOUS) ×2 IMPLANT
GOWN ISOL THUMB LOOP REG UNIV (MISCELLANEOUS) ×4
KIT ENDO PROCEDURE OLY (KITS) ×3 IMPLANT
MANIFOLD NEPTUNE II (INSTRUMENTS) ×3 IMPLANT
WATER STERILE IRR 250ML POUR (IV SOLUTION) ×3 IMPLANT

## 2019-10-10 NOTE — Anesthesia Postprocedure Evaluation (Signed)
Anesthesia Post Note  Patient: Jay Ford  Procedure(s) Performed: COLONOSCOPY WITH BIOPSY (N/A Rectum) POLYPECTOMY (N/A Rectum)     Patient location during evaluation: PACU Anesthesia Type: General Level of consciousness: awake and alert Pain management: pain level controlled Vital Signs Assessment: post-procedure vital signs reviewed and stable Respiratory status: spontaneous breathing, nonlabored ventilation, respiratory function stable and patient connected to nasal cannula oxygen Cardiovascular status: blood pressure returned to baseline and stable Postop Assessment: no apparent nausea or vomiting Anesthetic complications: no    Kaya Klausing

## 2019-10-10 NOTE — Anesthesia Procedure Notes (Signed)
Procedure Name: MAC Date/Time: 10/10/2019 11:25 AM Performed by: Vanetta Shawl, CRNA Pre-anesthesia Checklist: Patient identified, Emergency Drugs available, Suction available, Timeout performed and Patient being monitored Patient Re-evaluated:Patient Re-evaluated prior to induction Oxygen Delivery Method: Nasal cannula Placement Confirmation: positive ETCO2

## 2019-10-10 NOTE — Transfer of Care (Signed)
Immediate Anesthesia Transfer of Care Note  Patient: Jay Ford  Procedure(s) Performed: COLONOSCOPY WITH BIOPSY (N/A Rectum) POLYPECTOMY (N/A Rectum)  Patient Location: PACU  Anesthesia Type: General  Level of Consciousness: awake, alert  and patient cooperative  Airway and Oxygen Therapy: Patient Spontanous Breathing and Patient connected to supplemental oxygen  Post-op Assessment: Post-op Vital signs reviewed, Patient's Cardiovascular Status Stable, Respiratory Function Stable, Patent Airway and No signs of Nausea or vomiting  Post-op Vital Signs: Reviewed and stable  Complications: No apparent anesthesia complications

## 2019-10-10 NOTE — Op Note (Signed)
[facility] Gastroenterology Patient Name: Jay Ford Procedure Date: 10/10/2019 11:19 AM MRN: [id number] Account #: [id number] Date of Birth: September 17, 1960 Admit Type: Outpatient Age: 59 Room: [facility] OR ROOM 01 Gender: Male Note Status: Finalized Procedure:             Colonoscopy Indications:           High risk colon cancer surveillance: Personal history                         of colonic polyps Providers:             Lucilla Lame MD, MD Referring MD:          Halina Maidens, MD (Referring MD) Medicines:             Propofol per Anesthesia Complications:         No immediate complications. Procedure:             Pre-Anesthesia Assessment:                        - Prior to the procedure, a History and Physical was                         performed, and patient medications and allergies were                         reviewed. The patient's tolerance of previous                         anesthesia was also reviewed. The risks and benefits                         of the procedure and the sedation options and risks                         were discussed with the patient. All questions were                         answered, and informed consent was obtained. Prior                         Anticoagulants: The patient has taken no previous                         anticoagulant or antiplatelet agents. ASA Grade                         Assessment: II - A patient with mild systemic disease.                         After reviewing the risks and benefits, the patient                         was deemed in satisfactory condition to undergo the                         procedure.  After obtaining informed consent, the colonoscope was                         passed under direct vision. Throughout the procedure,                         the patient's blood pressure, pulse, and oxygen                         saturations were monitored continuously. The              Colonoscope was introduced through the anus and                         advanced to the the cecum, identified by appendiceal                         orifice and ileocecal valve. The colonoscopy was                         performed without difficulty. The patient tolerated                         the procedure well. The quality of the bowel                         preparation was excellent. Findings:      The perianal and digital rectal examinations were normal.      Two sessile polyps were found in the descending colon. The polyps were 2       to 3 mm in size. These polyps were removed with a cold biopsy forceps.       Resection and retrieval were complete.      Non-bleeding internal hemorrhoids were found during retroflexion. The       hemorrhoids were Grade II (internal hemorrhoids that prolapse but reduce       spontaneously). Impression:            - Two 2 to 3 mm polyps in the descending colon,                         removed with a cold biopsy forceps. Resected and                         retrieved.                        - Non-bleeding internal hemorrhoids. Recommendation:        - Discharge patient to home.                        - Resume previous diet.                        - Continue present medications.                        - Await pathology results.                        - Repeat colonoscopy in 5 years for  surveillance. Procedure Code(s):     --- Professional ---                        [phone], Colonoscopy, flexible; with biopsy, single or                         multiple Diagnosis Code(s):     --- Professional ---                        Z86.010, Personal history of colonic polyps                        K63.5, Polyp of colon CPT copyright 2019 American Medical Association. All rights reserved. The codes documented in this report are preliminary and upon coder review may  be revised to meet current compliance requirements. Lucilla Lame MD, MD 10/10/2019  11:46:37 AM This report has been signed electronically. Number of Addenda: 0 Note Initiated On: 10/10/2019 11:19 AM Scope Withdrawal Time: 0 hours 13 minutes 41 seconds  Total Procedure Duration: 0 hours 16 minutes 27 seconds  Estimated Blood Loss:  Estimated blood loss: none.      [facility]

## 2019-10-10 NOTE — Anesthesia Preprocedure Evaluation (Signed)
Anesthesia Evaluation  Patient identified by MRN, date of birth, ID band Patient awake    Reviewed: NPO status   History of Anesthesia Complications Negative for: history of anesthetic complications  Airway Mallampati: II  TM Distance: >3 FB Neck ROM: full    Dental no notable dental hx.    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  Reproductive/Obstetrics                             Anesthesia Physical Anesthesia Plan  ASA: II  Anesthesia Plan: General   Post-op Pain Management:    Induction:   PONV Risk Score and Plan: 2 and TIVA and Propofol infusion  Airway Management Planned:   Additional Equipment:   Intra-op Plan:   Post-operative Plan:   Informed Consent: I have reviewed the patients History and Physical, chart, labs and discussed the procedure including the risks, benefits and alternatives for the proposed anesthesia with the patient or authorized representative who has indicated his/her understanding and acceptance.       Plan Discussed with: CRNA  Anesthesia Plan Comments:         Anesthesia Quick Evaluation

## 2019-10-10 NOTE — H&P (Signed)
Lucilla Lame, MD [facility] [address] [place], [place] [address] Phone:[phone] Fax : [phone]  Primary Care Physician:  Glean Hess, MD Primary Gastroenterologist:  Dr. Allen Norris  Pre-Procedure History & Physical: HPI:  Jay Ford is a 59 y.o. male is here for an colonoscopy.   Past Medical History:  Diagnosis Date  . Hyperlipidemia   . Hypertension    controlled on meds  . Sleep apnea    CPAP  . Tremors of nervous system    Right upper extremity  . West Nile Virus infection 2008   pain in knee-thigh in right leg    Past Surgical History:  Procedure Laterality Date  . HAND SURGERY Bilateral 2013   carpal tunnel both hands  . ROTATOR CUFF REPAIR Bilateral 2013    Prior to Admission medications   Medication Sig Start Date End Date Taking? Authorizing Provider  CANNABIDIOL PO Take by mouth. Using daily for pain   Yes [provider]  DULoxetine (CYMBALTA) 20 MG capsule TAKE 1 CAPSULE BY MOUTH EVERY DAY 08/27/19  Yes Glean Hess, MD  losartan (COZAAR) 50 MG tablet Take 1 tablet (50 mg total) by mouth daily. 09/08/19  Yes Glean Hess, MD  metoprolol succinate (TOPROL-XL) 25 MG 24 hr tablet Take 1 tablet (25 mg total) by mouth at bedtime. Take with or immediately following a meal. 09/08/19  Yes Glean Hess, MD  Multiple Vitamin (MULTIVITAMIN) LIQD Take 5 mLs by mouth daily.   Yes [provider]  NON FORMULARY at bedtime. CPAP nightly    [provider]    Allergies as of 09/12/2019 - Review Complete 09/12/2019  Allergen Reaction Noted  . Gabapentin Other (See Comments) 06/30/2015  . Topiramate Rash 08/09/2016    Family History  Problem Relation Age of Onset  . Cancer Father        prostate cancer  . Diabetes Father   . Heart attack Father   . Cancer Mother        breast cancer  . Diabetes Sister     Social History   Socioeconomic History  . Marital status: Married    Spouse name: Not on file  .  Number of children: 2  . Years of education: Not on file  . Highest education level: High school graduate  Occupational History  . Occupation: parent    Comment: on SS Disability  Tobacco Use  . Smoking status: Never Smoker  . Smokeless tobacco: Never Used  Substance and Sexual Activity  . Alcohol use: No  . Drug use: No  . Sexual activity: Not on file  Other Topics Concern  . Not on file  Social History Narrative  . Not on file   Social Determinants of Health   Financial Resource Strain:   . Difficulty of Paying Living Expenses:   Food Insecurity:   . Worried About Charity fundraiser in the Last Year:   . Arboriculturist in the Last Year:   Transportation Needs:   . Film/video editor (Medical):   Marland Kitchen Lack of Transportation (Non-Medical):   Physical Activity:   . Days of Exercise per Week:   . Minutes of Exercise per Session:   Stress:   . Feeling of Stress :   Social Connections:   . Frequency of Communication with Friends and Family:   . Frequency of Social Gatherings with Friends and Family:   . Attends Religious Services:   . Active Member  of Clubs or Organizations:   . Attends Archivist Meetings:   Marland Kitchen Marital Status:   Intimate Partner Violence:   . Fear of Current or Ex-Partner:   . Emotionally Abused:   Marland Kitchen Physically Abused:   . Sexually Abused:     Review of Systems: See HPI, otherwise negative ROS  Physical Exam: BP (!) 156/93   Pulse 81   Temp 97.9 F (36.6 C) (Temporal)   Ht 5\' 11"  (1.803 m)   Wt 95.9 kg   SpO2 98%   BMI 29.50 kg/m  General:   Alert,  pleasant and cooperative in NAD Head:  Normocephalic and atraumatic. Neck:  Supple; no masses or thyromegaly. Lungs:  Clear throughout to auscultation.    Heart:  Regular rate and rhythm. Abdomen:  Soft, nontender and nondistended. Normal bowel sounds, without guarding, and without rebound.   Neurologic:  Alert and  oriented x4;  grossly normal  neurologically.  Impression/Plan: Jay Ford is here for an colonoscopy to be performed for history of adenomatous colon polyps in 08/2014  Risks, benefits, limitations, and alternatives regarding  colonoscopy have been reviewed with the patient.  Questions have been answered.  All parties agreeable.   Lucilla Lame, MD  10/10/2019, 10:24 AM

## 2019-10-13 ENCOUNTER — Encounter: Payer: Self-pay | Admitting: *Deleted

## 2019-10-14 ENCOUNTER — Encounter: Payer: Self-pay | Admitting: Gastroenterology

## 2019-10-14 LAB — SURGICAL PATHOLOGY

## 2019-11-19 ENCOUNTER — Ambulatory Visit: Payer: Medicare Other | Admitting: Internal Medicine

## 2019-11-30 ENCOUNTER — Other Ambulatory Visit: Payer: Self-pay | Admitting: Internal Medicine

## 2019-11-30 DIAGNOSIS — A9232 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: Secondary | ICD-10-CM

## 2019-11-30 NOTE — Telephone Encounter (Signed)
Requested Prescriptions  Pending Prescriptions Disp Refills  . DULoxetine (CYMBALTA) 20 MG capsule [Pharmacy Med Name: DULOXETINE HCL DR 20 MG CAP] 90 capsule 1    Sig: TAKE 1 CAPSULE BY MOUTH EVERY DAY     Psychiatry: Antidepressants - SNRI Failed - 11/30/2019 11:45 AM      Failed - Last BP in normal range    BP Readings from Last 1 Encounters:  10/10/19 (!) 121/98         Passed - Valid encounter within last 6 months    Recent Outpatient Visits          2 months ago Annual physical exam   [facility] Glean Hess, MD   1 year ago Essential hypertension   [facility] Glean Hess, MD   2 years ago Essential hypertension   [facility] Glean Hess, MD   3 years ago Essential hypertension   [facility] Glean Hess, MD   3 years ago Essential hypertension   [facility] Glean Hess, MD      Future Appointments            In 4 weeks Army Melia Jesse Sans, MD [facility], [facility]

## 2019-12-29 ENCOUNTER — Ambulatory Visit: Payer: Medicare Other | Admitting: Internal Medicine

## 2019-12-29 DIAGNOSIS — R5381 Other malaise: Secondary | ICD-10-CM | POA: Diagnosis not present

## 2019-12-29 DIAGNOSIS — U071 COVID-19: Secondary | ICD-10-CM | POA: Diagnosis not present

## 2019-12-29 DIAGNOSIS — I10 Essential (primary) hypertension: Secondary | ICD-10-CM | POA: Diagnosis not present

## 2019-12-29 DIAGNOSIS — R5383 Other fatigue: Secondary | ICD-10-CM | POA: Diagnosis not present

## 2020-01-07 ENCOUNTER — Ambulatory Visit (INDEPENDENT_AMBULATORY_CARE_PROVIDER_SITE_OTHER): Payer: Medicare Other

## 2020-01-07 DIAGNOSIS — Z0000 Encounter for general adult medical examination without abnormal findings: Secondary | ICD-10-CM

## 2020-01-07 NOTE — Patient Instructions (Signed)
Jay Ford , Thank you for taking time to come for your Medicare Wellness Visit. I appreciate your ongoing commitment to your health goals. Please review the following plan we discussed and let me know if I can assist you in the future.   Screening recommendations/referrals: Colonoscopy: done 10/10/19. Repeat in 2026. Recommended yearly ophthalmology/optometry visit for glaucoma screening and checkup Recommended yearly dental visit for hygiene and checkup  Vaccinations: Influenza vaccine: postponed Pneumococcal vaccine: due at age 45 Tdap vaccine: done 06/30/15 Shingles vaccine: Shingrix discussed. Please contact your pharmacy for coverage information.  Covid-19: discussed  Advanced directives: Advance directive discussed with you today. Even though you declined this today please call our office should you change your mind and we can give you the proper paperwork for you to fill out.  Conditions/risks identified: Recommend increasing physical activity as tolerated  Next appointment: Follow up in one year for your annual wellness visit   Preventive Care 40-64 Years, Male Preventive care refers to lifestyle choices and visits with your health care provider that can promote health and wellness. What does preventive care include?  A yearly physical exam. This is also called an annual well check.  Dental exams once or twice a year.  Routine eye exams. Ask your health care provider how often you should have your eyes checked.  Personal lifestyle choices, including:  Daily care of your teeth and gums.  Regular physical activity.  Eating a healthy diet.  Avoiding tobacco and drug use.  Limiting alcohol use.  Practicing safe sex.  Taking low-dose aspirin every day starting at age 78. What happens during an annual well check? The services and screenings done by your health care provider during your annual well check will depend on your age, overall health, lifestyle risk factors,  and family history of disease. Counseling  Your health care provider may ask you questions about your:  Alcohol use.  Tobacco use.  Drug use.  Emotional well-being.  Home and relationship well-being.  Sexual activity.  Eating habits.  Work and work Statistician. Screening  You may have the following tests or measurements:  Height, weight, and BMI.  Blood pressure.  Lipid and cholesterol levels. These may be checked every 5 years, or more frequently if you are over 74 years old.  Skin check.  Lung cancer screening. You may have this screening every year starting at age 67 if you have a 30-pack-year history of smoking and currently smoke or have quit within the past 15 years.  Fecal occult blood test (FOBT) of the stool. You may have this test every year starting at age 83.  Flexible sigmoidoscopy or colonoscopy. You may have a sigmoidoscopy every 5 years or a colonoscopy every 10 years starting at age 31.  Prostate cancer screening. Recommendations will vary depending on your family history and other risks.  Hepatitis C blood test.  Hepatitis B blood test.  Sexually transmitted disease (STD) testing.  Diabetes screening. This is done by checking your blood sugar (glucose) after you have not eaten for a while (fasting). You may have this done every 1-3 years. Discuss your test results, treatment options, and if necessary, the need for more tests with your health care provider. Vaccines  Your health care provider may recommend certain vaccines, such as:  Influenza vaccine. This is recommended every year.  Tetanus, diphtheria, and acellular pertussis (Tdap, Td) vaccine. You may need a Td booster every 10 years.  Zoster vaccine. You may need this after age 40.  Pneumococcal  13-valent conjugate (PCV13) vaccine. You may need this if you have certain conditions and have not been vaccinated.  Pneumococcal polysaccharide (PPSV23) vaccine. You may need one or two doses if  you smoke cigarettes or if you have certain conditions. Talk to your health care provider about which screenings and vaccines you need and how often you need them. This information is not intended to replace advice given to you by your health care provider. Make sure you discuss any questions you have with your health care provider. Document Released: 06/04/2015 Document Revised: 01/26/2016 Document Reviewed: 03/09/2015 Elsevier Interactive Patient Education  2017 [place] Prevention in the Home Falls can cause injuries. They can happen to people of all ages. There are many things you can do to make your home safe and to help prevent falls. What can I do on the outside of my home?  Regularly fix the edges of walkways and driveways and fix any cracks.  Remove anything that might make you trip as you walk through a door, such as a raised step or threshold.  Trim any bushes or trees on the path to your home.  Use bright outdoor lighting.  Clear any walking paths of anything that might make someone trip, such as rocks or tools.  Regularly check to see if handrails are loose or broken. Make sure that both sides of any steps have handrails.  Any raised decks and porches should have guardrails on the edges.  Have any leaves, snow, or ice cleared regularly.  Use sand or salt on walking paths during winter.  Clean up any spills in your garage right away. This includes oil or grease spills. What can I do in the bathroom?  Use night lights.  Install grab bars by the toilet and in the tub and shower. Do not use towel bars as grab bars.  Use non-skid mats or decals in the tub or shower.  If you need to sit down in the shower, use a plastic, non-slip stool.  Keep the floor dry. Clean up any water that spills on the floor as soon as it happens.  Remove soap buildup in the tub or shower regularly.  Attach bath mats securely with double-sided non-slip rug tape.  Do not have  throw rugs and other things on the floor that can make you trip. What can I do in the bedroom?  Use night lights.  Make sure that you have a light by your bed that is easy to reach.  Do not use any sheets or blankets that are too big for your bed. They should not hang down onto the floor.  Have a firm chair that has side arms. You can use this for support while you get dressed.  Do not have throw rugs and other things on the floor that can make you trip. What can I do in the kitchen?  Clean up any spills right away.  Avoid walking on wet floors.  Keep items that you use a lot in easy-to-reach places.  If you need to reach something above you, use a strong step stool that has a grab bar.  Keep electrical cords out of the way.  Do not use floor polish or wax that makes floors slippery. If you must use wax, use non-skid floor wax.  Do not have throw rugs and other things on the floor that can make you trip. What can I do with my stairs?  Do not leave any items on the stairs.  Make sure that there are handrails on both sides of the stairs and use them. Fix handrails that are broken or loose. Make sure that handrails are as long as the stairways.  Check any carpeting to make sure that it is firmly attached to the stairs. Fix any carpet that is loose or worn.  Avoid having throw rugs at the top or bottom of the stairs. If you do have throw rugs, attach them to the floor with carpet tape.  Make sure that you have a light switch at the top of the stairs and the bottom of the stairs. If you do not have them, ask someone to add them for you. What else can I do to help prevent falls?  Wear shoes that:  Do not have high heels.  Have rubber bottoms.  Are comfortable and fit you well.  Are closed at the toe. Do not wear sandals.  If you use a stepladder:  Make sure that it is fully opened. Do not climb a closed stepladder.  Make sure that both sides of the stepladder are  locked into place.  Ask someone to hold it for you, if possible.  Clearly mark and make sure that you can see:  Any grab bars or handrails.  First and last steps.  Where the edge of each step is.  Use tools that help you move around (mobility aids) if they are needed. These include:  Canes.  Walkers.  Scooters.  Crutches.  Turn on the lights when you go into a dark area. Replace any light bulbs as soon as they burn out.  Set up your furniture so you have a clear path. Avoid moving your furniture around.  If any of your floors are uneven, fix them.  If there are any pets around you, be aware of where they are.  Review your medicines with your doctor. Some medicines can make you feel dizzy. This can increase your chance of falling. Ask your doctor what other things that you can do to help prevent falls. This information is not intended to replace advice given to you by your health care provider. Make sure you discuss any questions you have with your health care provider. Document Released: 03/04/2009 Document Revised: 10/14/2015 Document Reviewed: 06/12/2014 Elsevier Interactive Patient Education  2017 [employer].

## 2020-01-07 NOTE — Progress Notes (Signed)
Subjective:   Jay Ford is a 59 y.o. male who presents for Medicare Annual/Subsequent preventive examination.  Virtual Visit via Telephone Note  I connected with  Jay Ford on 01/07/20 at  2:40 PM EDT by telephone and verified that I am speaking with the correct person using two identifiers.  Medicare Annual Wellness visit completed telephonically due to Covid-19 pandemic.   Location: Patient: home Provider: [facility]   I discussed the limitations, risks, security and privacy concerns of performing an evaluation and management service by telephone and the availability of in person appointments. The patient expressed understanding and agreed to proceed.  Unable to perform video visit due to video visit attempted and failed and/or patient does not have video capability.   Some vital signs may be absent or patient reported.   Clemetine Marker, LPN    Review of Systems     Cardiac Risk Factors include: advanced age (>53men, >52 women);hypertension     Objective:    There were no vitals filed for this visit. There is no height or weight on file to calculate BMI.  Advanced Directives 01/07/2020 10/10/2019 04/10/2018 03/05/2017  Does Patient Have a Medical Advance Directive? No No No No  Does patient want to make changes to medical advance directive? - No - Patient declined - Yes (MAU/Ambulatory/Procedural Areas - Information given)  Would patient like information on creating a medical advance directive? No - Patient declined No - Patient declined Yes (MAU/Ambulatory/Procedural Areas - Information given) -    Current Medications (verified) Outpatient Encounter Medications as of 01/07/2020  Medication Sig  . CANNABIDIOL PO Take by mouth. Using daily for pain  . DULoxetine (CYMBALTA) 20 MG capsule TAKE 1 CAPSULE BY MOUTH EVERY DAY  . losartan (COZAAR) 50 MG tablet Take 1 tablet (50 mg total) by mouth daily.  . metoprolol succinate (TOPROL-XL) 25 MG 24 hr tablet Take 1 tablet (25  mg total) by mouth at bedtime. Take with or immediately following a meal.  . Multiple Vitamin (MULTIVITAMIN) LIQD Take 5 mLs by mouth daily.  . NON FORMULARY at bedtime. CPAP nightly   No facility-administered encounter medications on file as of 01/07/2020.    Allergies (verified) Gabapentin and Topiramate   History: Past Medical History:  Diagnosis Date  . Hyperlipidemia   . Hypertension    controlled on meds  . Sleep apnea    CPAP  . Tremors of nervous system    Right upper extremity  . 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 COLONOSCOPY WITH PROPOFOL N/A 10/10/2019   Procedure: COLONOSCOPY WITH BIOPSY;  Surgeon: Lucilla Lame, MD;  Location: [place];  Service: Endoscopy;  Laterality: N/A;  priority 3  . HAND SURGERY Bilateral 2013   carpal tunnel both hands  . POLYPECTOMY N/A 10/10/2019   Procedure: POLYPECTOMY;  Surgeon: Lucilla Lame, MD;  Location: [place];  Service: Endoscopy;  Laterality: N/A;  . ROTATOR CUFF REPAIR Bilateral 2013   Family History  Problem Relation Age of Onset  . Cancer Father        prostate cancer  . Diabetes Father   . Heart attack Father   . Cancer Mother        breast cancer  . Diabetes Sister    Social History   Socioeconomic History  . Marital status: Married    Spouse name: Not on file  . Number of  children: 2  . Years of education: Not on file  . Highest education level: High school graduate  Occupational History  . Occupation: parent    Comment: on SS Disability  Tobacco Use  . Smoking status: Never Smoker  . Smokeless tobacco: Never Used  Vaping Use  . Vaping Use: Never used  Substance and Sexual Activity  . Alcohol use: No  . Drug use: No  . Sexual activity: Not on file  Other Topics Concern  . Not on file  Social History Narrative  . Not on file   Social Determinants of Health   Financial Resource Strain: Low  Risk   . Difficulty of Paying Living Expenses: Not hard at all  Food Insecurity: No Food Insecurity  . Worried About Charity fundraiser in the Last Year: Never true  . Ran Out of Food in the Last Year: Never true  Transportation Needs: No Transportation Needs  . Lack of Transportation (Medical): No  . Lack of Transportation (Non-Medical): No  Physical Activity: Inactive  . Days of Exercise per Week: 0 days  . Minutes of Exercise per Session: 0 min  Stress: Stress Concern Present  . Feeling of Stress : To some extent  Social Connections: Moderately Integrated  . Frequency of Communication with Friends and Family: More than three times a week  . Frequency of Social Gatherings with Friends and Family: More than three times a week  . Attends Religious Services: More than 4 times per year  . Active Member of Clubs or Organizations: No  . Attends Archivist Meetings: Never  . Marital Status: Married    Tobacco Counseling Counseling given: Not Answered   Clinical Intake:  Pre-visit preparation completed: Yes  Pain : No/denies pain     Nutritional Risks: None Diabetes: No  How often do you need to have someone help you when you read instructions, pamphlets, or other written materials from your doctor or pharmacy?: 1 - Never    Interpreter Needed?: No  Information entered by :: Clemetine Marker LPN   Activities of Daily Living In your present state of health, do you have any difficulty performing the following activities: 01/07/2020 10/10/2019  Hearing? N N  Comment declines hearing aids -  Vision? N N  Difficulty concentrating or making decisions? N N  Walking or climbing stairs? N N  Dressing or bathing? N N  Doing errands, shopping? N -  Preparing Food and eating ? N -  Using the Toilet? N -  In the past six months, have you accidently leaked urine? N -  Do you have problems with loss of bowel control? N -  Managing your Medications? N -  Managing your  Finances? N -  Housekeeping or managing your Housekeeping? N -  Some recent data might be hidden    Patient Care Team: Glean Hess, MD as PCP - General (Family Medicine) Smithson, Myrna Blazer, MD (Neurology) Angelia Mould, NP as Nurse Practitioner (Orthopedic Surgery)  Indicate any recent Medical Services you may have received from other than [facility] providers in the past year (date may be approximate).     Assessment:   This is a routine wellness examination for [address].  Hearing/Vision screen  Hearing Screening   125Hz  250Hz  500Hz  1000Hz  2000Hz  3000Hz  4000Hz  6000Hz  8000Hz   Right ear:           Left ear:           Comments: Pt denies hearing difficulty  Vision Screening Comments: [facility] screen[facility]; due for exam  Dietary issues and exercise activities discussed: Current Exercise Habits: The patient does not participate in regular exercise at present, Exercise limited by: None identified  Goals    . Reduce portion size     Continue to eat 3 small meals per day and at least 2 snacks per day      Depression Screen PHQ 2/9 Scores 01/07/2020 09/08/2019 04/15/2018 04/10/2018 10/11/2017 03/05/2017 06/30/2015  PHQ - 2 Score 0 0 0 0 0 2 0  PHQ- 9 Score - 0 1 - - 9 -    Fall Risk Fall Risk  01/07/2020 09/08/2019 04/10/2018 10/11/2017 03/05/2017  Falls in the past year? 0 0 0 No No  Number falls in past yr: 0 0 - - -  Injury with Fall? 0 0 - - -  Risk for fall due to : No Fall Risks No Fall Risks - - Impaired balance/gait  Risk for fall due to: Comment - - - - pain in knees and thigh  Follow up Falls prevention discussed Falls evaluation completed - - -    Any stairs in or around the home? No  If so, are there any without handrails? No  Home free of loose throw rugs in walkways, pet beds, electrical cords, etc? Yes  Adequate lighting in your home to reduce risk of falls? Yes   ASSISTIVE DEVICES UTILIZED TO PREVENT FALLS:  Life alert?  No  Use of a cane, walker or w/c? No  Grab bars in the bathroom? Yes  Shower chair or bench in shower? Yes  Elevated toilet seat or a handicapped toilet? No   TIMED UP AND GO:  Was the test performed? No . Telephonic visit.    Cognitive Function: 6CIT deferred for 2021 AWV; pt states no memory issues        Immunizations Immunization History  Administered Date(s) Administered  . Influenza,inj,Quad PF,6+ Mos 03/20/2016, 03/05/2017, 04/10/2018  . Influenza-Unspecified 02/20/2015  . Tdap 06/30/2015    TDAP status: Up to date   Flu Vaccine status: Declined, Education has been provided regarding the importance of this vaccine but patient still declined. Advised may receive this vaccine at local pharmacy or Health Dept. Aware to provide a copy of the vaccination record if obtained from local pharmacy or Health Dept. Verbalized acceptance and understanding.   Pneumococcal vaccine status: due at age 63  Covid-19 vaccine status: Declined, Education has been provided regarding the importance of this vaccine but patient still declined. Advised may receive this vaccine at local pharmacy or Health Dept.or vaccine clinic. Aware to provide a copy of the vaccination record if obtained from local pharmacy or Health Dept. Verbalized acceptance and understanding.  Qualifies for Shingles Vaccine? Yes   Zostavax completed No   Shingrix Completed?: No.    Education has been provided regarding the importance of this vaccine. Patient has been advised to call insurance company to determine out of pocket expense if they have not yet received this vaccine. Advised may also receive vaccine at local pharmacy or Health Dept. Verbalized acceptance and understanding.  Screening Tests Health Maintenance  Topic Date Due  . COVID-19 Vaccine (1) Never done  . INFLUENZA VACCINE  12/21/2019  . HIV Screening  09/07/2020 (Originally 07/02/1975)  . COLONOSCOPY  10/09/2024  . TETANUS/TDAP  06/29/2025  . Hepatitis C  Screening  Addressed  . HEMOGLOBIN A1C  Discontinued    Health Maintenance  Health Maintenance Due  Topic Date Due  . COVID-19 Vaccine (1) Never done  . INFLUENZA VACCINE  12/21/2019    Colorectal cancer screening: Completed 10/10/19. Repeat every 5 years  Lung Cancer Screening: (Low Dose CT Chest recommended if Age 64-80 years, 30 pack-year currently smoking OR have quit w/in 15years.) does not qualify.   Additional Screening:  Hepatitis C Screening: does qualify; Completed 02/26/15  Vision Screening: Recommended annual ophthalmology exams for early detection of glaucoma and other disorders of the eye. Is the patient up to date with their annual eye exam?  No  Who is the provider or what is the name of the office in which the patient attends annual eye exams? [facility] Screening: Recommended annual dental exams for proper oral hygiene  Community Resource Referral / Chronic Care Management: CRR required this visit?  No   CCM required this visit?  No      Plan:     I have personally reviewed and noted the following in the patient's chart:   . Medical and social history . Use of alcohol, tobacco or illicit drugs  . Current medications and supplements . Functional ability and status . Nutritional status . Physical activity . Advanced directives . List of other physicians . Hospitalizations, surgeries, and ER visits in previous 12 months . Vitals . Screenings to include cognitive, depression, and falls . Referrals and appointments  In addition, I have reviewed and discussed with patient certain preventive protocols, quality metrics, and best practice recommendations. A written personalized care plan for preventive services as well as general preventive health recommendations were provided to patient.     Clemetine Marker, LPN   02/11/3006   Nurse Notes: Pt diagnosed with Covid-19 on 12/30/19 and he is recovering. Pt states he is okay but still feels weak  and tired with decreased appetite. Pt advised to contact office if needed.

## 2020-03-04 ENCOUNTER — Other Ambulatory Visit: Payer: Self-pay | Admitting: Internal Medicine

## 2020-03-04 DIAGNOSIS — I10 Essential (primary) hypertension: Secondary | ICD-10-CM

## 2020-05-30 ENCOUNTER — Other Ambulatory Visit: Payer: Self-pay | Admitting: Internal Medicine

## 2020-05-30 DIAGNOSIS — I10 Essential (primary) hypertension: Secondary | ICD-10-CM

## 2020-06-02 ENCOUNTER — Other Ambulatory Visit: Payer: Self-pay | Admitting: Internal Medicine

## 2020-06-02 DIAGNOSIS — A9232 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: Secondary | ICD-10-CM

## 2020-06-30 ENCOUNTER — Other Ambulatory Visit: Payer: Self-pay | Admitting: Internal Medicine

## 2020-06-30 DIAGNOSIS — A9232 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: Secondary | ICD-10-CM

## 2020-06-30 DIAGNOSIS — I10 Essential (primary) hypertension: Secondary | ICD-10-CM

## 2020-07-01 ENCOUNTER — Other Ambulatory Visit: Payer: Self-pay | Admitting: Internal Medicine

## 2020-07-01 DIAGNOSIS — A9232 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: Secondary | ICD-10-CM

## 2020-07-01 NOTE — Telephone Encounter (Signed)
Requested medications are due for refill today yes  Requested medications are on the active medication list yes  Last refill 1/12  Future visit scheduled no  Notes to clinic Has already had a curtesy refill and there is no upcoming appointment scheduled.

## 2020-07-01 NOTE — Telephone Encounter (Signed)
Please call pt to schedule a f/u appt for HTN. Pt needs an appt for further refills on medication.  KP

## 2020-07-28 ENCOUNTER — Other Ambulatory Visit: Payer: Self-pay

## 2020-07-28 ENCOUNTER — Ambulatory Visit (INDEPENDENT_AMBULATORY_CARE_PROVIDER_SITE_OTHER): Payer: Medicare Other | Admitting: Internal Medicine

## 2020-07-28 ENCOUNTER — Encounter: Payer: Self-pay | Admitting: Internal Medicine

## 2020-07-28 VITALS — BP 128/88 | HR 63 | Temp 98.2°F | Ht 71.0 in | Wt 222.0 lb

## 2020-07-28 DIAGNOSIS — A9232 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: Secondary | ICD-10-CM | POA: Diagnosis not present

## 2020-07-28 DIAGNOSIS — G5713 Meralgia paresthetica, bilateral lower limbs: Secondary | ICD-10-CM

## 2020-07-28 DIAGNOSIS — G4733 Obstructive sleep apnea (adult) (pediatric): Secondary | ICD-10-CM

## 2020-07-28 DIAGNOSIS — I10 Essential (primary) hypertension: Secondary | ICD-10-CM | POA: Diagnosis not present

## 2020-07-28 DIAGNOSIS — E782 Mixed hyperlipidemia: Secondary | ICD-10-CM | POA: Diagnosis not present

## 2020-07-28 MED ORDER — LOSARTAN POTASSIUM 50 MG PO TABS: 50.0000 mg | ORAL_TABLET | Freq: Every day | ORAL | 1 refills | Status: DC

## 2020-07-28 MED ORDER — DULOXETINE HCL 60 MG PO CPEP: 60.0000 mg | ORAL_CAPSULE | Freq: Every day | ORAL | 1 refills | Status: DC

## 2020-07-28 MED ORDER — METOPROLOL SUCCINATE ER 25 MG PO TB24: 25.0000 mg | ORAL_TABLET | Freq: Every day | ORAL | 1 refills | Status: DC

## 2020-07-28 NOTE — Progress Notes (Signed)
Date:  07/28/2020   Name:  Jay Ford   DOB:  Jan 19, 1961   MRN:  [id number]   Chief Complaint: Hypertension  Hypertension This is a chronic problem. The problem is controlled (normal readings at home.). Pertinent negatives include no chest pain, headaches, palpitations or shortness of breath. Past treatments include angiotensin blockers and beta blockers. The current treatment provides significant improvement. There are no compliance problems.  There is no history of kidney disease, CAD/MI or CVA.  Hyperlipidemia This is a chronic problem. The problem is uncontrolled. Recent lipid tests were reviewed and are high. Factors aggravating his hyperlipidemia include fatty foods. Pertinent negatives include no chest pain or shortness of breath. He is currently on no antihyperlipidemic treatment.   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  The meralgia improved dramatically on cymbalta - he worked up to 60 mg and needs a new Rx.  Lab Results  Component Value Date   CREATININE 1.04 09/08/2019   BUN 16 09/08/2019   NA 141 09/08/2019   K 4.8 09/08/2019   CL 106 09/08/2019   CO2 24 09/08/2019   Lab Results  Component Value Date   CHOL 203 (H) 09/08/2019   HDL 57 09/08/2019   LDLCALC 125 (H) 09/08/2019   TRIG 116 09/08/2019   CHOLHDL 3.6 09/08/2019   Lab Results  Component Value Date   TSH 0.954 01/19/2016   No results found for: HGBA1C Lab Results  Component Value Date   WBC 6.3 09/08/2019   HGB 15.0 09/08/2019   HCT 45.5 09/08/2019   MCV 92 09/08/2019   PLT 189 09/08/2019   Lab Results  Component Value Date   ALT 21 09/08/2019   AST 20 09/08/2019   ALKPHOS 92 09/08/2019   BILITOT 0.3 09/08/2019     Review of Systems  Constitutional: Negative for fatigue and unexpected weight change.  HENT: Negative for nosebleeds.   Eyes: Negative for visual disturbance.  Respiratory: Negative for cough, chest tightness, shortness of breath and wheezing.   Cardiovascular: Negative for  chest pain, palpitations and leg swelling.  Gastrointestinal: Negative for abdominal pain, constipation and diarrhea.  Neurological: Positive for numbness. Negative for dizziness, weakness, light-headedness and headaches.    Patient Active Problem List   Diagnosis Date Noted  . Polyp of descending colon   . Nocturia 10/11/2017  . Tinea cruris 10/11/2017  . Pain medication agreement signed 08/09/2016  . 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 Obesity, Class I, BMI 30-34.9 06/30/2015  . Plantar fasciitis 10/21/2014  . Essential hypertension 02/13/2014  . Meralgia paresthetica of both lower extremities 02/13/2014  . Palpitations 10/09/2011  . Hyperlipidemia   . 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 Obstructive sleep apnea syndrome 02/07/2011  . Hx of colonic polyp 02/07/2011    Allergies  Allergen Reactions  . Gabapentin Other (See Comments)    Is not right, made see things  . Topiramate Rash    Blistered rash on face    Past Surgical History:  Procedure Laterality Date  . COLONOSCOPY WITH PROPOFOL N/A 10/10/2019   Procedure: COLONOSCOPY WITH BIOPSY;  Surgeon: Lucilla Lame, MD;  Location: [facility];  Service: Endoscopy;  Laterality: N/A;  priority 3  . HAND SURGERY Bilateral 2013   carpal tunnel both hands  . POLYPECTOMY N/A 10/10/2019   Procedure: POLYPECTOMY;  Surgeon: Lucilla Lame, MD;  Location: [facility];  Service: Endoscopy;  Laterality: N/A;  . ROTATOR CUFF REPAIR Bilateral 2013  Social History   Tobacco Use  . Smoking status: Never Smoker  . Smokeless tobacco: Never Used  Vaping Use  . Vaping Use: Never used  Substance Use Topics  . Alcohol use: No  . Drug use: No     Medication list has been reviewed and updated.  Current Meds  Medication Sig  . CANNABIDIOL PO Take by mouth. Using daily for pain  . DULoxetine (CYMBALTA) 20 MG capsule TAKE 1 CAPSULE BY MOUTH EVERY DAY  . losartan (COZAAR) 50 MG  tablet Take 1 tablet (50 mg total) by mouth daily.  . metoprolol succinate (TOPROL-XL) 25 MG 24 hr tablet Take 1 tablet (25 mg total) by mouth at bedtime. Take with or immediately following a meal. OFFICE VISIT NEEDED FOR ADDITIONAL REFILLS  . Multiple Vitamin (MULTIVITAMIN) LIQD Take 5 mLs by mouth daily.  . NON FORMULARY at bedtime. CPAP nightly    PHQ 2/9 Scores 07/28/2020 01/07/2020 09/08/2019 04/15/2018  PHQ - 2 Score 0 0 0 0  PHQ- 9 Score 0 - 0 1    GAD 7 : Generalized Anxiety Score 07/28/2020 09/08/2019  Nervous, Anxious, on Edge 0 0  Control/stop worrying 0 0  Worry too much - different things 0 0  Trouble relaxing 0 0  Restless 0 0  Easily annoyed or irritable 0 0  Afraid - awful might happen 0 0  Total GAD 7 Score 0 0  Anxiety Difficulty - Not difficult at all    BP Readings from Last 3 Encounters:  07/28/20 128/88  10/10/19 (!) 121/98  09/08/19 (!) 136/92    Physical Exam Vitals and nursing note reviewed.  Constitutional:      General: He is not in acute distress.    Appearance: He is well-developed.  HENT:     Head: Normocephalic and atraumatic.  Cardiovascular:     Rate and Rhythm: Normal rate and regular rhythm.     Pulses: Normal pulses.     Heart sounds: No murmur heard.   Pulmonary:     Effort: Pulmonary effort is normal. No respiratory distress.     Breath sounds: No wheezing or rhonchi.  Musculoskeletal:     Cervical back: Normal range of motion.     Right lower leg: No edema.     Left lower leg: No edema.  Lymphadenopathy:     Cervical: No cervical adenopathy.  Skin:    General: Skin is warm and dry.     Capillary Refill: Capillary refill takes less than 2 seconds.     Findings: No rash.  Neurological:     Mental Status: He is alert and oriented to person, place, and time.     Sensory: Sensory deficit present.     Motor: Motor function is intact.  Psychiatric:        Mood and Affect: Mood normal.        Behavior: Behavior normal.     Wt  Readings from Last 3 Encounters:  07/28/20 222 lb (100.7 kg)  10/10/19 211 lb 8 oz (95.9 kg)  09/08/19 221 lb (100.2 kg)    BP 128/88   Pulse 63   Temp 98.2 F (36.8 C) (Oral)   Ht 5\' 11"  (1.803 m)   Wt 222 lb (100.7 kg)   SpO2 98%   BMI 30.96 kg/m   Assessment and Plan: 1. Essential hypertension Clinically stable exam with well controlled BP. Tolerating medications without side effects at this time. Pt to continue current regimen and low sodium diet;  benefits of regular exercise as able discussed. - losartan (COZAAR) 50 MG tablet; Take 1 tablet (50 mg total) by mouth daily.  Dispense: 90 tablet; Refill: 1 - metoprolol succinate (TOPROL-XL) 25 MG 24 hr tablet; Take 1 tablet (25 mg total) by mouth at bedtime. Take with or immediately following a meal.  Dispense: 90 tablet; Refill: 1 - CBC with Differential/Platelet - Comprehensive metabolic panel  2. Obstructive sleep apnea syndrome Continue CPAP  3. Mixed hyperlipidemia Not currently on medication - recommend low fat diet, exercise and weight loss If markedly higher, will consider medication - Lipid panel  4. 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  Dispense: 90 capsule; Refill: 1  5. Meralgia paresthetica of both lower extremities Improved with Cymbalta   Partially dictated using Editor, commissioning. Any errors are unintentional.  Halina Maidens, MD [facility]  07/28/2020

## 2020-08-05 ENCOUNTER — Telehealth: Payer: Self-pay

## 2020-08-05 NOTE — Telephone Encounter (Signed)
Called and reminded patient to get his lab work done from his appt. He said when he went to [facility] they were trying to charge him 250 dollars up front and he cannot do that at this time.  Per Dr. Army Melia., told him to come back and have his blood drawn at the hospital lab downstairs and they will not charge him up front.  He said he will come in the morning and verbalized understanding.

## 2020-08-06 ENCOUNTER — Other Ambulatory Visit
Admission: RE | Admit: 2020-08-06 | Discharge: 2020-08-06 | Disposition: A | Discharge status: Home / Self Care | Payer: Medicare Other | Attending: Internal Medicine | Admitting: Internal Medicine

## 2020-08-06 ENCOUNTER — Other Ambulatory Visit: Payer: Self-pay

## 2020-08-06 DIAGNOSIS — E782 Mixed hyperlipidemia: Secondary | ICD-10-CM | POA: Diagnosis not present

## 2020-08-06 DIAGNOSIS — I10 Essential (primary) hypertension: Secondary | ICD-10-CM | POA: Diagnosis not present

## 2020-08-06 LAB — COMPREHENSIVE METABOLIC PANEL
ALT: 22 U/L (ref 0–44)
AST: 22 U/L (ref 15–41)
Albumin: 4.2 g/dL (ref 3.5–5.0)
Alkaline Phosphatase: 68 U/L (ref 38–126)
Anion gap: 7 (ref 5–15)
BUN: 15 mg/dL (ref 6–20)
CO2: 22 mmol/L (ref 22–32)
Calcium: 8.9 mg/dL (ref 8.9–10.3)
Chloride: 108 mmol/L (ref 98–111)
Creatinine, Ser: 0.99 mg/dL (ref 0.61–1.24)
GFR, Estimated: >60 mL/min (ref >60)
Glucose, Bld: 104 mg/dL — ABNORMAL HIGH (ref 70–99)
Potassium: 4.5 mmol/L (ref 3.5–5.1)
Sodium: 137 mmol/L (ref 135–145)
Total Bilirubin: 0.7 mg/dL (ref 0.3–1.2)
Total Protein: 7.6 g/dL (ref 6.5–8.1)

## 2020-08-06 LAB — CBC WITH DIFFERENTIAL/PLATELET
Abs Immature Granulocytes: 0.02 10*3/uL (ref 0.00–0.07)
Basophils Absolute: 0 10*3/uL (ref 0.0–0.1)
Basophils Relative: 1 %
Eosinophils Absolute: 0.1 10*3/uL (ref 0.0–0.5)
Eosinophils Relative: 2 %
HCT: 44.2 % (ref 39.0–52.0)
Hemoglobin: 14.5 g/dL (ref 13.0–17.0)
Immature Granulocytes: 0 %
Lymphocytes Relative: 34 %
Lymphs Abs: 1.7 10*3/uL (ref 0.7–4.0)
MCH: 30 pg (ref 26.0–34.0)
MCHC: 32.8 g/dL (ref 30.0–36.0)
MCV: 91.5 fL (ref 80.0–100.0)
Monocytes Absolute: 0.5 10*3/uL (ref 0.1–1.0)
Monocytes Relative: 9 %
Neutro Abs: 2.8 10*3/uL (ref 1.7–7.7)
Neutrophils Relative %: 54 %
Platelets: 192 10*3/uL (ref 150–400)
RBC: 4.83 MIL/uL (ref 4.22–5.81)
RDW: 12.5 % (ref 11.5–15.5)
WBC: 5.2 10*3/uL (ref 4.0–10.5)
nRBC: 0 % (ref 0.0–0.2)

## 2020-08-06 LAB — LIPID PANEL
Cholesterol: 198 mg/dL (ref 0–200)
HDL: 48 mg/dL (ref >40)
LDL Cholesterol: 121 mg/dL — ABNORMAL HIGH (ref 0–99)
Total CHOL/HDL Ratio: 4.1 RATIO
Triglycerides: 146 mg/dL (ref <150)
VLDL: 29 mg/dL (ref 0–40)

## 2020-12-21 DIAGNOSIS — H31001 Unspecified chorioretinal scars, right eye: Secondary | ICD-10-CM | POA: Diagnosis not present

## 2021-01-10 ENCOUNTER — Ambulatory Visit: Payer: Medicare Other

## 2021-01-18 ENCOUNTER — Other Ambulatory Visit: Payer: Self-pay | Admitting: Internal Medicine

## 2021-01-18 DIAGNOSIS — A9232 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: Secondary | ICD-10-CM

## 2021-01-18 DIAGNOSIS — I10 Essential (primary) hypertension: Secondary | ICD-10-CM

## 2021-01-18 NOTE — Telephone Encounter (Signed)
Requested Prescriptions  Pending Prescriptions Disp Refills  . DULoxetine (CYMBALTA) 60 MG capsule [Pharmacy Med Name: DULOXETINE HCL DR 60 MG CAP] 90 capsule 0    Sig: TAKE 1 CAPSULE BY MOUTH EVERY DAY     Psychiatry: Antidepressants - SNRI Passed - 01/18/2021  4:17 PM      Passed - Last BP in normal range    BP Readings from Last 1 Encounters:  07/28/20 128/88         Passed - Valid encounter within last 6 months    Recent Outpatient Visits          5 months ago Essential hypertension   [facility] Glean Hess, MD   1 year ago Annual physical exam   [facility] Glean Hess, MD   2 years ago Essential hypertension   [facility] Glean Hess, MD   3 years ago Essential hypertension   [facility] Glean Hess, MD   4 years ago Essential hypertension   [facility] Glean Hess, MD      Future Appointments            In 2 weeks Glean Hess, MD [facility]           . metoprolol succinate (TOPROL-XL) 25 MG 24 hr tablet [Pharmacy Med Name: METOPROLOL SUCC ER 25 MG TAB] 90 tablet 0    Sig: TAKE 1 TABLET (25 MG TOTAL) BY MOUTH AT BEDTIME. TAKE WITH OR IMMEDIATELY FOLLOWING A MEAL.     Cardiovascular:  Beta Blockers Passed - 01/18/2021  4:17 PM      Passed - Last BP in normal range    BP Readings from Last 1 Encounters:  07/28/20 128/88         Passed - Last Heart Rate in normal range    Pulse Readings from Last 1 Encounters:  07/28/20 63         Passed - Valid encounter within last 6 months    Recent Outpatient Visits          5 months ago Essential hypertension   [facility] Glean Hess, MD   1 year ago Annual physical exam   [facility] Glean Hess, MD   2 years ago Essential hypertension   [facility] Glean Hess, MD   3 years ago Essential hypertension   [facility] Glean Hess, MD   4 years ago  Essential hypertension   [facility] Glean Hess, MD      Future Appointments            In 2 weeks Army Melia Jesse Sans, MD [facility]

## 2021-01-26 ENCOUNTER — Ambulatory Visit (INDEPENDENT_AMBULATORY_CARE_PROVIDER_SITE_OTHER): Payer: Medicare Other

## 2021-01-26 DIAGNOSIS — Z0000 Encounter for general adult medical examination without abnormal findings: Secondary | ICD-10-CM | POA: Diagnosis not present

## 2021-01-26 NOTE — Patient Instructions (Signed)
Jay Ford , Thank you for taking time to come for your Medicare Wellness Visit. I appreciate your ongoing commitment to your health goals. Please review the following plan we discussed and let me know if I can assist you in the future.   Screening recommendations/referrals: Colonoscopy: done 10/10/19. Repeat 09/2024 Recommended yearly ophthalmology/optometry visit for glaucoma screening and checkup Recommended yearly dental visit for hygiene and checkup  Vaccinations: Influenza vaccine: due Pneumococcal vaccine: due Tdap vaccine: done 06/30/15 Shingles vaccine: Shingrix discussed. Please contact your pharmacy for coverage information.  Covid-19: due  Advanced directives: Advance directive discussed with you today. I have provided a copy for you to complete at home and have notarized. Once this is complete please bring a copy in to our office so we can scan it into your chart.   Conditions/risks identified: Keep up the great work!  Next appointment: Follow up in one year for your annual wellness visit   Preventive Care 40-64 Years, Male Preventive care refers to lifestyle choices and visits with your health care provider that can promote health and wellness. What does preventive care include? A yearly physical exam. This is also called an annual well check. Dental exams once or twice a year. Routine eye exams. Ask your health care provider how often you should have your eyes checked. Personal lifestyle choices, including: Daily care of your teeth and gums. Regular physical activity. Eating a healthy diet. Avoiding tobacco and drug use. Limiting alcohol use. Practicing safe sex. Taking low-dose aspirin every day starting at age 96. What happens during an annual well check? The services and screenings done by your health care provider during your annual well check will depend on your age, overall health, lifestyle risk factors, and family history of disease. Counseling  Your health  care provider may ask you questions about your: Alcohol use. Tobacco use. Drug use. Emotional well-being. Home and relationship well-being. Sexual activity. Eating habits. Work and work Statistician. Screening  You may have the following tests or measurements: Height, weight, and BMI. Blood pressure. Lipid and cholesterol levels. These may be checked every 5 years, or more frequently if you are over 37 years old. Skin check. Lung cancer screening. You may have this screening every year starting at age 52 if you have a 30-pack-year history of smoking and currently smoke or have quit within the past 15 years. Fecal occult blood test (FOBT) of the stool. You may have this test every year starting at age 65. Flexible sigmoidoscopy or colonoscopy. You may have a sigmoidoscopy every 5 years or a colonoscopy every 10 years starting at age 57. Prostate cancer screening. Recommendations will vary depending on your family history and other risks. Hepatitis C blood test. Hepatitis B blood test. Sexually transmitted disease (STD) testing. Diabetes screening. This is done by checking your blood sugar (glucose) after you have not eaten for a while (fasting). You may have this done every 1-3 years. Discuss your test results, treatment options, and if necessary, the need for more tests with your health care provider. Vaccines  Your health care provider may recommend certain vaccines, such as: Influenza vaccine. This is recommended every year. Tetanus, diphtheria, and acellular pertussis (Tdap, Td) vaccine. You may need a Td booster every 10 years. Zoster vaccine. You may need this after age 14. Pneumococcal 13-valent conjugate (PCV13) vaccine. You may need this if you have certain conditions and have not been vaccinated. Pneumococcal polysaccharide (PPSV23) vaccine. You may need one or two doses if you smoke cigarettes  or if you have certain conditions. Talk to your health care provider about which  screenings and vaccines you need and how often you need them. This information is not intended to replace advice given to you by your health care provider. Make sure you discuss any questions you have with your health care provider. Document Released: 06/04/2015 Document Revised: 01/26/2016 Document Reviewed: 03/09/2015 Elsevier Interactive Patient Education  2017 [place] Prevention in the Home Falls can cause injuries. They can happen to people of all ages. There are many things you can do to make your home safe and to help prevent falls. What can I do on the outside of my home? Regularly fix the edges of walkways and driveways and fix any cracks. Remove anything that might make you trip as you walk through a door, such as a raised step or threshold. Trim any bushes or trees on the path to your home. Use bright outdoor lighting. Clear any walking paths of anything that might make someone trip, such as rocks or tools. Regularly check to see if handrails are loose or broken. Make sure that both sides of any steps have handrails. Any raised decks and porches should have guardrails on the edges. Have any leaves, snow, or ice cleared regularly. Use sand or salt on walking paths during winter. Clean up any spills in your garage right away. This includes oil or grease spills. What can I do in the bathroom? Use night lights. Install grab bars by the toilet and in the tub and shower. Do not use towel bars as grab bars. Use non-skid mats or decals in the tub or shower. If you need to sit down in the shower, use a plastic, non-slip stool. Keep the floor dry. Clean up any water that spills on the floor as soon as it happens. Remove soap buildup in the tub or shower regularly. Attach bath mats securely with double-sided non-slip rug tape. Do not have throw rugs and other things on the floor that can make you trip. What can I do in the bedroom? Use night lights. Make sure that you have a  light by your bed that is easy to reach. Do not use any sheets or blankets that are too big for your bed. They should not hang down onto the floor. Have a firm chair that has side arms. You can use this for support while you get dressed. Do not have throw rugs and other things on the floor that can make you trip. What can I do in the kitchen? Clean up any spills right away. Avoid walking on wet floors. Keep items that you use a lot in easy-to-reach places. If you need to reach something above you, use a strong step stool that has a grab bar. Keep electrical cords out of the way. Do not use floor polish or wax that makes floors slippery. If you must use wax, use non-skid floor wax. Do not have throw rugs and other things on the floor that can make you trip. What can I do with my stairs? Do not leave any items on the stairs. Make sure that there are handrails on both sides of the stairs and use them. Fix handrails that are broken or loose. Make sure that handrails are as long as the stairways. Check any carpeting to make sure that it is firmly attached to the stairs. Fix any carpet that is loose or worn. Avoid having throw rugs at the top or bottom of the  stairs. If you do have throw rugs, attach them to the floor with carpet tape. Make sure that you have a light switch at the top of the stairs and the bottom of the stairs. If you do not have them, ask someone to add them for you. What else can I do to help prevent falls? Wear shoes that: Do not have high heels. Have rubber bottoms. Are comfortable and fit you well. Are closed at the toe. Do not wear sandals. If you use a stepladder: Make sure that it is fully opened. Do not climb a closed stepladder. Make sure that both sides of the stepladder are locked into place. Ask someone to hold it for you, if possible. Clearly mark and make sure that you can see: Any grab bars or handrails. First and last steps. Where the edge of each step  is. Use tools that help you move around (mobility aids) if they are needed. These include: Canes. Walkers. Scooters. Crutches. Turn on the lights when you go into a dark area. Replace any light bulbs as soon as they burn out. Set up your furniture so you have a clear path. Avoid moving your furniture around. If any of your floors are uneven, fix them. If there are any pets around you, be aware of where they are. Review your medicines with your doctor. Some medicines can make you feel dizzy. This can increase your chance of falling. Ask your doctor what other things that you can do to help prevent falls. This information is not intended to replace advice given to you by your health care provider. Make sure you discuss any questions you have with your health care provider. Document Released: 03/04/2009 Document Revised: 10/14/2015 Document Reviewed: 06/12/2014 Elsevier Interactive Patient Education  2017 [employer].

## 2021-01-26 NOTE — Progress Notes (Signed)
Subjective:   Jay Ford is a 60 y.o. male who presents for Medicare Annual/Subsequent preventive examination.  Virtual Visit via Telephone Note  I connected with  [name] on 01/26/21 at  8:40 AM EDT by telephone and verified that I am speaking with the correct person using two identifiers.  Location: Patient: home Provider: [facility] Persons participating in the virtual visit: [place]   I discussed the limitations, risks, security and privacy concerns of performing an evaluation and management service by telephone and the availability of in person appointments. The patient expressed understanding and agreed to proceed.  Interactive audio and video telecommunications were attempted between this nurse and patient, however failed, due to patient having technical difficulties OR patient did not have access to video capability.  We continued and completed visit with audio only.  Some vital signs may be absent or patient reported.   Clemetine Marker, LPN   Review of Systems     Cardiac Risk Factors include: advanced age (>44mn, >>75women);male gender;hypertension     Objective:    There were no vitals filed for this visit. There is no height or weight on file to calculate BMI.  Advanced Directives 01/26/2021 01/07/2020 10/10/2019 04/10/2018 03/05/2017  Does Patient Have a Medical Advance Directive? No No No No No  Does patient want to make changes to medical advance directive? - - No - Patient declined - Yes (MAU/Ambulatory/Procedural Areas - Information given)  Would patient like information on creating a medical advance directive? Yes (MAU/Ambulatory/Procedural Areas - Information given) No - Patient declined No - Patient declined Yes (MAU/Ambulatory/Procedural Areas - Information given) -    Current Medications (verified) Outpatient Encounter Medications as of 01/26/2021  Medication Sig   CANNABIDIOL PO Take by mouth. Using daily for pain   DULoxetine  (CYMBALTA) 60 MG capsule TAKE 1 CAPSULE BY MOUTH EVERY DAY   losartan (COZAAR) 50 MG tablet Take 1 tablet (50 mg total) by mouth daily.   metoprolol succinate (TOPROL-XL) 25 MG 24 hr tablet TAKE 1 TABLET (25 MG TOTAL) BY MOUTH AT BEDTIME. TAKE WITH OR IMMEDIATELY FOLLOWING A MEAL.   Multiple Vitamin (MULTIVITAMIN) LIQD Take 5 mLs by mouth daily.   NON FORMULARY at bedtime. CPAP nightly   No facility-administered encounter medications on file as of 01/26/2021.    Allergies (verified) Gabapentin and Topiramate   History: Past Medical History:  Diagnosis Date   Hyperlipidemia    Hypertension    controlled on meds   Sleep apnea    CPAP   Tremors of nervous system    Right upper extremity   West Nile Virus infection 2008   pain in knee-thigh in right leg   Past Surgical History:  Procedure Laterality Date   COLONOSCOPY WITH PROPOFOL N/A 10/10/2019   Procedure: COLONOSCOPY WITH BIOPSY;  Surgeon: WLucilla Lame MD;  [facility]  Service: Endoscopy;  Laterality: N/A;  priority 3   HAND SURGERY Bilateral 2013   carpal tunnel both hands   POLYPECTOMY N/A 10/10/2019   Procedure: POLYPECTOMY;  Surgeon: WLucilla Lame MD;  [facility]  Service: Endoscopy;  Laterality: N/A;   ROTATOR CUFF REPAIR Bilateral 2013   Family History  Problem Relation Age of Onset   Cancer Father        prostate cancer   Diabetes Father    Heart attack Father    Cancer Mother        breast cancer   Diabetes Sister  Social History   Socioeconomic History   Marital status: Married    Spouse name: Not on file   Number of children: 2   Years of education: Not on file   Highest education level: High school graduate  Occupational History   Occupation: parent    Comment: on SS Disability  Tobacco Use   Smoking status: Never   Smokeless tobacco: Never  Vaping Use   Vaping Use: Never used  Substance and Sexual Activity   Alcohol use: No   Drug use: No   Sexual  activity: Not on file  Other Topics Concern   Not on file  Social History Narrative   Not on file   Social Determinants of Health   Financial Resource Strain: Low Risk    Difficulty of Paying Living Expenses: Not hard at all  Food Insecurity: No Food Insecurity   Worried About Charity fundraiser in the Last Year: Never true   [place] in the Last Year: Never true  Transportation Needs: No Transportation Needs   Lack of Transportation (Medical): No   Lack of Transportation (Non-Medical): No  Physical Activity: Insufficiently Active   Days of Exercise per Week: 7 days   Minutes of Exercise per Session: 20 min  Stress: No Stress Concern Present   Feeling of Stress : Not at all  Social Connections: Moderately Isolated   Frequency of Communication with Friends and Family: More than three times a week   Frequency of Social Gatherings with Friends and Family: More than three times a week   Attends Religious Services: Never   Marine scientist or Organizations: No   Attends Music therapist: Never   Marital Status: Married    Tobacco Counseling Counseling given: Not Answered   Clinical Intake:  Pre-visit preparation completed: Yes  Pain : No/denies pain     Nutritional Risks: None Diabetes: No  How often do you need to have someone help you when you read instructions, pamphlets, or other written materials from your doctor or pharmacy?: 1 - Never    Interpreter Needed?: No  Information entered by :: Clemetine Marker LPN   Activities of Daily Living In your present state of health, do you have any difficulty performing the following activities: 01/26/2021  Hearing? N  Vision? N  Difficulty concentrating or making decisions? N  Walking or climbing stairs? N  Dressing or bathing? N  Doing errands, shopping? N  Preparing Food and eating ? N  Using the Toilet? N  In the past six months, have you accidently leaked urine? N  Do you have problems  with loss of bowel control? N  Managing your Medications? N  Managing your Finances? N  Housekeeping or managing your Housekeeping? N  Some recent data might be hidden    Patient Care Team: Glean Hess, MD as PCP - General (Family Medicine) Smithson, Myrna Blazer, MD (Neurology) Angelia Mould, NP as Nurse Practitioner (Orthopedic Surgery)  Indicate any recent Medical Services you may have received from other than [facility] providers in the past year (date may be approximate).     Assessment:   This is a routine wellness examination for [place].  Hearing/Vision screen Hearing Screening - Comments:: Pt denies hearing difficulty  Vision Screening - Comments:: Annual vision screenings done at [facility] issues and exercise activities discussed: Current Exercise Habits: Home exercise routine, Type of exercise: walking, Time (Minutes): 20, Frequency (Times/Week): 7, Weekly Exercise (Minutes/Week):  140, Intensity: Mild, Exercise limited by: orthopedic condition(s)   Goals Addressed             This Visit's Progress    Reduce portion size   On track    Continue to eat 3 small meals per day and at least 2 snacks per day       Depression Screen PHQ 2/9 Scores 01/26/2021 07/28/2020 01/07/2020 09/08/2019 04/15/2018 04/10/2018 10/11/2017  PHQ - 2 Score 0 0 0 0 0 0 0  PHQ- 9 Score - 0 - 0 1 - -    Fall Risk Fall Risk  01/26/2021 07/28/2020 01/07/2020 09/08/2019 04/10/2018  Falls in the past year? 0 0 0 0 0  Number falls in past yr: 0 - 0 0 -  Injury with Fall? 0 - 0 0 -  Risk for fall due to : No Fall Risks - No Fall Risks No Fall Risks -  Risk for fall due to: Comment - - - - -  Follow up Falls prevention discussed Falls evaluation completed Falls prevention discussed Falls evaluation completed -    FALL RISK PREVENTION PERTAINING TO THE HOME:  Any stairs in or around the home? No  If so, are there any without handrails? No  Home free of loose throw rugs in  walkways, pet beds, electrical cords, etc? Yes  Adequate lighting in your home to reduce risk of falls? Yes   ASSISTIVE DEVICES UTILIZED TO PREVENT FALLS:  Life alert? No  Use of a cane, walker or w/c? No  Grab bars in the bathroom? Yes  Shower chair or bench in shower? No  Elevated toilet seat or a handicapped toilet? No   TIMED UP AND GO:  Was the test performed? No . Telephonic visit.   Cognitive Function: Normal cognitive status assessed by direct observation by this Nurse Health Advisor. No abnormalities found.          Immunizations Immunization History  Administered Date(s) Administered   Influenza,inj,Quad PF,6+ Mos 03/20/2016, 03/05/2017, 04/10/2018   Influenza-Unspecified 02/20/2015   Tdap 06/30/2015    TDAP status: Up to date  Flu Vaccine status: Due, Education has been provided regarding the importance of this vaccine. Advised may receive this vaccine at local pharmacy or Health Dept. Aware to provide a copy of the vaccination record if obtained from local pharmacy or Health Dept. Verbalized acceptance and understanding.  Pneumococcal vaccine status: Due, Education has been provided regarding the importance of this vaccine. Advised may receive this vaccine at local pharmacy or Health Dept. Aware to provide a copy of the vaccination record if obtained from local pharmacy or Health Dept. Verbalized acceptance and understanding.  Covid-19 vaccine status: Declined, Education has been provided regarding the importance of this vaccine but patient still declined. Advised may receive this vaccine at local pharmacy or Health Dept.or vaccine clinic. Aware to provide a copy of the vaccination record if obtained from local pharmacy or Health Dept. Verbalized acceptance and understanding.  Qualifies for Shingles Vaccine? Yes   Zostavax completed No   Shingrix Completed?: No.    Education has been provided regarding the importance of this vaccine. Patient has been advised to call  insurance company to determine out of pocket expense if they have not yet received this vaccine. Advised may also receive vaccine at local pharmacy or Health Dept. Verbalized acceptance and understanding.  Screening Tests Health Maintenance  Topic Date Due   HIV Screening  Never done   Zoster Vaccines- Shingrix (1 of 2) Never  done   INFLUENZA VACCINE  12/20/2020   COVID-19 Vaccine (1) 02/11/2021 (Originally 07/02/1965)   COLONOSCOPY (Pts 45-5yr Insurance coverage will need to be confirmed)  10/09/2024   TETANUS/TDAP  06/29/2025   Hepatitis C Screening  Addressed   Pneumococcal Vaccine [id number] old  Aged Out   HPV VACCINES  Aged Out   HEMOGLOBIN A1C  Discontinued    Health Maintenance  Health Maintenance Due  Topic Date Due   HIV Screening  Never done   Zoster Vaccines- Shingrix (1 of 2) Never done   INFLUENZA VACCINE  12/20/2020    Colorectal cancer screening: Type of screening: Colonoscopy. Completed 10/10/19. Repeat every 5 years  Lung Cancer Screening: (Low Dose CT Chest recommended if Age 59-80years, 30 pack-year currently smoking OR have quit w/in 15years.) does not qualify.   Additional Screening:  Hepatitis C Screening: does qualify; Completed 02/26/15  Vision Screening: Recommended annual ophthalmology exams for early detection of glaucoma and other disorders of the eye. Is the patient up to date with their annual eye exam?  Yes  Who is the provider or what is the name of the office in which the patient [facility]ends [facility] exams[facility]   Dental Screening: Recommended annual dental exams for proper oral hygiene  Community Resource Referral / Chronic Care Management: CRR required this visit?  No   CCM required this visit?  No      Plan:     I have personally reviewed and noted the following in the patient's chart:   Medical and social history Use of alcohol, tobacco or illicit drugs  Current medications and supplements including opioid  prescriptions. Patient is not currently taking opioid prescriptions. Functional ability and status Nutritional status Physical activity Advanced directives List of other physicians Hospitalizations, surgeries, and ER visits in previous 12 months Vitals Screenings to include cognitive, depression, and falls Referrals and appointments  In addition, I have reviewed and discussed with patient certain preventive protocols, quality metrics, and best practice recommendations. A written personalized care plan for preventive services as well as general preventive health recommendations were provided to patient.     KClemetine Marker LPN   [id number]  Nurse Notes: none

## 2021-02-01 ENCOUNTER — Other Ambulatory Visit: Payer: Self-pay

## 2021-02-01 ENCOUNTER — Encounter: Payer: Self-pay | Admitting: Internal Medicine

## 2021-02-01 ENCOUNTER — Ambulatory Visit (INDEPENDENT_AMBULATORY_CARE_PROVIDER_SITE_OTHER): Payer: Medicare Other | Admitting: Internal Medicine

## 2021-02-01 VITALS — BP 124/90 | HR 60 | Temp 98.2°F | Ht 71.0 in | Wt 222.0 lb

## 2021-02-01 DIAGNOSIS — Z23 Encounter for immunization: Secondary | ICD-10-CM | POA: Diagnosis not present

## 2021-02-01 DIAGNOSIS — I10 Essential (primary) hypertension: Secondary | ICD-10-CM

## 2021-02-01 DIAGNOSIS — A9232 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: Secondary | ICD-10-CM | POA: Diagnosis not present

## 2021-02-01 MED ORDER — DULOXETINE HCL 30 MG PO CPEP: 30.0000 mg | ORAL_CAPSULE | Freq: Every day | ORAL | 1 refills | Status: DC

## 2021-02-01 MED ORDER — LOSARTAN POTASSIUM 50 MG PO TABS: 50.0000 mg | ORAL_TABLET | Freq: Every day | ORAL | 1 refills | Status: DC

## 2021-02-01 NOTE — Progress Notes (Signed)
Date:  02/01/2021   Name:  Jay Ford   DOB:  23-Aug-1960   MRN:  [id number]   Chief Complaint: Hypertension and Flu Vaccine  Hypertension This is a chronic problem. The problem is resistant. Pertinent negatives include no chest pain, headaches, palpitations or shortness of breath. Past treatments include beta blockers and angiotensin blockers. The current treatment provides moderate improvement.  Neuropathic pain - he is on cymbalta 60 mg.  He reports having more leg pain and left arm tingling recently.  All of this is a result of [place] Nile virus infection years ago.  He has not seen neurology or pain management in several years.  Lab Results  Component Value Date   CREATININE 0.99 08/06/2020   BUN 15 08/06/2020   NA 137 08/06/2020   K 4.5 08/06/2020   CL 108 08/06/2020   CO2 22 08/06/2020   Lab Results  Component Value Date   CHOL 198 08/06/2020   HDL 48 08/06/2020   LDLCALC 121 (H) 08/06/2020   TRIG 146 08/06/2020   CHOLHDL 4.1 08/06/2020   Lab Results  Component Value Date   TSH 0.954 01/19/2016   No results found for: HGBA1C Lab Results  Component Value Date   WBC 5.2 08/06/2020   HGB 14.5 08/06/2020   HCT 44.2 08/06/2020   MCV 91.5 08/06/2020   PLT 192 08/06/2020   Lab Results  Component Value Date   ALT 22 08/06/2020   AST 22 08/06/2020   ALKPHOS 68 08/06/2020   BILITOT 0.7 08/06/2020     Review of Systems  Constitutional:  Negative for chills, fatigue and fever.  Respiratory:  Negative for chest tightness and shortness of breath.   Cardiovascular:  Negative for chest pain, palpitations and leg swelling.  Musculoskeletal:  Positive for myalgias. Negative for arthralgias.  Neurological:  Positive for tremors and numbness. Negative for dizziness, weakness and headaches.  Psychiatric/Behavioral:  Negative for dysphoric mood and sleep disturbance. The patient is not nervous/anxious.    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   Obesity, Class I, BMI 30-34.9 06/30/2015   Plantar fasciitis 10/21/2014   Essential hypertension 02/13/2014   Meralgia paresthetica of both lower extremities 02/13/2014   Palpitations 10/09/2011   Hyperlipidemia    MI (mitral incompetence) 02/07/2011   Obstructive sleep apnea syndrome 02/07/2011   Hx of colonic polyp 02/07/2011   Testosterone deficiency 02/07/2011   Tubular adenoma 02/07/2011    Allergies  Allergen Reactions   Gabapentin Other (See Comments)    Is not right, made see things   Topiramate Rash    Blistered rash on face    Past Surgical History:  Procedure Laterality Date   COLONOSCOPY WITH PROPOFOL N/A 10/10/2019   Procedure: COLONOSCOPY WITH BIOPSY;  Surgeon: Lucilla Lame, MD;  Location: [facility];  Service: Endoscopy;  Laterality: N/A;  priority 3   HAND SURGERY Bilateral 2013   carpal tunnel both hands   POLYPECTOMY N/A 10/10/2019   Procedure: POLYPECTOMY;  Surgeon: Lucilla Lame, MD;  Location: [facility];  Service: Endoscopy;  Laterality: N/A;   ROTATOR CUFF REPAIR Bilateral 2013    Social History   Tobacco Use   Smoking status: Never   Smokeless tobacco: Never  Vaping Use   Vaping Use: Never  used  Substance Use Topics   Alcohol use: No   Drug use: No     Medication list has been reviewed and updated.  Current Meds  Medication Sig   CANNABIDIOL PO Take by mouth. Using daily for pain   DULoxetine (CYMBALTA) 30 MG capsule Take 1 capsule (30 mg total) by mouth daily. With cymbalta 60 mg   DULoxetine (CYMBALTA) 60 MG capsule TAKE 1 CAPSULE BY MOUTH EVERY DAY   metoprolol succinate (TOPROL-XL) 25 MG 24 hr tablet TAKE 1 TABLET (25 MG TOTAL) BY MOUTH AT BEDTIME. TAKE WITH OR IMMEDIATELY FOLLOWING A MEAL.   Multiple Vitamin (MULTIVITAMIN) LIQD  Take 5 mLs by mouth daily.   NON FORMULARY at bedtime. CPAP nightly   [DISCONTINUED] losartan (COZAAR) 50 MG tablet Take 1 tablet (50 mg total) by mouth daily.    PHQ 2/9 Scores 02/01/2021 01/26/2021 07/28/2020 01/07/2020  PHQ - 2 Score 2 0 0 0  PHQ- 9 Score 4 - 0 -    GAD 7 : Generalized Anxiety Score 02/01/2021 07/28/2020 09/08/2019  Nervous, Anxious, on Edge 0 0 0  Control/stop worrying 0 0 0  Worry too much - different things 0 0 0  Trouble relaxing 0 0 0  Restless 0 0 0  Easily annoyed or irritable 0 0 0  Afraid - awful might happen 0 0 0  Total GAD 7 Score 0 0 0  Anxiety Difficulty - - Not difficult at all    BP Readings from Last 3 Encounters:  02/01/21 124/90  07/28/20 128/88  10/10/19 (!) 121/98    Physical Exam Vitals and nursing note reviewed.  Constitutional:      General: He is not in acute distress.    Appearance: He is well-developed.  HENT:     Head: Normocephalic and atraumatic.  Cardiovascular:     Rate and Rhythm: Normal rate and regular rhythm.     Pulses: Normal pulses.  Pulmonary:     Effort: Pulmonary effort is normal. No respiratory distress.     Breath sounds: No wheezing or rhonchi.  Musculoskeletal:     Cervical back: Normal range of motion.     Right lower leg: No edema.     Left lower leg: No edema.  Skin:    General: Skin is warm and dry.     Capillary Refill: Capillary refill takes less than 2 seconds.     Findings: No rash.  Neurological:     Mental Status: He is alert and oriented to person, place, and time. Mental status is at baseline.  Psychiatric:        Mood and Affect: Mood normal.        Behavior: Behavior normal.    Wt Readings from Last 3 Encounters:  02/01/21 222 lb (100.7 kg)  07/28/20 222 lb (100.7 kg)  10/10/19 211 lb 8 oz (95.9 kg)    BP 124/90   Pulse 60   Temp 98.2 F (36.8 C) (Oral)   Ht '5\' 11"'$  (1.803 m)   Wt 222 lb (100.7 kg)   SpO2 95%   BMI 30.96 kg/m   Assessment and Plan: 1. Essential  hypertension Clinically stable exam with well controlled BP. Tolerating medications without side effects at this time. Pt to continue current regimen and low sodium diet; benefits of regular exercise as able discussed. - losartan (COZAAR) 50 MG tablet; Take 1 tablet (50 mg total) by mouth daily.  Dispense: 90 tablet; Refill: 1  2. West Nile virus infection with  other neurologic manifestation More tremor and left arm symptoms recently Will increase cymbalta to 90 mg per day Re-evaluate in 2 months - DULoxetine (CYMBALTA) 30 MG capsule; Take 1 capsule (30 mg total) by mouth daily. With cymbalta 60 mg  Dispense: 90 capsule; Refill: 1  3. Need for immunization against influenza - Flu Vaccine QUAD 40moIM (Fluarix, Fluzone & Alfiuria Quad PF)   Partially dictated using DEditor, commissioning Any errors are unintentional.  LHalina Maidens MD [facility]  02/01/2021

## 2021-04-04 ENCOUNTER — Ambulatory Visit: Payer: Medicare Other | Admitting: Internal Medicine

## 2021-04-12 ENCOUNTER — Other Ambulatory Visit: Payer: Self-pay

## 2021-04-12 ENCOUNTER — Encounter: Payer: Self-pay | Admitting: Internal Medicine

## 2021-04-12 ENCOUNTER — Ambulatory Visit (INDEPENDENT_AMBULATORY_CARE_PROVIDER_SITE_OTHER): Payer: Medicare Other | Admitting: Internal Medicine

## 2021-04-12 VITALS — BP 124/78 | HR 95 | Temp 100.6°F | Ht 71.0 in | Wt 222.0 lb

## 2021-04-12 DIAGNOSIS — J069 Acute upper respiratory infection, unspecified: Secondary | ICD-10-CM | POA: Diagnosis not present

## 2021-04-12 LAB — POCT INFLUENZA A/B
Influenza A, POC: NEGATIVE
Influenza B, POC: NEGATIVE

## 2021-04-12 LAB — POC COVID19 BINAXNOW: SARS Coronavirus 2 Ag: NEGATIVE

## 2021-04-12 MED ORDER — PROMETHAZINE-DM 6.25-15 MG/5ML PO SYRP: 5.0000 mL | ORAL_SOLUTION | Freq: Four times a day (QID) | ORAL | 0 refills | Status: DC | PRN

## 2021-04-12 NOTE — Patient Instructions (Signed)
Take Tylenol 2 tablets every 8 hours for the next few days.  Take the cough syrup prescription at bedtime to give better sleep.  Drink lots of fluids, some with sugar in them like gingerale or apple juice.  Gatorade is also good.

## 2021-04-12 NOTE — Progress Notes (Signed)
Date:  04/12/2021   Name:  Jay Ford   DOB:  [birth year]   MRN:  [id number]   Chief Complaint: Cough  Cough This is a new problem. The current episode started in the past 7 days. The cough is Non-productive. Associated symptoms include chills, a fever, myalgias and shortness of breath. Pertinent negatives include no chest pain.   Lab Results  Component Value Date   CREATININE 0.99 08/06/2020   BUN 15 08/06/2020   NA 137 08/06/2020   K 4.5 08/06/2020   CL 108 08/06/2020   CO2 22 08/06/2020   Lab Results  Component Value Date   CHOL 198 08/06/2020   HDL 48 08/06/2020   LDLCALC 121 (H) 08/06/2020   TRIG 146 08/06/2020   CHOLHDL 4.1 08/06/2020   Lab Results  Component Value Date   TSH 0.954 01/19/2016   No results found for: HGBA1C Lab Results  Component Value Date   WBC 5.2 08/06/2020   HGB 14.5 08/06/2020   HCT 44.2 08/06/2020   MCV 91.5 08/06/2020   PLT 192 08/06/2020   Lab Results  Component Value Date   ALT 22 08/06/2020   AST 22 08/06/2020   ALKPHOS 68 08/06/2020   BILITOT 0.7 08/06/2020   No components found for: VITD  Review of Systems  Constitutional:  Positive for chills, fatigue and fever.  Respiratory:  Positive for cough and shortness of breath.   Cardiovascular:  Negative for chest pain and leg swelling.  Gastrointestinal:  Negative for diarrhea, nausea and rectal pain.  Musculoskeletal:  Positive for myalgias. Negative for arthralgias.  Psychiatric/Behavioral:  Positive for sleep disturbance. Negative for dysphoric mood. The patient is not nervous/anxious.    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   Obesity, Class I, BMI 30-34.9 06/30/2015   Plantar fasciitis 10/21/2014   Essential hypertension 02/13/2014   Meralgia  paresthetica of both lower extremities 02/13/2014   Palpitations 10/09/2011   Hyperlipidemia    MI (mitral incompetence) 02/07/2011   Obstructive sleep apnea syndrome 02/07/2011   Hx of colonic polyp 02/07/2011   Testosterone deficiency 02/07/2011   Tubular adenoma 02/07/2011    Allergies  Allergen Reactions   Gabapentin Other (See Comments)    Is not right, made see things   Topiramate Rash    Blistered rash on face    Past Surgical History:  Procedure Laterality Date   COLONOSCOPY WITH PROPOFOL N/A 10/10/2019   Procedure: COLONOSCOPY WITH BIOPSY;  Surgeon: Lucilla Lame, MD;  Location: [place];  Service: Endoscopy;  Laterality: N/A;  priority 3   HAND SURGERY Bilateral 2013   carpal tunnel both hands   POLYPECTOMY N/A 10/10/2019   Procedure: POLYPECTOMY;  Surgeon: Lucilla Lame, MD;  Location: [place];  Service: Endoscopy;  Laterality: N/A;   ROTATOR CUFF REPAIR Bilateral 2013    Social History   Tobacco Use   Smoking status: Never   Smokeless tobacco: Never  Vaping Use   Vaping Use: Never used  Substance Use Topics   Alcohol use: No   Drug use: No     Medication list has been reviewed and updated.  Current Meds  Medication Sig   CANNABIDIOL PO Take by mouth. Using daily for pain   DULoxetine (CYMBALTA) 30 MG capsule Take  1 capsule (30 mg total) by mouth daily. With cymbalta 60 mg   DULoxetine (CYMBALTA) 60 MG capsule TAKE 1 CAPSULE BY MOUTH EVERY DAY   losartan (COZAAR) 50 MG tablet Take 1 tablet (50 mg total) by mouth daily.   metoprolol succinate (TOPROL-XL) 25 MG 24 hr tablet TAKE 1 TABLET (25 MG TOTAL) BY MOUTH AT BEDTIME. TAKE WITH OR IMMEDIATELY FOLLOWING A MEAL.   Multiple Vitamin (MULTIVITAMIN) LIQD Take 5 mLs by mouth daily.   NON FORMULARY at bedtime. CPAP nightly    PHQ 2/9 Scores 04/12/2021 02/01/2021 01/26/2021 07/28/2020  PHQ - 2 Score 1 2 0 0  PHQ- 9 Score 2 4 - 0    GAD 7 : Generalized Anxiety Score 04/12/2021 02/01/2021  07/28/2020 09/08/2019  Nervous, Anxious, on Edge 0 0 0 0  Control/stop worrying 0 0 0 0  Worry too much - different things 0 0 0 0  Trouble relaxing 0 0 0 0  Restless 0 0 0 0  Easily annoyed or irritable 0 0 0 0  Afraid - awful might happen 0 0 0 0  Total GAD 7 Score 0 0 0 0  Anxiety Difficulty Not difficult at all - - Not difficult at all    BP Readings from Last 3 Encounters:  04/12/21 124/78  02/01/21 124/90  07/28/20 128/88    Physical Exam Constitutional:      Appearance: He is ill-appearing.  HENT:     Right Ear: Tympanic membrane and ear canal normal.     Left Ear: Tympanic membrane and ear canal normal.     Nose: Nose normal.     Mouth/Throat:     Mouth: Mucous membranes are moist.     Pharynx: No oropharyngeal exudate or posterior oropharyngeal erythema.  Cardiovascular:     Rate and Rhythm: Normal rate and regular rhythm.     Pulses: Normal pulses.  Pulmonary:     Effort: Pulmonary effort is normal.     Breath sounds: Normal breath sounds. No wheezing or rhonchi.  Musculoskeletal:     Cervical back: Normal range of motion.  Lymphadenopathy:     Cervical: No cervical adenopathy.  Skin:    Capillary Refill: Capillary refill takes less than 2 seconds.  Neurological:     General: No focal deficit present.     Mental Status: He is alert.  Psychiatric:        Mood and Affect: Mood normal.        Behavior: Behavior normal.    Wt Readings from Last 3 Encounters:  04/12/21 222 lb (100.7 kg)  02/01/21 222 lb (100.7 kg)  07/28/20 222 lb (100.7 kg)    BP 124/78   Pulse 95   Temp (!) 100.6 F (38.1 C) (Oral)   Ht 5\' 11"  (1.803 m)   Wt 222 lb (100.7 kg)   SpO2 99%   BMI 30.96 kg/m   Assessment and Plan: 1. Upper respiratory tract infection, unspecified type Possible flu but he is outside the 48 hour treatment window. Recommend Tylenol every 8 hours, increased fluids, rest. Cough medication to help with sleep. Lung exam benign. -  promethazine-dextromethorphan (PROMETHAZINE-DM) 6.25-15 MG/5ML syrup; Take 5 mLs by mouth 4 (four) times daily as needed for cough.  Dispense: 180 mL; Refill: 0 - POCT Influenza A/B - negative - POC COVID-19 BinaxNow - negative   Partially dictated using Editor, commissioning. Any errors are unintentional.  Halina Maidens, MD [facility]  04/12/2021

## 2021-04-13 ENCOUNTER — Other Ambulatory Visit: Payer: Self-pay | Admitting: Internal Medicine

## 2021-04-13 DIAGNOSIS — I10 Essential (primary) hypertension: Secondary | ICD-10-CM

## 2021-04-13 NOTE — Telephone Encounter (Signed)
Requested Prescriptions  Pending Prescriptions Disp Refills  . metoprolol succinate (TOPROL-XL) 25 MG 24 hr tablet [Pharmacy Med Name: METOPROLOL SUCC ER 25 MG TAB] 90 tablet 0    Sig: TAKE 1 TABLET (25 MG TOTAL) BY MOUTH AT BEDTIME. TAKE WITH OR IMMEDIATELY FOLLOWING A MEAL.     Cardiovascular:  Beta Blockers Passed - 04/13/2021  1:32 PM      Passed - Last BP in normal range    BP Readings from Last 1 Encounters:  04/12/21 124/78         Passed - Last Heart Rate in normal range    Pulse Readings from Last 1 Encounters:  04/12/21 95         Passed - Valid encounter within last 6 months    Recent Outpatient Visits          Yesterday Upper respiratory tract infection, unspecified type   [facility] Glean Hess, MD   2 months ago Essential hypertension   [place], Laura H, MD   8 months ago Essential hypertension   [facility] Glean Hess, MD   1 year ago Annual physical exam   [facility] Glean Hess, MD   2 years ago Essential hypertension   [facility] Glean Hess, MD

## 2021-04-17 ENCOUNTER — Other Ambulatory Visit: Payer: Self-pay | Admitting: Internal Medicine

## 2021-04-17 DIAGNOSIS — A9232 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: Secondary | ICD-10-CM

## 2021-04-18 NOTE — Telephone Encounter (Signed)
Requested Prescriptions  Pending Prescriptions Disp Refills  . DULoxetine (CYMBALTA) 60 MG capsule [Pharmacy Med Name: DULOXETINE HCL DR 60 MG CAP] 90 capsule 0    Sig: TAKE 1 CAPSULE BY MOUTH EVERY DAY     Psychiatry: Antidepressants - SNRI Passed - 04/17/2021  9:25 AM      Passed - Last BP in normal range    BP Readings from Last 1 Encounters:  04/12/21 124/78         Passed - Valid encounter within last 6 months    Recent Outpatient Visits          6 days ago Upper respiratory tract infection, unspecified type   [facility] Glean Hess, MD   2 months ago Essential hypertension   [place], MD   8 months ago Essential hypertension   [facility] Glean Hess, MD   1 year ago Annual physical exam   [facility] Glean Hess, MD   3 years ago Essential hypertension   [facility] Glean Hess, MD

## 2021-05-19 LAB — HM HEPATITIS C SCREENING LAB: HM Hepatitis Screen: NEGATIVE

## 2021-05-25 ENCOUNTER — Encounter: Payer: Self-pay | Admitting: Internal Medicine

## 2021-07-11 ENCOUNTER — Ambulatory Visit: Payer: Self-pay | Admitting: *Deleted

## 2021-07-11 NOTE — Telephone Encounter (Signed)
°  Chief Complaint: patient's wife calling to schedule appt due to patient frequent falls Symptoms: left leg "gives out" weaker, dizziness, tremors worsening,  tastes "metal" . Falls happening more often. Last fall yesterday patient refused to get evaluated. Requesting neurology referral and get handicap sticker.  Frequency: yesterday  Pertinent Negatives: Patient denies wife denies speech difficulty, any other neurological symptoms .  Disposition: [] ED /[] Urgent Care (no appt availability in office) / [x] Appointment(In office/virtual)/ []  [facility]/ [] Home Care/ [] Refused Recommended Disposition /[] [place] [facility]/ []  Follow-up with PCP Additional Notes:  Recommended to wife if patient falls again and dizziness reported go to ED. Wife not with patient now and reports patient refused to go to ED and refused appt with PCP this week. Please advise if recommending patient to be seen this week. Encouraged wife to call back to change appt to this week if patient agrees.    Reason for Disposition  [1] Falling (two or more falls) AND [2] in past year  Answer Assessment - Initial Assessment Questions 1. MECHANISM: "How did the fall happen?"     Just standing in kitchen and fell per patient's wife Sharyn Lull. 2. DOMESTIC VIOLENCE AND ELDER ABUSE SCREENING: "Did you fall because someone pushed you or tried to hurt you?" If Yes, ask: "Are you safe now?"     na 3. ONSET: "When did the fall happen?" (e.g., minutes, hours, or days ago)     [place]  and has had multiple falls recently  4. LOCATION: "What part of the body hit the ground?" (e.g., back, buttocks, head, hips, knees, hands, head, stomach)     Na . Denies hitting head  5. INJURY: "Did you hurt (injure) yourself when you fell?" If Yes, ask: "What did you injure? Tell me more about this?" (e.g., body area; type of injury; pain severity)"     No  6. PAIN: "Is there any pain?" If Yes, ask: "How bad is the pain?" (e.g., Scale 1-10;  or mild,  moderate, severe)   - NONE (0): No pain   - MILD (1-3): Doesn't interfere with normal activities    - MODERATE (4-7): Interferes with normal activities or awakens from sleep    - SEVERE (8-10): Excruciating pain, unable to do any normal activities      na 7. SIZE: For cuts, bruises, or swelling, ask: "How large is it?" (e.g., inches or centimeters)      na 8. PREGNANCY: "Is there any chance you are pregnant?" "When was your last menstrual period?"     na 9. OTHER SYMPTOMS: "Do you have any other symptoms?" (e.g., dizziness, fever, weakness; new onset or worsening).      Dizziness left leg "gives out". C/o tasting "metal'. 10. CAUSE: "What do you think caused the fall (or falling)?" (e.g., tripped, dizzy spell)       Left leg gives out .  Protocols used: Falls and [facility]

## 2021-07-14 ENCOUNTER — Other Ambulatory Visit: Payer: Self-pay | Admitting: Internal Medicine

## 2021-07-14 DIAGNOSIS — A9232 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: Secondary | ICD-10-CM

## 2021-07-14 DIAGNOSIS — I10 Essential (primary) hypertension: Secondary | ICD-10-CM

## 2021-07-14 NOTE — Telephone Encounter (Signed)
Requested Prescriptions  Pending Prescriptions Disp Refills   metoprolol succinate (TOPROL-XL) 25 MG 24 hr tablet [Pharmacy Med Name: METOPROLOL SUCC ER 25 MG TAB] 90 tablet 0    Sig: TAKE 1 TABLET (25 MG TOTAL) BY MOUTH AT BEDTIME. TAKE WITH OR IMMEDIATELY FOLLOWING A MEAL.     Cardiovascular:  Beta Blockers Passed - 07/14/2021  1:49 AM      Passed - Last BP in normal range    BP Readings from Last 1 Encounters:  04/12/21 124/78         Passed - Last Heart Rate in normal range    Pulse Readings from Last 1 Encounters:  04/12/21 95         Passed - Valid encounter within last 6 months    Recent Outpatient Visits          3 months ago Upper respiratory tract infection, unspecified type   [facility] Glean Hess, MD   5 months ago Essential hypertension   [facility] Glean Hess, MD   11 months ago Essential hypertension   [facility] Glean Hess, MD   1 year ago Annual physical exam   [facility] Glean Hess, MD   3 years ago Essential hypertension   [place], Laura H, MD      Future Appointments            In 4 days Glean Hess, MD [facility]            DULoxetine (CYMBALTA) 60 MG capsule [Pharmacy Med Name: DULOXETINE HCL DR 60 MG CAP] 90 capsule 0    Sig: TAKE 1 [place]     Psychiatry: Antidepressants - SNRI - duloxetine Passed - 07/14/2021  1:49 AM      Passed - Cr in normal range and within 360 days    Creatinine, Ser  Date Value Ref Range Status  08/06/2020 0.99 0.61 - 1.24 mg/dL Final         Passed - eGFR is 30 or above and within 360 days    GFR calc Af Amer  Date Value Ref Range Status  09/08/2019 91 >59 mL/min/1.73 Final   GFR, Estimated  Date Value Ref Range Status  08/06/2020 >60 >60 mL/min Final    Comment:    (NOTE) Calculated using the CKD-EPI Creatinine Equation (2021)          Passed - Completed PHQ-2 or PHQ-9  in the last 360 days      Passed - Last BP in normal range    BP Readings from Last 1 Encounters:  04/12/21 124/78         Passed - Valid encounter within last 6 months    Recent Outpatient Visits          3 months ago Upper respiratory tract infection, unspecified type   [facility] Glean Hess, MD   5 months ago Essential hypertension   [place], Laura H, MD   11 months ago Essential hypertension   [facility] Glean Hess, MD   1 year ago Annual physical exam   [facility] Glean Hess, MD   3 years ago Essential hypertension   [place], Laura H, MD      Future Appointments            In 4 days Glean Hess,  MD [place]

## 2021-07-18 ENCOUNTER — Other Ambulatory Visit: Payer: Self-pay | Admitting: Internal Medicine

## 2021-07-18 ENCOUNTER — Ambulatory Visit: Payer: Medicare Other | Admitting: Internal Medicine

## 2021-07-19 ENCOUNTER — Ambulatory Visit
Admission: RE | Admit: 2021-07-19 | Discharge: 2021-07-19 | Disposition: A | Discharge status: Home / Self Care | Payer: Medicare Other | Attending: Internal Medicine | Admitting: Internal Medicine

## 2021-07-19 ENCOUNTER — Ambulatory Visit (INDEPENDENT_AMBULATORY_CARE_PROVIDER_SITE_OTHER): Payer: Medicare Other | Admitting: Internal Medicine

## 2021-07-19 ENCOUNTER — Ambulatory Visit
Admission: RE | Admit: 2021-07-19 | Discharge: 2021-07-19 | Disposition: A | Discharge status: Home / Self Care | Payer: Medicare Other | Source: Ambulatory Visit | Attending: Internal Medicine | Admitting: Internal Medicine

## 2021-07-19 ENCOUNTER — Encounter: Payer: Self-pay | Admitting: Internal Medicine

## 2021-07-19 ENCOUNTER — Other Ambulatory Visit: Payer: Self-pay

## 2021-07-19 VITALS — BP 112/78 | HR 71 | Ht 71.0 in | Wt 233.8 lb

## 2021-07-19 DIAGNOSIS — M5410 Radiculopathy, site unspecified: Secondary | ICD-10-CM | POA: Insufficient documentation

## 2021-07-19 DIAGNOSIS — M545 Low back pain, unspecified: Secondary | ICD-10-CM | POA: Diagnosis not present

## 2021-07-19 NOTE — Progress Notes (Signed)
Date:  07/19/2021   Name:  Jay Ford   DOB:  November 19, 1960   MRN:  [id number]   Chief Complaint: Fall (Frequent Falls. Left leg gives out- goes numb and tingles. Happens frequently. )  HPI Falls - has occurred over the past 6 months about 6 times.  In left leg - gets a sensation of tingling in the left groin that goes down his leg and then it gives way.  He recovers quickly and is able to get up.  Sometimes he can catch the wall and not fall. It is worse when he is tired.  He has not been injured by any fall.  He denies perineal or genital numbness or loss bowel or bladder control.  Lab Results  Component Value Date   NA 137 08/06/2020   K 4.5 08/06/2020   CO2 22 08/06/2020   GLUCOSE 104 (H) 08/06/2020   BUN 15 08/06/2020   CREATININE 0.99 08/06/2020   CALCIUM 8.9 08/06/2020   GFRNONAA >60 08/06/2020   Lab Results  Component Value Date   CHOL 198 08/06/2020   HDL 48 08/06/2020   LDLCALC 121 (H) 08/06/2020   TRIG 146 08/06/2020   CHOLHDL 4.1 08/06/2020   Lab Results  Component Value Date   TSH 0.954 01/19/2016   No results found for: HGBA1C Lab Results  Component Value Date   WBC 5.2 08/06/2020   HGB 14.5 08/06/2020   HCT 44.2 08/06/2020   MCV 91.5 08/06/2020   PLT 192 08/06/2020   Lab Results  Component Value Date   ALT 22 08/06/2020   AST 22 08/06/2020   ALKPHOS 68 08/06/2020   BILITOT 0.7 08/06/2020   No results found for: 25OHVITD2, 25OHVITD3, VD25OH   Review of Systems  Constitutional:  Negative for chills, fatigue and fever.  Respiratory:  Negative for chest tightness and shortness of breath.   Cardiovascular:  Negative for chest pain.  Musculoskeletal:  Negative for back pain and myalgias.  Neurological:  Positive for weakness and numbness. Negative for dizziness, syncope, light-headedness and headaches.   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   Obesity, Class I, BMI 30-34.9 06/30/2015   Plantar fasciitis 10/21/2014   Essential hypertension 02/13/2014   Meralgia paresthetica of both lower extremities 02/13/2014   Palpitations 10/09/2011   Hyperlipidemia    MI (mitral incompetence) 02/07/2011   Obstructive sleep apnea syndrome 02/07/2011   Hx of colonic polyp 02/07/2011   Testosterone deficiency 02/07/2011   Tubular adenoma 02/07/2011    Allergies  Allergen Reactions   Gabapentin Other (See Comments)    Is not right, made see things   Topiramate Rash    Blistered rash on face    Past Surgical History:  Procedure Laterality Date   COLONOSCOPY WITH PROPOFOL N/A 10/10/2019   Procedure: COLONOSCOPY WITH BIOPSY;  Surgeon: Lucilla Lame, MD;  Location: [place];  Service: Endoscopy;  Laterality: N/A;  priority 3   HAND SURGERY Bilateral 2013   carpal tunnel both hands   POLYPECTOMY N/A 10/10/2019   Procedure: POLYPECTOMY;  Surgeon: Lucilla Lame, MD;  Location: [place];  Service: Endoscopy;  Laterality: N/A;   ROTATOR CUFF REPAIR Bilateral 2013    Social History   Tobacco Use   Smoking status: Never  Smokeless tobacco: Never  Vaping Use   Vaping Use: Never used  Substance Use Topics   Alcohol use: No   Drug use: No     Medication list has been reviewed and updated.  Current Meds  Medication Sig   CANNABIDIOL PO Take by mouth. Using daily for pain   DULoxetine (CYMBALTA) 30 MG capsule Take 1 capsule (30 mg total) by mouth daily. With cymbalta 60 mg   DULoxetine (CYMBALTA) 60 MG capsule TAKE 1 CAPSULE BY MOUTH EVERY DAY   losartan (COZAAR) 50 MG tablet Take 1 tablet (50 mg total) by mouth daily.   metoprolol succinate (TOPROL-XL) 25 MG 24 hr tablet TAKE 1 TABLET (25 MG TOTAL) BY MOUTH AT BEDTIME. TAKE WITH OR IMMEDIATELY FOLLOWING A MEAL.   Multiple Vitamin (MULTIVITAMIN) LIQD  Take 5 mLs by mouth daily.   NON FORMULARY at bedtime. CPAP nightly    PHQ 2/9 Scores 07/19/2021 04/12/2021 02/01/2021 01/26/2021  PHQ - 2 Score 1 1 2  0  PHQ- 9 Score 2 2 4  -    GAD 7 : Generalized Anxiety Score 07/19/2021 04/12/2021 02/01/2021 07/28/2020  Nervous, Anxious, on Edge 0 0 0 0  Control/stop worrying 0 0 0 0  Worry too much - different things 0 0 0 0  Trouble relaxing 0 0 0 0  Restless 0 0 0 0  Easily annoyed or irritable 0 0 0 0  Afraid - awful might happen 0 0 0 0  Total GAD 7 Score 0 0 0 0  Anxiety Difficulty Not difficult at all Not difficult at all - -    BP Readings from Last 3 Encounters:  07/19/21 112/78  04/12/21 124/78  02/01/21 124/90    Physical Exam Vitals and nursing note reviewed.  Constitutional:      General: He is not in acute distress.    Appearance: Normal appearance. He is well-developed.  HENT:     Head: Normocephalic and atraumatic.  Cardiovascular:     Rate and Rhythm: Normal rate and regular rhythm.     Heart sounds: No murmur heard. Pulmonary:     Effort: Pulmonary effort is normal. No respiratory distress.     Breath sounds: No wheezing or rhonchi.  Musculoskeletal:     Cervical back: Normal range of motion.     Lumbar back: Normal. No tenderness. Negative right straight leg raise test and negative left straight leg raise test.     Right hip: No tenderness. Normal range of motion.     Left hip: No tenderness. Normal range of motion.     Right lower leg: No edema.     Left lower leg: No edema.  Skin:    General: Skin is warm and dry.     Findings: No rash.  Neurological:     Mental Status: He is alert and oriented to person, place, and time.     Motor: Motor function is intact. No weakness or atrophy.     Deep Tendon Reflexes:     Reflex Scores:      Patellar reflexes are 3+ on the right side and 3+ on the left side. Psychiatric:        Mood and Affect: Mood normal.        Behavior: Behavior normal.    Wt Readings from Last 3  Encounters:  07/19/21 233 lb 12.8 oz (106.1 kg)  04/12/21 222 lb (100.7 kg)  02/01/21 222 lb (100.7 kg)    BP 112/78    Pulse 71  Ht 5\' 11"  (1.803 m)    Wt 233 lb 12.8 oz (106.1 kg)    SpO2 97%    BMI 32.61 kg/m   Assessment and Plan: 1. Radiculopathy with lower extremity symptoms Intermittent LLE tingling and transient weakness; increasing episodes since Covid-19 infection in 2021. Now causing falls.  He is not especially eager for evaluation at this time. We discussed using a cane or walking stick for support for prolonged standing/walking which sees to trigger his sx. Will get lumbar films and consider MRI if symptoms recur. - DG Lumbar Spine Complete   Partially dictated using Editor, commissioning. Any errors are unintentional.  Halina Maidens, MD [facility]  07/19/2021

## 2021-07-21 ENCOUNTER — Other Ambulatory Visit: Payer: Self-pay | Admitting: Internal Medicine

## 2021-07-21 ENCOUNTER — Telehealth: Payer: Self-pay

## 2021-07-21 DIAGNOSIS — M5410 Radiculopathy, site unspecified: Secondary | ICD-10-CM

## 2021-07-21 DIAGNOSIS — A9232 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: Secondary | ICD-10-CM

## 2021-07-21 NOTE — Telephone Encounter (Signed)
Please review. Pts wife called in wanting a MRI for pt. ? ?KP

## 2021-07-21 NOTE — Telephone Encounter (Signed)
Copied from [place] [id number]. Topic: General - Other ?>> Jul 21, 2021 12:31 PM Jay Ford wrote: ?Reason for CRM: Pt wife stated pt needs to be scheduled for a MRI but she would like it to be with an open MRI machine due to pt being claustrophobic. Pt wife requests call back ?

## 2021-07-21 NOTE — Telephone Encounter (Signed)
Spoke to wife let her know a MRI was ordered that some one will call her to schedule an appt. She verbalized understanding. ? ?KP ?

## 2021-08-01 ENCOUNTER — Ambulatory Visit
Admission: RE | Admit: 2021-08-01 | Discharge: 2021-08-01 | Disposition: A | Discharge status: Home / Self Care | Payer: Medicare Other | Source: Ambulatory Visit | Attending: Internal Medicine | Admitting: Internal Medicine

## 2021-08-01 DIAGNOSIS — M5127 Other intervertebral disc displacement, lumbosacral region: Secondary | ICD-10-CM | POA: Diagnosis not present

## 2021-08-01 DIAGNOSIS — M5410 Radiculopathy, site unspecified: Secondary | ICD-10-CM | POA: Diagnosis not present

## 2021-08-01 DIAGNOSIS — M47816 Spondylosis without myelopathy or radiculopathy, lumbar region: Secondary | ICD-10-CM | POA: Diagnosis not present

## 2021-08-01 DIAGNOSIS — A9232 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: Secondary | ICD-10-CM | POA: Insufficient documentation

## 2021-08-02 ENCOUNTER — Other Ambulatory Visit: Payer: Self-pay

## 2021-08-02 ENCOUNTER — Other Ambulatory Visit: Payer: Self-pay | Admitting: Internal Medicine

## 2021-08-02 DIAGNOSIS — M5410 Radiculopathy, site unspecified: Secondary | ICD-10-CM

## 2021-08-02 DIAGNOSIS — A9232 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: Secondary | ICD-10-CM

## 2021-08-02 DIAGNOSIS — M5136 Other intervertebral disc degeneration, lumbar region: Secondary | ICD-10-CM

## 2021-08-02 NOTE — Telephone Encounter (Signed)
Requested Prescriptions  ?Pending Prescriptions Disp Refills  ?? DULoxetine (CYMBALTA) 30 MG capsule [Pharmacy Med Name: DULOXETINE HCL DR 30 MG CAP] 90 capsule 1  ?  Sig: TAKE 1 CAPSULE (30 MG TOTAL) BY MOUTH DAILY. WITH CYMBALTA 60 MG  ?  ? Psychiatry: Antidepressants - SNRI - duloxetine Failed - 08/02/2021  2:03 AM  ?  ?  Failed - Cr in normal range and within 360 days  ?  Creatinine, Ser  ?Date Value Ref Range Status  ?08/06/2020 0.99 0.61 - 1.24 mg/dL Final  ?   ?  ?  Failed - eGFR is 30 or above and within 360 days  ?  GFR calc Af Amer  ?Date Value Ref Range Status  ?09/08/2019 91 >59 mL/min/1.73 Final  ? ?GFR, Estimated  ?Date Value Ref Range Status  ?08/06/2020 >60 >60 mL/min Final  ?  Comment:  ?  (NOTE) ?Calculated using the CKD-EPI Creatinine Equation (2021) ?  ?   ?  ?  Passed - Completed PHQ-2 or PHQ-9 in the last 360 days  ?  ?  Passed - Last BP in normal range  ?  BP Readings from Last 1 Encounters:  ?07/19/21 112/78  ?   ?  ?  Passed - Valid encounter within last 6 months  ?  Recent Outpatient Visits   ?      ? 2 weeks ago Radiculopathy with lower extremity symptoms  ? [facility] Glean Hess, MD  ? 3 months ago Upper respiratory tract infection, unspecified type  ? [facility] Glean Hess, MD  ? 6 months ago Essential hypertension  ? [facility] Glean Hess, MD  ? 1 year ago Essential hypertension  ? [facility] Glean Hess, MD  ? 1 year ago Annual physical exam  ? [facility] Glean Hess, MD  ?  ?  ? ?  ?  ?  ? ? ?

## 2021-08-02 NOTE — Progress Notes (Signed)
ne

## 2021-08-04 ENCOUNTER — Telehealth: Payer: Self-pay | Admitting: Internal Medicine

## 2021-08-04 NOTE — Telephone Encounter (Signed)
Pt's wife called in has questions about Mri. Please call back ?

## 2021-08-04 NOTE — Telephone Encounter (Signed)
Spoke to pts wife let her know that a referral was placed for a neurosurgeon that she would need to call them to schedule an appt. She stated she has talked to them and they told her they will call her back within 48 hours so Dr. Cari Caraway can review the xray and MRI. Told her to call again tomorrow. She verbalized understanding. ? ?KP ?

## 2021-08-11 DIAGNOSIS — G4733 Obstructive sleep apnea (adult) (pediatric): Secondary | ICD-10-CM | POA: Diagnosis not present

## 2021-08-11 DIAGNOSIS — Z8616 Personal history of COVID-19: Secondary | ICD-10-CM | POA: Diagnosis not present

## 2021-08-11 DIAGNOSIS — R413 Other amnesia: Secondary | ICD-10-CM | POA: Diagnosis not present

## 2021-08-11 DIAGNOSIS — R7309 Other abnormal glucose: Secondary | ICD-10-CM | POA: Diagnosis not present

## 2021-08-11 DIAGNOSIS — Z8673 Personal history of transient ischemic attack (TIA), and cerebral infarction without residual deficits: Secondary | ICD-10-CM | POA: Diagnosis not present

## 2021-08-11 DIAGNOSIS — E559 Vitamin D deficiency, unspecified: Secondary | ICD-10-CM | POA: Diagnosis not present

## 2021-08-11 DIAGNOSIS — R259 Unspecified abnormal involuntary movements: Secondary | ICD-10-CM | POA: Diagnosis not present

## 2021-08-11 DIAGNOSIS — Z8619 Personal history of other infectious and parasitic diseases: Secondary | ICD-10-CM | POA: Diagnosis not present

## 2021-08-11 DIAGNOSIS — R202 Paresthesia of skin: Secondary | ICD-10-CM | POA: Diagnosis not present

## 2021-08-11 LAB — BASIC METABOLIC PANEL
BUN: 16 (ref 4–21)
CO2: 24 — AB (ref 13–22)
Creatinine: 0.9 (ref 0.6–1.3)
Glucose: 108
Potassium: 4.2 mEq/L (ref 3.5–5.1)
Sodium: 138 (ref 137–147)

## 2021-08-11 LAB — HIV ANTIBODY (ROUTINE TESTING W REFLEX): HIV 1&2 Ab, 4th Generation: NEGATIVE

## 2021-08-11 LAB — COMPREHENSIVE METABOLIC PANEL
Calcium: 9.4 (ref 8.7–10.7)
eGFR: 86

## 2021-08-11 LAB — HEPATIC FUNCTION PANEL
ALT: 26 U/L (ref 10–40)
AST: 23 (ref 14–40)
Alkaline Phosphatase: 77 (ref 25–125)

## 2021-08-11 LAB — VITAMIN B12: Vitamin B-12: 182

## 2021-08-11 LAB — TSH: TSH: 1.83 (ref 0.41–5.90)

## 2021-08-11 LAB — HEMOGLOBIN A1C: Hemoglobin A1C: 5.9

## 2021-08-11 LAB — HM HIV SCREENING LAB: HM HIV Screening: NEGATIVE

## 2021-08-15 ENCOUNTER — Telehealth: Payer: Self-pay | Admitting: Internal Medicine

## 2021-08-15 NOTE — Telephone Encounter (Signed)
Spoke to wife. Told her that the labs that I can see I only see that the patient has low Vit B12. Pts wife stated that the pts cholesterol was out of range explain to her that I couldn't not see those labs results. Told her to also monitor the pts BP at home once or twice a day and let us know what the numbers are. She stated pts BP was 148/101 at the neurologist. Pt has had 2 falls recently. Pt follows up with neurology for these problems. Pts wife verbalized understanding. ? ? ?KP ?

## 2021-08-15 NOTE — Telephone Encounter (Signed)
Copied from [place] [phone]. Topic: General - Call Back - No Documentation ?>> Aug 15, 2021 12:00 PM Scherrie Gerlach wrote: ?Reason for CRM: wife Sharyn Lull would like Dr Carolin Coy nurse to call her back concerning pt's recent labs and falls the pt has had in the past 2 days.  The neuro surgeon at [facility] is treating this, but she wants Dr Carolin Coy to know.  She asked that you call her, b/c pt is not going to tell you everything that needs to be told. ?

## 2021-08-16 ENCOUNTER — Other Ambulatory Visit: Payer: Self-pay | Admitting: Neurology

## 2021-08-16 DIAGNOSIS — R202 Paresthesia of skin: Secondary | ICD-10-CM

## 2021-08-16 DIAGNOSIS — R259 Unspecified abnormal involuntary movements: Secondary | ICD-10-CM

## 2021-08-25 ENCOUNTER — Ambulatory Visit
Admission: RE | Admit: 2021-08-25 | Discharge: 2021-08-25 | Disposition: A | Discharge status: Home / Self Care | Payer: Medicare Other | Source: Ambulatory Visit | Attending: Neurology | Admitting: Neurology

## 2021-08-25 DIAGNOSIS — R251 Tremor, unspecified: Secondary | ICD-10-CM | POA: Diagnosis not present

## 2021-08-25 DIAGNOSIS — R202 Paresthesia of skin: Secondary | ICD-10-CM | POA: Insufficient documentation

## 2021-08-25 DIAGNOSIS — R259 Unspecified abnormal involuntary movements: Secondary | ICD-10-CM | POA: Diagnosis not present

## 2021-08-25 DIAGNOSIS — R200 Anesthesia of skin: Secondary | ICD-10-CM | POA: Diagnosis not present

## 2021-08-25 MED ORDER — GADOBUTROL 1 MMOL/ML IV SOLN
10.0000 mL | Freq: Once | INTRAVENOUS | Status: AC | PRN
  Administered 2021-08-25: 10 mL via INTRAVENOUS

## 2021-09-01 ENCOUNTER — Other Ambulatory Visit: Payer: Self-pay | Admitting: Internal Medicine

## 2021-09-01 DIAGNOSIS — I10 Essential (primary) hypertension: Secondary | ICD-10-CM

## 2021-09-01 MED ORDER — LOSARTAN POTASSIUM 50 MG PO TABS: 50.0000 mg | ORAL_TABLET | Freq: Every day | ORAL | 1 refills | Status: DC

## 2021-09-01 NOTE — Telephone Encounter (Signed)
Pt's wife called to request a refill of 2 of his blood pressure medications, she says she does not know the name of them.. requesting call back from PCP's nurse  ? ? ?[id number] [place] - [address]  [id number] ?Phone: [phone]: [phone] ? ?

## 2021-09-01 NOTE — Telephone Encounter (Signed)
Requested Prescriptions  ?Pending Prescriptions Disp Refills  ?? losartan (COZAAR) 50 MG tablet 90 tablet 1  ?  Sig: Take 1 tablet (50 mg total) by mouth daily.  ?  ? Cardiovascular:  Angiotensin Receptor Blockers Failed - 09/01/2021 11:06 AM  ?  ?  Failed - Cr in normal range and within 180 days  ?  Creatinine, Ser  ?Date Value Ref Range Status  ?08/06/2020 0.99 0.61 - 1.24 mg/dL Final  ?   ?  ?  Failed - K in normal range and within 180 days  ?  Potassium  ?Date Value Ref Range Status  ?08/06/2020 4.5 3.5 - 5.1 mmol/L Final  ?   ?  ?  Passed - Patient is not pregnant  ?  ?  Passed - Last BP in normal range  ?  BP Readings from Last 1 Encounters:  ?07/19/21 112/78  ?   ?  ?  Passed - Valid encounter within last 6 months  ?  Recent Outpatient Visits   ?      ? 1 month ago Radiculopathy with lower extremity symptoms  ? [facility] Glean Hess, MD  ? 4 months ago Upper respiratory tract infection, unspecified type  ? [facility] Glean [facility], [facility]  ? 7 months ago Essential hypertension  ? [facility] Glean Hess, MD  ? 1 year ago Essential hypertension  ? [facility] Glean Hess, MD  ? 1 year ago Annual physical exam  ? [facility] Glean Hess, MD  ?  ?  ? ?  ?  ?  ? ?

## 2021-09-01 NOTE — Telephone Encounter (Signed)
Call to patient:  ?Per Rx records- only Rx due now is Losartin. ?Patient states he has not gotten additional dose of Duloxetine and request call to pharmacy to verify received and request fill for pick up. ?Call to pharmacy: Duloxetine 30 mg is on file- they will get ready for pick up. ?Losartan sent to RF que ?

## 2021-09-05 DIAGNOSIS — R29898 Other symptoms and signs involving the musculoskeletal system: Secondary | ICD-10-CM | POA: Diagnosis not present

## 2021-09-09 DIAGNOSIS — R569 Unspecified convulsions: Secondary | ICD-10-CM | POA: Diagnosis not present

## 2021-10-10 DIAGNOSIS — E569 Vitamin deficiency, unspecified: Secondary | ICD-10-CM | POA: Diagnosis not present

## 2021-10-10 DIAGNOSIS — R413 Other amnesia: Secondary | ICD-10-CM | POA: Diagnosis not present

## 2021-10-10 DIAGNOSIS — R259 Unspecified abnormal involuntary movements: Secondary | ICD-10-CM | POA: Diagnosis not present

## 2021-10-10 DIAGNOSIS — Z8619 Personal history of other infectious and parasitic diseases: Secondary | ICD-10-CM | POA: Diagnosis not present

## 2021-10-10 DIAGNOSIS — Z8673 Personal history of transient ischemic attack (TIA), and cerebral infarction without residual deficits: Secondary | ICD-10-CM | POA: Diagnosis not present

## 2021-10-10 DIAGNOSIS — R296 Repeated falls: Secondary | ICD-10-CM | POA: Diagnosis not present

## 2021-10-10 DIAGNOSIS — R29898 Other symptoms and signs involving the musculoskeletal system: Secondary | ICD-10-CM | POA: Diagnosis not present

## 2021-10-10 DIAGNOSIS — R202 Paresthesia of skin: Secondary | ICD-10-CM | POA: Diagnosis not present

## 2021-10-13 ENCOUNTER — Other Ambulatory Visit: Payer: Self-pay | Admitting: Internal Medicine

## 2021-10-13 DIAGNOSIS — A9232 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: Secondary | ICD-10-CM

## 2021-10-13 DIAGNOSIS — I10 Essential (primary) hypertension: Secondary | ICD-10-CM

## 2021-10-14 NOTE — Telephone Encounter (Signed)
Requested Prescriptions  Pending Prescriptions Disp Refills  . DULoxetine (CYMBALTA) 60 MG capsule [Pharmacy Med Name: DULOXETINE HCL DR 60 MG CAP] 90 capsule 0    Sig: TAKE 1 CAPSULE BY MOUTH EVERY DAY     Psychiatry: Antidepressants - SNRI - duloxetine Failed - 10/13/2021  1:59 AM      Failed - Cr in normal range and within 360 days    Creatinine, Ser  Date Value Ref Range Status  08/06/2020 0.99 0.61 - 1.24 mg/dL Final         Failed - eGFR is 30 or above and within 360 days    GFR calc Af Amer  Date Value Ref Range Status  09/08/2019 91 >59 mL/min/1.73 Final   GFR, Estimated  Date Value Ref Range Status  08/06/2020 >60 >60 mL/min Final    Comment:    (NOTE) Calculated using the CKD-EPI Creatinine Equation (2021)          Passed - Completed PHQ-2 or PHQ-9 in the last 360 days      Passed - Last BP in normal range    BP Readings from Last 1 Encounters:  07/19/21 112/78         Passed - Valid encounter within last 6 months    Recent Outpatient Visits          2 months ago Radiculopathy with lower extremity symptoms   [facility] Glean Hess, MD   6 months ago Upper respiratory tract infection, unspecified type   [facility] Glean Hess, MD   8 months ago Essential hypertension   [place], Laura H, MD   1 year ago Essential hypertension   [facility] Glean Hess, MD   2 years ago Annual physical exam   [facility] Glean Hess, MD      Future Appointments            In 5 days Glean Hess, MD [facility]           . metoprolol succinate (TOPROL-XL) 25 MG 24 hr tablet [Pharmacy Med Name: METOPROLOL SUCC ER 25 MG TAB] 90 tablet 0    Sig: TAKE 1 TABLET (25 MG TOTAL) BY MOUTH AT BEDTIME. TAKE WITH OR IMMEDIATELY FOLLOWING A MEAL.     Cardiovascular:  Beta Blockers Passed - 10/13/2021  1:59 AM      Passed - Last BP in normal range    BP Readings from Last 1  Encounters:  07/19/21 112/78         Passed - Last Heart Rate in normal range    Pulse Readings from Last 1 Encounters:  07/19/21 71         Passed - Valid encounter within last 6 months    Recent Outpatient Visits          2 months ago Radiculopathy with lower extremity symptoms   [facility] Glean Hess, MD   6 months ago Upper respiratory tract infection, unspecified type   [facility] Glean Hess, MD   8 months ago Essential hypertension   [facility] Glean Hess, MD   1 year ago Essential hypertension   [facility] Glean Hess, MD   2 years ago Annual physical exam   [facility] Glean Hess, MD      Future Appointments  In 5 days Glean Hess, MD [place]

## 2021-10-19 ENCOUNTER — Ambulatory Visit (INDEPENDENT_AMBULATORY_CARE_PROVIDER_SITE_OTHER): Payer: Medicare Other | Admitting: Internal Medicine

## 2021-10-19 ENCOUNTER — Encounter: Payer: Self-pay | Admitting: Internal Medicine

## 2021-10-19 VITALS — BP 116/70 | HR 86 | Temp 98.7°F | Ht 71.0 in | Wt 227.0 lb

## 2021-10-19 DIAGNOSIS — A9232 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: Secondary | ICD-10-CM

## 2021-10-19 DIAGNOSIS — R0602 Shortness of breath: Secondary | ICD-10-CM

## 2021-10-19 DIAGNOSIS — G4733 Obstructive sleep apnea (adult) (pediatric): Secondary | ICD-10-CM | POA: Diagnosis not present

## 2021-10-19 DIAGNOSIS — E559 Vitamin D deficiency, unspecified: Secondary | ICD-10-CM | POA: Diagnosis not present

## 2021-10-19 DIAGNOSIS — R051 Acute cough: Secondary | ICD-10-CM | POA: Diagnosis not present

## 2021-10-19 DIAGNOSIS — E538 Deficiency of other specified B group vitamins: Secondary | ICD-10-CM | POA: Insufficient documentation

## 2021-10-19 LAB — POC COVID19 BINAXNOW: SARS Coronavirus 2 Ag: NEGATIVE

## 2021-10-19 MED ORDER — CYANOCOBALAMIN 1000 MCG/ML IJ SOLN
1000.0000 ug | INTRAMUSCULAR | 12 refills | Status: AC
Start: 2021-10-19 — End: ?

## 2021-10-19 MED ORDER — PROMETHAZINE-DM 6.25-15 MG/5ML PO SYRP: 5.0000 mL | ORAL_SOLUTION | Freq: Four times a day (QID) | ORAL | 0 refills | Status: AC | PRN

## 2021-10-19 MED ORDER — AZITHROMYCIN 250 MG PO TABS: ORAL_TABLET | ORAL | 0 refills | Status: AC

## 2021-10-19 NOTE — Progress Notes (Signed)
Date:  10/19/2021   Name:  Jay Ford   DOB:  [birth year]   MRN:  [id number]   Chief Complaint: Sleep Apnea (Was referred to Neuro and had CPAP studies. Had Vit D and B12 defenciencies. Dr Manuella Ghazi recommends he gets back on CPAP. Last study was in [place] years ago.), Cough (Started yesterday- Cough with yellow/green/bloody mucous. Slight fever. Body aches, fatigue. ), and Fall (Having 4 or more falls a week.)  Cough This is a new problem. The current episode started yesterday. The cough is Productive of sputum. Associated symptoms include chills, a fever and shortness of breath (for 5-6 weeks). Pertinent negatives include no chest pain, headaches, myalgias or wheezing.  Shortness of Breath This is a new problem. The current episode started more than 1 month ago. The problem occurs daily. The problem has been unchanged. Associated symptoms include a fever. Pertinent negatives include no chest pain, headaches, orthopnea, PND or wheezing. The symptoms are aggravated by exercise. He has tried nothing for the symptoms.   Lab Results  Component Value Date   NA 137 08/06/2020   K 4.5 08/06/2020   CO2 22 08/06/2020   GLUCOSE 104 (H) 08/06/2020   BUN 15 08/06/2020   CREATININE 0.99 08/06/2020   CALCIUM 8.9 08/06/2020   GFRNONAA >60 08/06/2020   Lab Results  Component Value Date   CHOL 198 08/06/2020   HDL 48 08/06/2020   LDLCALC 121 (H) 08/06/2020   TRIG 146 08/06/2020   CHOLHDL 4.1 08/06/2020   Lab Results  Component Value Date   TSH 0.954 01/19/2016   No results found for: HGBA1C Lab Results  Component Value Date   WBC 5.2 08/06/2020   HGB 14.5 08/06/2020   HCT 44.2 08/06/2020   MCV 91.5 08/06/2020   PLT 192 08/06/2020   Lab Results  Component Value Date   ALT 22 08/06/2020   AST 22 08/06/2020   ALKPHOS 68 08/06/2020   BILITOT 0.7 08/06/2020   No results found for: 25OHVITD2, 25OHVITD3, VD25OH   Review of Systems  Constitutional:  Positive for chills and fever.   HENT:  Negative for trouble swallowing.   Respiratory:  Positive for cough (new) and shortness of breath (for 5-6 weeks). Negative for chest tightness and wheezing.   Cardiovascular:  Negative for chest pain, palpitations, orthopnea and PND.  Musculoskeletal:  Negative for arthralgias and myalgias.  Neurological:  Positive for weakness (leg weakness on left and falls). Negative for dizziness, light-headedness and headaches.  Psychiatric/Behavioral:  Positive for sleep disturbance (snoring, hx of apnea). Negative for dysphoric mood.    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   Obesity, Class I, BMI 30-34.9 06/30/2015   Plantar fasciitis 10/21/2014   Essential hypertension 02/13/2014   Meralgia paresthetica of both lower extremities 02/13/2014   Palpitations 10/09/2011   Hyperlipidemia    MI (mitral incompetence) 02/07/2011   Obstructive sleep apnea syndrome 02/07/2011   Hx of colonic polyp 02/07/2011   Testosterone deficiency 02/07/2011   Tubular adenoma 02/07/2011    Allergies  Allergen Reactions   Gabapentin Other (See Comments)    Is not right, made see things   Topiramate Rash  Blistered rash on face    Past Surgical History:  Procedure Laterality Date   COLONOSCOPY WITH PROPOFOL N/A 10/10/2019   Procedure: COLONOSCOPY WITH BIOPSY;  Surgeon: Lucilla Lame, MD;  Location: [facility];  Service: Endoscopy;  Laterality: N/A;  priority 3   HAND SURGERY Bilateral 2013   carpal tunnel both hands   POLYPECTOMY N/A 10/10/2019   Procedure: POLYPECTOMY;  Surgeon: Lucilla Lame, MD;  Location: [place];  Service: Endoscopy;  Laterality:  N/A;   ROTATOR CUFF REPAIR Bilateral 2013    Social History   Tobacco Use   Smoking status: Never   Smokeless tobacco: Never  Vaping Use   Vaping Use: Never used  Substance Use Topics   Alcohol use: No   Drug use: No     Medication list has been reviewed and updated.  Current Meds  Medication Sig   CANNABIDIOL PO Take by mouth. Using daily for pain   cyanocobalamin (,VITAMIN B-12,) 1000 MCG/ML injection Inject 1,000 mcg into the muscle once.   DULoxetine (CYMBALTA) 30 MG capsule TAKE 1 CAPSULE (30 MG TOTAL) BY MOUTH DAILY. WITH CYMBALTA 60 MG   DULoxetine (CYMBALTA) 60 MG capsule TAKE 1 CAPSULE BY MOUTH EVERY DAY   losartan (COZAAR) 50 MG tablet Take 1 tablet (50 mg total) by mouth daily.   metoprolol succinate (TOPROL-XL) 25 MG 24 hr tablet TAKE 1 TABLET (25 MG TOTAL) BY MOUTH AT BEDTIME. TAKE WITH OR IMMEDIATELY FOLLOWING A MEAL.   Multiple Vitamin (MULTIVITAMIN) LIQD Take 5 mLs by mouth daily.   Vitamin D, Ergocalciferol, (DRISDOL) 1.25 MG (50000 UNIT) CAPS capsule Take 50,000 Units by mouth every 7 (seven) days.       07/19/2021   11:11 AM 04/12/2021    3:36 PM 02/01/2021    9:05 AM 07/28/2020    8:06 AM  GAD 7 : Generalized Anxiety Score  Nervous, Anxious, on Edge 0 0 0 0  Control/stop worrying 0 0 0 0  Worry too much - different things 0 0 0 0  Trouble relaxing 0 0 0 0  Restless 0 0 0 0  Easily annoyed or irritable 0 0 0 0  Afraid - awful might happen 0 0 0 0  Total GAD 7 Score 0 0 0 0  Anxiety Difficulty Not difficult at all Not difficult at all         07/19/2021   11:11 AM  Depression screen PHQ 2/9  Decreased Interest 1  Down, Depressed, Hopeless 0  PHQ - 2 Score 1  Altered sleeping 0  Tired, decreased energy 1  Change in appetite 0  Feeling bad or failure about yourself  0  Trouble concentrating 0  Moving slowly or fidgety/restless 0  Suicidal thoughts 0  PHQ-9 Score 2  Difficult doing work/chores Not difficult at all    BP Readings from Last 3  Encounters:  10/19/21 116/70  07/19/21 112/78  04/12/21 124/78    Physical Exam Vitals and nursing note reviewed.  Constitutional:      General: He is not in acute distress.    Appearance: Normal appearance. He is well-developed.  HENT:     Head: Normocephalic and atraumatic.     Nose:     Right Sinus: Maxillary sinus tenderness and frontal sinus tenderness present.     Left Sinus: Maxillary sinus tenderness and frontal sinus tenderness present.  Cardiovascular:     Rate and Rhythm: Normal rate and regular rhythm.     Pulses: Normal pulses.  Heart sounds: Normal heart sounds.  Pulmonary:     Effort: Pulmonary effort is normal. No respiratory distress.     Breath sounds: No wheezing or rhonchi.  Musculoskeletal:     Cervical back: Normal range of motion.     Right lower leg: No edema.     Left lower leg: No edema.  Lymphadenopathy:     Cervical: No cervical adenopathy.  Skin:    General: Skin is warm and dry.     Findings: No rash.  Neurological:     Mental Status: He is alert and oriented to person, place, and time.  Psychiatric:        Mood and Affect: Mood normal.        Behavior: Behavior normal.    Wt Readings from Last 3 Encounters:  10/19/21 227 lb (103 kg)  07/19/21 233 lb 12.8 oz (106.1 kg)  04/12/21 222 lb (100.7 kg)    BP 116/70   Pulse 86   Temp 98.7 F (37.1 C) (Oral)   Ht '5\' 11"'$  (1.803 m)   Wt 227 lb (103 kg)   SpO2 95%   BMI 31.66 kg/m   Assessment and Plan: 1. Acute cough Covid negative. Will prescribed antibiotics and cough suppressants - POC COVID-19 BinaxNow - azithromycin (ZITHROMAX Z-PAK) 250 MG tablet; UAD  Dispense: 6 each; Refill: 0 - promethazine-dextromethorphan (PROMETHAZINE-DM) 6.25-15 MG/5ML syrup; Take 5 mLs by mouth 4 (four) times daily as needed for up to 9 days for cough.  Dispense: 118 mL; Refill: 0  2. Shortness of breath With exertion without chest pain, PND or Orthopnea EKG normal  Refer to Cardiology due to risk  factors for CAD  - EKG 12-Lead - NSR @ 77; WNL, occ VPCs - Ambulatory referral to Cardiology  3. OSA (obstructive sleep apnea) Has used CPAP in the past with no perceived benefit. Neurology suggested that he be tested and treated again Will send order to [facility] for home study  4. B12 deficiency S/p 4 weekly injections Begin monthly injections indefinitely - Vitamin B12  5. Vitamin D deficiency Finish high dose weekly supplement then start daily. - VITAMIN D 25 Hydroxy (Vit-D Deficiency, Fractures)  6. 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 He has been referred to [facility]rology   Partially dictated using [employer]. Any errors are unintentional.  Halina Maidens, MD [facility]  10/19/2021

## 2021-10-19 NOTE — Patient Instructions (Signed)
When the high dose vitamin D is finished, start daily vitamin D 1000 IU daily.  Continue B12 injections once a month.

## 2021-10-20 LAB — VITAMIN B12: Vitamin B-12: 492 pg/mL (ref 232–1245)

## 2021-10-20 LAB — VITAMIN D 25 HYDROXY (VIT D DEFICIENCY, FRACTURES): Vit D, 25-Hydroxy: 36.8 ng/mL (ref 30.0–100.0)

## 2021-10-31 ENCOUNTER — Encounter: Payer: Self-pay | Admitting: Internal Medicine

## 2021-10-31 ENCOUNTER — Ambulatory Visit: Payer: Self-pay | Admitting: *Deleted

## 2021-10-31 ENCOUNTER — Ambulatory Visit (INDEPENDENT_AMBULATORY_CARE_PROVIDER_SITE_OTHER): Payer: Medicare Other | Admitting: Internal Medicine

## 2021-10-31 VITALS — BP 104/60 | HR 74 | Temp 98.2°F | Ht 71.0 in | Wt 232.0 lb

## 2021-10-31 DIAGNOSIS — J40 Bronchitis, not specified as acute or chronic: Secondary | ICD-10-CM | POA: Diagnosis not present

## 2021-10-31 MED ORDER — AMOXICILLIN-POT CLAVULANATE 875-125 MG PO TABS: 1.0000 | ORAL_TABLET | Freq: Two times a day (BID) | ORAL | 0 refills | Status: DC

## 2021-10-31 MED ORDER — HYDROCODONE BIT-HOMATROP MBR 5-1.5 MG/5ML PO SOLN: 5.0000 mL | Freq: Four times a day (QID) | ORAL | 0 refills | Status: DC | PRN

## 2021-10-31 MED ORDER — PREDNISONE 10 MG PO TABS: 10.0000 mg | ORAL_TABLET | ORAL | 0 refills | Status: DC

## 2021-10-31 NOTE — Progress Notes (Signed)
Date:  10/31/2021   Name:  Jay Ford   DOB:  April 29, 1961   MRN:  [id number]   Chief Complaint: Cough  Cough This is a recurrent problem. The current episode started 1 to 4 weeks ago. The cough is Productive of sputum. Associated symptoms include headaches, nasal congestion, postnasal drip, shortness of breath and wheezing. Pertinent negatives include no chest pain, chills, fever, myalgias or sore throat.    Lab Results  Component Value Date   NA 137 08/06/2020   K 4.5 08/06/2020   CO2 22 08/06/2020   GLUCOSE 104 (H) 08/06/2020   BUN 15 08/06/2020   CREATININE 0.99 08/06/2020   CALCIUM 8.9 08/06/2020   GFRNONAA >60 08/06/2020   Lab Results  Component Value Date   CHOL 198 08/06/2020   HDL 48 08/06/2020   LDLCALC 121 (H) 08/06/2020   TRIG 146 08/06/2020   CHOLHDL 4.1 08/06/2020   Lab Results  Component Value Date   TSH 0.954 01/19/2016   No results found for: "HGBA1C" Lab Results  Component Value Date   WBC 5.2 08/06/2020   HGB 14.5 08/06/2020   HCT 44.2 08/06/2020   MCV 91.5 08/06/2020   PLT 192 08/06/2020   Lab Results  Component Value Date   ALT 22 08/06/2020   AST 22 08/06/2020   ALKPHOS 68 08/06/2020   BILITOT 0.7 08/06/2020   Lab Results  Component Value Date   VD25OH 36.8 10/19/2021     Review of Systems  Constitutional:  Positive for fatigue. Negative for chills and fever.  HENT:  Positive for postnasal drip. Negative for sore throat and trouble swallowing.   Respiratory:  Positive for cough, shortness of breath and wheezing. Negative for chest tightness.   Cardiovascular:  Negative for chest pain and palpitations.  Gastrointestinal:  Negative for diarrhea, nausea and vomiting.  Musculoskeletal:  Negative for myalgias.  Neurological:  Positive for light-headedness (with coughing) and headaches. Negative for dizziness.  Psychiatric/Behavioral:  Positive for sleep disturbance. Negative for dysphoric mood. The patient is not nervous/anxious.      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   Obesity, Class I, BMI 30-34.9 06/30/2015   Plantar fasciitis 10/21/2014   Essential hypertension 02/13/2014   Meralgia paresthetica of both lower extremities 02/13/2014   Palpitations 10/09/2011   Hyperlipidemia    MI (mitral incompetence) 02/07/2011   OSA (obstructive sleep apnea) 02/07/2011   Hx of colonic polyp 02/07/2011   Testosterone deficiency 02/07/2011   Tubular adenoma 02/07/2011    Allergies  Allergen Reactions   Gabapentin Other (See Comments)    Is not right, made see things   Topiramate Rash    Blistered rash on face    Past Surgical History:  Procedure Laterality Date   COLONOSCOPY WITH PROPOFOL N/A 10/10/2019   Procedure: COLONOSCOPY WITH BIOPSY;  Surgeon: Lucilla Lame, MD;  Location: [facility];  Service: Endoscopy;  Laterality: N/A;  priority 3   HAND SURGERY Bilateral 2013   carpal tunnel both hands   POLYPECTOMY N/A 10/10/2019   Procedure: POLYPECTOMY;  Surgeon: Lucilla Lame, MD;  Location: [facility];  Service: Endoscopy;  Laterality: N/A;   ROTATOR CUFF  REPAIR Bilateral 2013    Social History   Tobacco Use   Smoking status: Never   Smokeless tobacco: Never  Vaping Use   Vaping Use: Never used  Substance Use Topics   Alcohol use: No   Drug use: No     Medication list has been reviewed and updated.  Current Meds  Medication Sig   CANNABIDIOL PO Take by mouth. Using daily for pain   cyanocobalamin (,VITAMIN B-12,) 1000 MCG/ML injection Inject 1 mL (1,000 mcg total) into the muscle every 30 (thirty) days.   DULoxetine (CYMBALTA) 30 MG capsule TAKE 1  CAPSULE (30 MG TOTAL) BY MOUTH DAILY. WITH CYMBALTA 60 MG   DULoxetine (CYMBALTA) 60 MG capsule TAKE 1 CAPSULE BY MOUTH EVERY DAY   losartan (COZAAR) 50 MG tablet Take 1 tablet (50 mg total) by mouth daily.   metoprolol succinate (TOPROL-XL) 25 MG 24 hr tablet TAKE 1 TABLET (25 MG TOTAL) BY MOUTH AT BEDTIME. TAKE WITH OR IMMEDIATELY FOLLOWING A MEAL.   Multiple Vitamin (MULTIVITAMIN) LIQD Take 5 mLs by mouth daily.   NON FORMULARY at bedtime. CPAP nightly   Vitamin D, Ergocalciferol, (DRISDOL) 1.25 MG (50000 UNIT) CAPS capsule Take 50,000 Units by mouth every 7 (seven) days.       07/19/2021   11:11 AM 04/12/2021    3:36 PM 02/01/2021    9:05 AM 07/28/2020    8:06 AM  GAD 7 : Generalized Anxiety Score  Nervous, Anxious, on Edge 0 0 0 0  Control/stop worrying 0 0 0 0  Worry too much - different things 0 0 0 0  Trouble relaxing 0 0 0 0  Restless 0 0 0 0  Easily annoyed or irritable 0 0 0 0  Afraid - awful might happen 0 0 0 0  Total GAD 7 Score 0 0 0 0  Anxiety Difficulty Not difficult at all Not difficult at all         07/19/2021   11:11 AM  Depression screen PHQ 2/9  Decreased Interest 1  Down, Depressed, Hopeless 0  PHQ - 2 Score 1  Altered sleeping 0  Tired, decreased energy 1  Change in appetite 0  Feeling bad or failure about yourself  0  Trouble concentrating 0  Moving slowly or fidgety/restless 0  Suicidal thoughts 0  PHQ-9 Score 2  Difficult doing work/chores Not difficult at all    BP Readings from Last 3 Encounters:  10/31/21 104/60  10/19/21 116/70  07/19/21 112/78    Physical Exam Vitals and nursing note reviewed.  Constitutional:      General: He is not in acute distress.    Appearance: Normal appearance. He is well-developed.  HENT:     Head: Normocephalic and atraumatic.     Right Ear: Tympanic membrane and ear canal normal.     Left Ear: Tympanic membrane and ear canal normal.     Nose:     Right Sinus: No maxillary sinus tenderness or frontal  sinus tenderness.     Left Sinus: No maxillary sinus tenderness or frontal sinus tenderness.     Mouth/Throat:     Pharynx: No posterior oropharyngeal erythema.  Cardiovascular:     Rate and Rhythm: Normal rate and regular rhythm.     Pulses: Normal pulses.  Pulmonary:     Effort: Pulmonary effort is normal. No respiratory distress.     Breath sounds: No wheezing or rhonchi.  Musculoskeletal:     Cervical back: Normal range of motion.  Lymphadenopathy:     Cervical: No cervical adenopathy.  Skin:    General: Skin is warm and dry.     Findings: No rash.  Neurological:     Mental Status: He is alert and oriented to person, place, and time.  Psychiatric:        Mood and Affect: Mood normal.        Behavior: Behavior normal.     Wt Readings from Last 3 Encounters:  10/31/21 232 lb (105.2 kg)  10/19/21 227 lb (103 kg)  07/19/21 233 lb 12.8 oz (106.1 kg)    BP 104/60   Pulse 74   Temp 98.2 F (36.8 C) (Oral)   Ht '5\' 11"'$  (1.803 m)   Wt 232 lb (105.2 kg)   SpO2 95%   BMI 32.36 kg/m   Assessment and Plan: 1. Bronchitis Failed Zpak. Will give Augmentin, stronger cough syrup and steroid taper - amoxicillin-clavulanate (AUGMENTIN) 875-125 MG tablet; Take 1 tablet by mouth 2 (two) times daily for 10 days.  Dispense: 20 tablet; Refill: 0 - HYDROcodone bit-homatropine (HYCODAN) 5-1.5 MG/5ML syrup; Take 5 mLs by mouth every 6 (six) hours as needed for up to 10 days for cough.  Dispense: 120 mL; Refill: 0 - predniSONE (DELTASONE) 10 MG tablet; Take 1 tablet (10 mg total) by mouth as directed for 6 days. Take 6,5,4,3,2,1 then stop  Dispense: 21 tablet; Refill: 0   Partially dictated using [employer]. Any errors are unintentional.  Halina Maidens, MD [facility]  10/31/2021

## 2021-10-31 NOTE — Telephone Encounter (Signed)
Summary: cough,congestion/requesting medication   Pt wife stated pt is not feeling any better cough,congestion. Pt wife is requesting a stronger antibiotic for pt . Pt was last seen on 10/19/21.   Pt wife declined appointment seeking clinical advice.     Please call pt at -  [phone]     Medication: Azithromycin, promethazine 5/31-acute cough, SOB, West nile infection Attempted to call patient on number provider- no answer ans mailbox is full.

## 2021-10-31 NOTE — Telephone Encounter (Signed)
  Chief Complaint: cough- not better with recent antibiotic treatment Symptoms: cough- chest congestion Frequency: 2 weeks Pertinent Negatives: Patient denies fever Disposition: '[]'$ ED /'[]'$ Urgent Care (no appt availability in office) / '[]'$ Appointment(In office/virtual)/ '[]'$  [facility]/ '[]'$ Home Care/ '[]'$ Refused Recommended Disposition /'[]'$ [place] [facility]/ '[]'$  Follow-up with PCP Additional Notes: Called office to make appointment- but office is closed for lunch- message sent to [facility]- first available appointment 6/19 with provider. Patient states he is available whenever he can be seen  Reason for Disposition  [1] Continuous (nonstop) coughing interferes with work or school AND [2] no improvement using cough treatment per Care Advice  Answer Assessment - Initial Assessment Questions 1. ONSET: "When did the cough begin?"      2 weeks 2. SEVERITY: "How bad is the cough today?"      All the time 3. SPUTUM: "Describe the color of your sputum" (none, dry cough; clear, white, yellow, green)     yellow 4. HEMOPTYSIS: "Are you coughing up any blood?" If so ask: "How much?" (flecks, streaks, tablespoons, etc.)     no 5. DIFFICULTY BREATHING: "Are you having difficulty breathing?" If Yes, ask: "How bad is it?" (e.g., mild, moderate, severe)    - MILD: No SOB at rest, mild SOB with walking, speaks normally in sentences, can lie down, no retractions, pulse < 100.    - MODERATE: SOB at rest, SOB with minimal exertion and prefers to sit, cannot lie down flat, speaks in phrases, mild retractions, audible wheezing, pulse 100-120.    - SEVERE: Very SOB at rest, speaks in single words, struggling to breathe, sitting hunched forward, retractions, pulse > 120      Yes- during cough 6. FEVER: "Do you have a fever?" If Yes, ask: "What is your temperature, how was it measured, and when did it start?"     no 7. CARDIAC HISTORY: "Do you have any history of heart disease?" (e.g., heart attack, congestive heart  failure)      *No Answer* 8. LUNG HISTORY: "Do you have any history of lung disease?"  (e.g., pulmonary embolus, asthma, emphysema)     *No Answer* 9. PE RISK FACTORS: "Do you have a history of blood clots?" (or: recent major surgery, recent prolonged travel, bedridden)     *No Answer* 10. OTHER SYMPTOMS: "Do you have any other symptoms?" (e.g., runny nose, wheezing, chest pain)       no 11. PREGNANCY: "Is there any chance you are pregnant?" "When was your last menstrual period?"       *No Answer* 12. TRAVEL: "Have you traveled out of the country in the last month?" (e.g., travel history, exposures)       *No Answer*  Protocols used: Cough - Acute Productive-A-AH

## 2021-10-31 NOTE — Telephone Encounter (Signed)
Second attempt to reach out to patient- no answer- and mailbox is full. Patient's spouse- Sharyn Lull is in triage also and messages have been left for her too.

## 2021-11-01 ENCOUNTER — Other Ambulatory Visit: Payer: Self-pay | Admitting: Internal Medicine

## 2021-11-01 ENCOUNTER — Other Ambulatory Visit: Payer: Self-pay

## 2021-11-01 DIAGNOSIS — J40 Bronchitis, not specified as acute or chronic: Secondary | ICD-10-CM

## 2021-11-01 MED ORDER — AMOXICILLIN-POT CLAVULANATE 875-125 MG PO TABS: 1.0000 | ORAL_TABLET | Freq: Two times a day (BID) | ORAL | 0 refills | Status: AC

## 2021-11-01 MED ORDER — HYDROCODONE BIT-HOMATROP MBR 5-1.5 MG/5ML PO SOLN: 5.0000 mL | Freq: Four times a day (QID) | ORAL | 0 refills | Status: AC | PRN

## 2021-11-01 MED ORDER — PREDNISONE 10 MG PO TABS: 10.0000 mg | ORAL_TABLET | ORAL | 0 refills | Status: AC

## 2021-12-01 ENCOUNTER — Encounter: Payer: Self-pay | Admitting: Cardiology

## 2021-12-01 ENCOUNTER — Ambulatory Visit: Payer: Medicare Other | Admitting: Cardiology

## 2022-01-04 ENCOUNTER — Ambulatory Visit: Payer: Medicare Other | Admitting: Internal Medicine

## 2022-01-09 ENCOUNTER — Other Ambulatory Visit: Payer: Self-pay | Admitting: Internal Medicine

## 2022-01-09 DIAGNOSIS — I10 Essential (primary) hypertension: Secondary | ICD-10-CM

## 2022-01-10 NOTE — Telephone Encounter (Signed)
Requested Prescriptions  Pending Prescriptions Disp Refills  . metoprolol succinate (TOPROL-XL) 25 MG 24 hr tablet [Pharmacy Med Name: METOPROLOL SUCC ER 25 MG TAB] 90 tablet 0    Sig: TAKE 1 TABLET (25 MG TOTAL) BY MOUTH AT BEDTIME. TAKE WITH OR IMMEDIATELY FOLLOWING A MEAL.     Cardiovascular:  Beta Blockers Passed - 01/09/2022 12:32 PM      Passed - Last BP in normal range    BP Readings from Last 1 Encounters:  10/31/21 104/60         Passed - Last Heart Rate in normal range    Pulse Readings from Last 1 Encounters:  10/31/21 74         Passed - Valid encounter within last 6 months    Recent Outpatient Visits          2 months ago [place] [facility] at [facility], Jesse Sans, MD   2 months ago Acute cough   [place] [facility] at [facility], Jesse Sans, MD   5 months ago Radiculopathy with lower extremity symptoms   [place] [facility] at [facility], Jesse Sans, MD   9 months ago Upper respiratory tract infection, unspecified type   [facility] at [facility], Jesse Sans, MD   11 months ago Essential hypertension   [place] [facility] at [facility], Jesse Sans, MD

## 2022-01-13 ENCOUNTER — Other Ambulatory Visit: Payer: Self-pay | Admitting: Internal Medicine

## 2022-01-13 DIAGNOSIS — A9232 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: Secondary | ICD-10-CM

## 2022-01-13 NOTE — Telephone Encounter (Signed)
Requested Prescriptions  Pending Prescriptions Disp Refills  . DULoxetine (CYMBALTA) 60 MG capsule [Pharmacy Med Name: DULOXETINE HCL DR 60 MG CAP] 90 capsule 0    Sig: TAKE 1 CAPSULE BY MOUTH EVERY DAY     Psychiatry: Antidepressants - SNRI - duloxetine Failed - 01/13/2022  2:18 AM      Failed - Cr in normal range and within 360 days    Creatinine, Ser  Date Value Ref Range Status  08/06/2020 0.99 0.61 - 1.24 mg/dL Final         Failed - eGFR is 30 or above and within 360 days    GFR calc Af Amer  Date Value Ref Range Status  09/08/2019 91 >59 mL/min/1.73 Final   GFR, Estimated  Date Value Ref Range Status  08/06/2020 >60 >60 mL/min Final    Comment:    (NOTE) Calculated using the CKD-EPI Creatinine Equation (2021)          Passed - Completed PHQ-2 or PHQ-9 in the last 360 days      Passed - Last BP in normal range    BP Readings from Last 1 Encounters:  10/31/21 104/60         Passed - Valid encounter within last 6 months    Recent Outpatient Visits          2 months ago Bronchitis   [place] [facility] at [facility], Jesse Sans, MD   2 months ago Acute cough   [place] [facility] at [facility], Jesse Sans, MD   5 months ago Radiculopathy with lower extremity symptoms   [place] [facility] at [facility], Jesse Sans, MD   9 months ago Upper respiratory tract infection, unspecified type   [facility] at [facility], Jesse Sans, MD   11 months ago Essential hypertension    Primary [facility] and Sports Medicine at [facility], Jesse Sans, MD

## 2022-01-30 ENCOUNTER — Ambulatory Visit: Payer: Medicare Other

## 2022-02-03 ENCOUNTER — Ambulatory Visit (INDEPENDENT_AMBULATORY_CARE_PROVIDER_SITE_OTHER): Payer: Medicare Other

## 2022-02-03 DIAGNOSIS — Z0000 Encounter for general adult medical examination without abnormal findings: Secondary | ICD-10-CM | POA: Diagnosis not present

## 2022-02-03 NOTE — Patient Instructions (Signed)
Health Maintenance, Male Adopting a healthy lifestyle and getting preventive care are important in promoting health and wellness. Ask your health care provider about: The right schedule for you to have regular tests and exams. Things you can do on your own to prevent diseases and keep yourself healthy. What should I know about diet, weight, and exercise? Eat a healthy diet  Eat a diet that includes plenty of vegetables, fruits, low-fat dairy products, and lean protein. Do not eat a lot of foods that are high in solid fats, added sugars, or sodium. Maintain a healthy weight Body mass index (BMI) is a measurement that can be used to identify possible weight problems. It estimates body fat based on height and weight. Your health care provider can help determine your BMI and help you achieve or maintain a healthy weight. Get regular exercise Get regular exercise. This is one of the most important things you can do for your health. Most adults should: Exercise for at least 150 minutes each week. The exercise should increase your heart rate and make you sweat (moderate-intensity exercise). Do strengthening exercises at least twice a week. This is in addition to the moderate-intensity exercise. Spend less time sitting. Even light physical activity can be beneficial. Watch cholesterol and blood lipids Have your blood tested for lipids and cholesterol at 60 years of age, then have this test every 5 years. You may need to have your cholesterol levels checked more often if: Your lipid or cholesterol levels are high. You are older than 60 years of age. You are at high risk for heart disease. What should I know about cancer screening? Many types of cancers can be detected early and may often be prevented. Depending on your health history and family history, you may need to have cancer screening at various ages. This may include screening for: Colorectal cancer. Prostate cancer. Skin cancer. Lung  cancer. What should I know about heart disease, diabetes, and high blood pressure? Blood pressure and heart disease High blood pressure causes heart disease and increases the risk of stroke. This is more likely to develop in people who have high blood pressure readings or are overweight. Talk with your health care provider about your target blood pressure readings. Have your blood pressure checked: Every 3-5 years if you are 18-39 years of age. Every year if you are 40 years old or older. If you are between the ages of 65 and 75 and are a current or former smoker, ask your health care provider if you should have a one-time screening for abdominal aortic aneurysm (AAA). Diabetes Have regular diabetes screenings. This checks your fasting blood sugar level. Have the screening done: Once every three years after age 45 if you are at a normal weight and have a low risk for diabetes. More often and at a younger age if you are overweight or have a high risk for diabetes. What should I know about preventing infection? Hepatitis B If you have a higher risk for hepatitis B, you should be screened for this virus. Talk with your health care provider to find out if you are at risk for hepatitis B infection. Hepatitis C Blood testing is recommended for: Everyone born from 1945 through 1965. Anyone with known risk factors for hepatitis C. Sexually transmitted infections (STIs) You should be screened each year for STIs, including gonorrhea and chlamydia, if: You are sexually active and are younger than 60 years of age. You are older than 60 years of age and your   health care provider tells you that you are at risk for this type of infection. Your sexual activity has changed since you were last screened, and you are at increased risk for chlamydia or gonorrhea. Ask your health care provider if you are at risk. Ask your health care provider about whether you are at high risk for HIV. Your health care provider  may recommend a prescription medicine to help prevent HIV infection. If you choose to take medicine to prevent HIV, you should first get tested for HIV. You should then be tested every 3 months for as long as you are taking the medicine. Follow these instructions at home: Alcohol use Do not drink alcohol if your health care provider tells you not to drink. If you drink alcohol: Limit how much you have to 0-2 drinks a day. Know how much alcohol is in your drink. In the [place] one drink equals one 12 oz bottle of beer (355 mL), one 5 oz glass of wine (148 mL), or one 1 oz glass of hard liquor (44 mL). Lifestyle Do not use any products that contain nicotine or tobacco. These products include cigarettes, chewing tobacco, and vaping devices, such as e-cigarettes. If you need help quitting, ask your health care provider. Do not use street drugs. Do not share needles. Ask your health care provider for help if you need support or information about quitting drugs. General instructions Schedule regular health, dental, and eye exams. Stay current with your vaccines. Tell your health care provider if: You often feel depressed. You have ever been abused or do not feel safe at home. Summary Adopting a healthy lifestyle and getting preventive care are important in promoting health and wellness. Follow your health care provider's instructions about healthy diet, exercising, and getting tested or screened for diseases. Follow your health care provider's instructions on monitoring your cholesterol and blood pressure. This information is not intended to replace advice given to you by your health care provider. Make sure you discuss any questions you have with your health care provider. Document Revised: 09/27/2020 Document Reviewed: 09/27/2020 Elsevier Patient Education  2023 Elsevier Inc.  

## 2022-02-03 NOTE — Progress Notes (Signed)
I connected with  Jay Ford on 02/03/22 by a audio enabled telemedicine application and verified that I am speaking with the correct person using two identifiers.  Patient Location: Home  Provider Location: Office/Clinic  I discussed the limitations of evaluation and management by telemedicine. The patient expressed understanding and agreed to proceed.   Subjective:   Jay Ford is a 61 y.o. male who presents for Medicare Annual/Subsequent preventive examination.  Review of Systems    Per HPI unless specifically indicated below.  Cardiac Risk Factors include: advanced age (>36mn, >>22women);male gender, essential hypertension, and hyperlipidemia.           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     01/26/2021    8:50 AM 01/07/2020    3:08 PM 10/10/2019   10:09 AM 04/10/2018    1:47 PM 03/05/2017    1:45 PM  Advanced Directives  Does Patient Have a Medical Advance Directive? No No No No No  Does patient want to make changes to medical advance directive?   No - Patient declined  Yes (MAU/Ambulatory/Procedural Areas - Information given)  Would patient like information on creating a medical advance directive? Yes (MAU/Ambulatory/Procedural Areas - Information given) No - Patient declined No - Patient declined Yes (MAU/Ambulatory/Procedural Areas - Information given)     Current Medications (verified) Outpatient Encounter Medications as of 02/03/2022  Medication Sig   CANNABIDIOL PO Take by mouth. Using daily for pain   cyanocobalamin (,VITAMIN B-12,) 1000 MCG/ML  injection Inject 1 mL (1,000 mcg total) into the muscle every 30 (thirty) days.   DULoxetine (CYMBALTA) 30 MG capsule TAKE 1 CAPSULE (30 MG TOTAL) BY MOUTH DAILY. WITH CYMBALTA 60 MG   DULoxetine (CYMBALTA) 60 MG capsule TAKE 1 CAPSULE BY MOUTH EVERY DAY   losartan (COZAAR) 50 MG tablet Take 1 tablet (50 mg total) by mouth daily.   metoprolol succinate (TOPROL-XL) 25 MG 24 hr tablet TAKE 1 TABLET (25 MG TOTAL) BY MOUTH AT BEDTIME. TAKE WITH OR IMMEDIATELY FOLLOWING A MEAL.   Multiple Vitamin (MULTIVITAMIN) LIQD Take 5 mLs by mouth daily.   NON FORMULARY at bedtime. CPAP nightly   Vitamin D, Ergocalciferol, (DRISDOL) 1.25 MG (50000 UNIT) CAPS capsule Take 50,000 Units by mouth every 7 (seven) days.   No facility-administered encounter medications on file as of 02/03/2022.    Allergies (verified) Gabapentin and Topiramate   History: Past Medical History:  Diagnosis Date   Hyperlipidemia    Hypertension    controlled on meds   Sleep apnea    CPAP   Tremors of nervous system    Right upper extremity   West Nile Virus infection 2008   pain in knee-thigh in right leg   Past Surgical History:  Procedure Laterality Date   COLONOSCOPY WITH PROPOFOL N/A 10/10/2019   Procedure: COLONOSCOPY WITH BIOPSY;  Surgeon: WLucilla Lame MD;  Location: [facility];  Service: Endoscopy;  Laterality: N/A;  priority 3   HAND SURGERY Bilateral 2013   carpal tunnel both hands   POLYPECTOMY N/A 10/10/2019   Procedure: POLYPECTOMY;  Surgeon: Lucilla Lame, MD;  Location: [place];  Service: Endoscopy;  Laterality: N/A;   ROTATOR CUFF REPAIR Bilateral 2013   Family History  Problem Relation Age of Onset   Cancer Father        prostate cancer   Diabetes Father    Heart attack Father    Cancer Mother        breast cancer   Diabetes Sister    Social History   Socioeconomic History   Marital status: Married    Spouse name: Hanad Leino   Number of children: 2   Years of  education: Not on file   Highest education level: High school graduate  Occupational History   Occupation: parent    Comment: on SS Disability  Tobacco Use   Smoking status: Never   Smokeless tobacco: Never  Vaping Use   Vaping Use: Never used  Substance and Sexual Activity   Alcohol use: No   Drug use: No   Sexual activity: Not on file  Other Topics Concern   Not on file  Social History Narrative   Not on file   Social Determinants of Health   Financial Resource Strain: Low Risk  (02/03/2022)   Overall Financial Resource Strain (CARDIA)    Difficulty of Paying Living Expenses: Not hard at all  Food Insecurity: No Food Insecurity (02/03/2022)   Hunger Vital Sign    Worried About Running Out of Food in the Last Year: Never true    [place] in the Last Year: Never true  Transportation Needs: No Transportation Needs (01/26/2021)   PRAPARE - Hydrologist (Medical): No    Lack of Transportation (Non-Medical): No  Physical Activity: Inactive (02/03/2022)   Exercise Vital Sign    Days of Exercise per Week: 0 days    Minutes of Exercise per Session: 0 min  Stress: No Stress Concern Present (02/03/2022)   [place]    Feeling of Stress : Not at all  Social Connections: [place] (02/03/2022)   Social Connection and Isolation Panel [NHANES]    Frequency of Communication with Friends and Family: Twice a week    Frequency of Social Gatherings with Friends and Family: Once a week    Attends Religious Services: More than 4 times per year    Active Member of [employer] or Organizations: Yes    Attends Music therapist: More than 4 times per year    Marital Status: Married    Tobacco Counseling No tobacco counseling is needed.   Clinical Intake:  Pre-visit preparation completed: Yes  Pain : 0-10 Pain Score: 8  Pain Type: Chronic pain Pain Location: Leg Pain  Orientation: Right Pain Descriptors / Indicators: Aching, Shooting, Throbbing Pain Frequency: Constant Pain Relieving Factors: warm compressions  Pain Relieving Factors: warm compressions  Nutritional Status: BMI > 30  Obese Nutritional Risks: None Diabetes: No     Diabetic?No   Interpreter Needed?: No  Information entered by :: Donnie Mesa, CMA   Activities of Daily Living    02/03/2022    8:20 AM 07/19/2021   11:12 AM  In your present state of health, do you have any difficulty performing the following activities:  Hearing? 0 0  Vision?  0 0  Difficulty concentrating or making decisions? 0 0  Walking or climbing stairs? 1 1  Dressing or bathing? 0 0  Doing errands, shopping? 0 0    Patient Care Team: Glean Hess, MD as PCP - General (Family Medicine) Smithson, Myrna Blazer, MD (Neurology) Angelia Mould, NP as Nurse Practitioner (Orthopedic Surgery)  Indicate any recent Medical Services you may have received from other than [facility] providers in the past year (date may be approximate).    No hospitalization in the past 12 months. Assessment:   This is a routine wellness examination for [place].  Hearing/Vision screen Denies any hearing issues.  Denies any vision issues. Annual [place]. Dietary issues and exercise activities discussed: Current Exercise Habits: The patient does not participate in regular exercise at present, Exercise limited by: None identified   Goals Addressed             This Visit's Progress    Activity and Exercise Increased       Evidence-based guidance:  Review current exercise levels.  Assess patient perspective on exercise or activity level, barriers to increasing activity, motivation and readiness for change.  Recommend or set healthy exercise goal based on individual tolerance.  Encourage small steps toward making change in amount of exercise or activity.  Urge reduction of sedentary activities or  screen time.  Promote group activities within the community or with family or support person.  Consider referral to rehabiliation therapist for assessment and exercise/activity plan.          Depression Screen    02/03/2022    8:19 AM 07/19/2021   11:11 AM 04/12/2021    3:36 PM 02/01/2021    9:04 AM 01/26/2021    8:49 AM 07/28/2020    8:06 AM 01/07/2020    3:07 PM  PHQ 2/9 Scores  PHQ - 2 Score 0 '1 1 2 '$ 0 0 0  PHQ- 9 Score  '2 2 4  '$ 0     Fall Risk    02/03/2022    8:20 AM 07/19/2021   11:12 AM 04/12/2021    3:36 PM 02/01/2021    9:05 AM 01/26/2021    8:51 AM  Fall Risk   Falls in the past year? 0 1 0 0 0  Number falls in past yr: 0 1 0 0 0  Comment  6+     Injury with Fall? 0 0 0 0 0  Risk for fall due to : No Fall Risks History of fall(s);Impaired balance/gait No Fall Risks No Fall Risks No Fall Risks  Follow up Falls evaluation completed Falls evaluation completed Falls evaluation completed Falls evaluation completed Falls prevention discussed    FALL RISK PREVENTION PERTAINING TO THE HOME:  Any stairs in or around the home? No  If so, are there any without handrails? No  Home free of loose throw rugs in walkways, pet beds, electrical cords, etc? Yes  Adequate lighting in your home to reduce risk of falls? Yes   ASSISTIVE DEVICES UTILIZED TO PREVENT FALLS:  Life alert? No  Use of a cane, walker or w/c? Yes  Grab bars in the bathroom? Yes  Shower chair or bench in shower? Yes  Elevated toilet seat or a handicapped toilet? No   TIMED UP AND GO:  Was the test performed?  unable to perform, virtual appointment  .    Cognitive Function:        02/03/2022    8:20 AM  6CIT Screen  What Year? 0 points  What month? 0 points  What time? 0 points  Count back from 20 0 points  Months in reverse 0 points  Repeat phrase 8 points  Total Score 8 points    Immunizations Immunization History  Administered Date(s) Administered   Influenza,inj,Quad PF,6+ Mos 03/20/2016,  03/05/2017, 04/10/2018, 02/01/2021   Influenza-Unspecified 02/20/2015   Tdap 06/30/2015    TDAP status: Up to date  Flu Vaccine status: Due, Education has been provided regarding the importance of this vaccine. Advised may receive this vaccine at local pharmacy or Health Dept. Aware to provide a copy of the vaccination record if obtained from local pharmacy or Health Dept. Verbalized acceptance and understanding.  Pneumococcal vaccine status: Up to date  Covid-19 vaccine status: Declined, Education has been provided regarding the importance of this vaccine but patient still declined. Advised may receive this vaccine at local pharmacy or Health Dept.or vaccine clinic. Aware to provide a copy of the vaccination record if obtained from local pharmacy or Health Dept. Verbalized acceptance and understanding.  Qualifies for Shingles Vaccine? Yes   Zostavax completed No   Shingrix Completed?: No.    Education has been provided regarding the importance of this vaccine. Patient has been advised to call insurance company to determine out of pocket expense if they have not yet received this vaccine. Advised may also receive vaccine at local pharmacy or Health Dept. Verbalized acceptance and understanding.  Screening Tests Health Maintenance  Topic Date Due   COVID-19 Vaccine (1) Never done   HIV Screening  Never done   Zoster Vaccines- Shingrix (1 of 2) Never done   INFLUENZA VACCINE  12/20/2021   COLONOSCOPY (Pts 45-57yr Insurance coverage will need to be confirmed)  10/09/2024   TETANUS/TDAP  06/29/2025   Hepatitis C Screening  Completed   Pneumococcal Vaccine [id number] old  Aged Out   HPV VACCINES  Aged Out   HEMOGLOBIN A1C  Discontinued    Health Maintenance  Health Maintenance Due  Topic Date Due   COVID-19 Vaccine (1) Never done   HIV Screening  Never done   Zoster Vaccines- Shingrix (1 of 2) Never done   INFLUENZA VACCINE  12/20/2021    Colorectal cancer screening: Type of  screening: Colonoscopy. Completed 10/10/2019. Repeat every 5 years  Lung Cancer Screening: (Low Dose CT Chest recommended if Age 60-80years, 30 pack-year currently smoking OR have quit w/in 15years.) does not qualify.    Additional Screening:  Hepatitis C Screening: does qualify; Completed 05/19/2021  Vision Screening: Recommended annual ophthalmology exams for early detection of glaucoma and other disorders of the eye. Is the patient up to date with their annual eye exam?  Yes  Who is the provider or what is the name of the office in which the patient attends annual [facility] exams? [facility] If pt is not established with a provider, would they like to be referred to a provider to establish care? No .   Dental Screening: Recommended annual dental exams for proper oral hygiene  Community Resource Referral / Chronic Care Management: CRR required this visit?  No   CCM required this visit?  No      Plan:     I have personally reviewed and noted the following in the patient's chart:   Medical and social history Use of alcohol, tobacco or illicit drugs  Current medications and supplements including opioid prescriptions. Patient is not currently taking opioid prescriptions. Functional ability and  status Nutritional status Physical activity Advanced directives List of other physicians Hospitalizations, surgeries, and ER visits in previous 12 months Vitals Screenings to include cognitive, depression, and falls Referrals and appointments  In addition, I have reviewed and discussed with patient certain preventive protocols, quality metrics, and best practice recommendations. A written personalized care plan for preventive services as well as general preventive health recommendations were provided to patient.    Mr. Guardia , Thank you for taking time to come for your Medicare Wellness Visit. I appreciate your ongoing commitment to your health goals. Please review the following  plan we discussed and let me know if I can assist you in the future.   These are the goals we discussed:  Goals      Activity and Exercise Increased     Evidence-based guidance:  Review current exercise levels.  Assess patient perspective on exercise or activity level, barriers to increasing activity, motivation and readiness for change.  Recommend or set healthy exercise goal based on individual tolerance.  Encourage small steps toward making change in amount of exercise or activity.  Urge reduction of sedentary activities or screen time.  Promote group activities within the community or with family or support person.  Consider referral to rehabiliation therapist for assessment and exercise/activity plan.        Reduce portion size     Continue to eat 3 small meals per day and at least 2 snacks per day        This is a list of the screening recommended for you and due dates:  Health Maintenance  Topic Date Due   COVID-19 Vaccine (1) Never done   HIV Screening  Never done   Zoster (Shingles) Vaccine (1 of 2) Never done   Flu Shot  12/20/2021   Colon Cancer Screening  10/09/2024   Tetanus Vaccine  06/29/2025   Hepatitis C Screening: USPSTF Recommendation to screen - Ages 18-79 yo.  Completed   Pneumococcal Vaccination  Aged Out   HPV Vaccine  Aged Out   Hemoglobin A1C  Discontinued     Wilson Singer, [facility]   02/03/2022   Nurse Notes: Approximately 40 minute Non Face to Face visit

## 2022-03-03 ENCOUNTER — Other Ambulatory Visit: Payer: Self-pay | Admitting: Internal Medicine

## 2022-03-03 DIAGNOSIS — I10 Essential (primary) hypertension: Secondary | ICD-10-CM

## 2022-03-03 NOTE — Telephone Encounter (Signed)
Requested Prescriptions  Pending Prescriptions Disp Refills  . losartan (COZAAR) 50 MG tablet [Pharmacy Med Name: LOSARTAN POTASSIUM 50 MG TAB] 90 tablet 0    Sig: TAKE 1 TABLET BY MOUTH EVERY DAY     Cardiovascular:  Angiotensin Receptor Blockers Failed - 03/03/2022 12:54 AM      Failed - Cr in normal range and within 180 days    Creatinine, Ser  Date Value Ref Range Status  08/06/2020 0.99 0.61 - 1.24 mg/dL Final         Failed - K in normal range and within 180 days    Potassium  Date Value Ref Range Status  08/06/2020 4.5 3.5 - 5.1 mmol/L Final         Passed - Patient is not pregnant      Passed - Last BP in normal range    BP Readings from Last 1 Encounters:  10/31/21 104/60         Passed - Valid encounter within last 6 months    Recent Outpatient Visits          4 months ago Bronchitis   [place] [facility] at [facility], Jesse Sans, MD   4 months ago Acute cough   [place] [facility] at [facility], Jesse Sans, MD   7 months ago Radiculopathy with lower extremity symptoms   [place] [facility] at [facility], Jesse Sans, MD   10 months ago Upper respiratory tract infection, unspecified type   [facility] at [facility], Jesse Sans, MD   1 year ago Essential hypertension   [place] and [facility] Medicine at [facility], Jesse Sans, MD

## 2022-03-26 ENCOUNTER — Other Ambulatory Visit: Payer: Self-pay | Admitting: Internal Medicine

## 2022-03-26 DIAGNOSIS — I10 Essential (primary) hypertension: Secondary | ICD-10-CM

## 2022-03-26 DIAGNOSIS — A9232 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: Secondary | ICD-10-CM

## 2022-03-28 ENCOUNTER — Other Ambulatory Visit: Payer: Self-pay | Admitting: Internal Medicine

## 2022-03-28 DIAGNOSIS — A9232 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: Secondary | ICD-10-CM

## 2022-03-28 DIAGNOSIS — I10 Essential (primary) hypertension: Secondary | ICD-10-CM

## 2022-03-28 NOTE — Telephone Encounter (Signed)
Medication Refill - Medication: DULoxetine (CYMBALTA) 60 MG capsule, metoprolol succinate (TOPROL-XL) 25 MG 24 hr tablet   Has the patient contacted their pharmacy? Yes.    Preferred Pharmacy (with phone number or street name):  [id number] [place] [address] Phone: [phone] Fax: [phone]    Has the patient been seen for an appointment in the last year OR does the patient have an upcoming appointment? Yes.    Please assist patient further. Patient has no medication left at this time

## 2022-03-28 NOTE — Telephone Encounter (Signed)
Requested medication (s) are due for refill today: No  Requested medication (s) are on the active medication list: yes    Last refill: Metoprolol 01/10/22  #90  0 refills   Duloxetine  01/13/22  #90  0 refills  Future visit scheduled no  Notes to clinic:Attempted to reach pt.for clarification,  In call pt stated he had no meds left. No answer both attempts.  Requested Prescriptions  Pending Prescriptions Disp Refills   metoprolol succinate (TOPROL-XL) 25 MG 24 hr tablet 90 tablet 0    Sig: Take 1 tablet (25 mg total) by mouth at bedtime. Take with or immediately following a meal.     Cardiovascular:  Beta Blockers Passed - 03/28/2022 11:07 AM      Passed - Last BP in normal range    BP Readings from Last 1 Encounters:  10/31/21 104/60         Passed - Last Heart Rate in normal range    Pulse Readings from Last 1 Encounters:  10/31/21 74         Passed - Valid encounter within last 6 months    Recent Outpatient Visits           4 months ago [place] [facility] at [facility], Jesse Sans, MD   5 months ago Acute cough   [place] [facility] at [facility], Jesse Sans, MD   8 months ago Radiculopathy with lower extremity symptoms   [place] [facility] at [facility], Jesse Sans, MD   11 months ago Upper respiratory tract infection, unspecified type   [facility] Medicine at [facility], Jesse Sans, MD   1 year ago Essential hypertension   [place] and [facility] Medicine at [facility], Jesse Sans, MD               DULoxetine (CYMBALTA) 60 MG capsule      Sig: Take by mouth daily.     Psychiatry: Antidepressants - SNRI - duloxetine Failed - 03/28/2022 11:07 AM      Failed - Cr in normal range and within 360 days    Creatinine, Ser  Date Value Ref Range Status  08/06/2020 0.99 0.61 - 1.24 mg/dL  Final         Failed - eGFR is 30 or above and within 360 days    GFR calc Af Amer  Date Value Ref Range Status  09/08/2019 91 >59 mL/min/1.73 Final   GFR, Estimated  Date Value Ref Range Status  08/06/2020 >60 >60 mL/min Final    Comment:    (NOTE) Calculated using the CKD-EPI Creatinine Equation (2021)          Passed - Completed PHQ-2 or PHQ-9 in the last 360 days      Passed - Last BP in normal range    BP Readings from Last 1 Encounters:  10/31/21 104/60         Passed - Valid encounter within last 6 months    Recent Outpatient Visits           4 months ago Bronchitis   [place] [facility] at [facility], Jesse Sans, MD   5 months ago Acute cough   [place] [facility] at [facility], Jesse Sans, MD   8 months ago Radiculopathy  with lower extremity symptoms   [place] [facility] at [facility], Jesse Sans, MD   11 months ago Upper respiratory tract infection, unspecified type   [facility] and [facility] Medicine at [facility], Jesse Sans, MD   1 year ago Essential hypertension   [place] [facility] at [facility], Jesse Sans, MD

## 2022-04-25 ENCOUNTER — Other Ambulatory Visit: Payer: Self-pay

## 2022-04-25 ENCOUNTER — Telehealth: Payer: Self-pay | Admitting: *Deleted

## 2022-04-25 ENCOUNTER — Other Ambulatory Visit: Payer: Self-pay | Admitting: Internal Medicine

## 2022-04-25 DIAGNOSIS — A9232 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: Secondary | ICD-10-CM

## 2022-04-25 DIAGNOSIS — I10 Essential (primary) hypertension: Secondary | ICD-10-CM

## 2022-04-25 MED ORDER — DULOXETINE HCL 60 MG PO CPEP: ORAL_CAPSULE | ORAL | 0 refills | Status: DC

## 2022-04-25 MED ORDER — METOPROLOL SUCCINATE ER 25 MG PO TB24: 25.0000 mg | ORAL_TABLET | Freq: Every day | ORAL | 0 refills | Status: DC

## 2022-04-25 NOTE — Telephone Encounter (Signed)
Wife calling to follow up on request for his DULoxetine (CYMBALTA) 30 MG capsule  DULoxetine (CYMBALTA) 60 MG capsule  metoprolol succinate (TOPROL-XL) 25 MG 24 hr tablet   Request was sent from pharmacy on 11/3. Pt has started having tremors this am, and transferred to nurse triage.  [id number] [place] [facility] [address]

## 2022-04-25 NOTE — Telephone Encounter (Signed)
Patient spouse is calling to request medication for patient- he has been out for 3 days and is having SE(tremors) from not taking. Reviewed former request- was denied by the office. Advised patient needs follow up appointment and patient has been scheduled for 05/10/22. Patient is requesting courtesy refill: Cymbalta and Toprol. Patient would like to pick it up today by lunch time- advised I would send request for review. Request added by agent to past request

## 2022-04-25 NOTE — Telephone Encounter (Signed)
Unable to refill per protocol, last refill by provider 04/25/22 for 30 days. Will refuse.  Requested Prescriptions  Pending Prescriptions Disp Refills   DULoxetine (CYMBALTA) 60 MG capsule [Pharmacy Med Name: DULOXETINE HCL DR 60 MG CAP] 90 capsule     Sig: TAKE 1 CAPSULE BY MOUTH EVERY DAY     Psychiatry: Antidepressants - SNRI - duloxetine Failed - 04/25/2022  9:00 AM      Failed - Cr in normal range and within 360 days    Creatinine, Ser  Date Value Ref Range Status  08/06/2020 0.99 0.61 - 1.24 mg/dL Final         Failed - eGFR is 30 or above and within 360 days    GFR calc Af Amer  Date Value Ref Range Status  09/08/2019 91 >59 mL/min/1.73 Final   GFR, Estimated  Date Value Ref Range Status  08/06/2020 >60 >60 mL/min Final    Comment:    (NOTE) Calculated using the CKD-EPI Creatinine Equation (2021)          Failed - Valid encounter within last 6 months    Recent Outpatient Visits           5 months ago Bronchitis   [place] [facility] at [facility], Jesse Sans, MD   6 months ago Acute cough   [place] [facility] at [facility], Jesse Sans, MD   9 months ago Radiculopathy with lower extremity symptoms   [place] [facility] at [facility], Jesse Sans, MD   1 year ago Upper respiratory tract infection, unspecified type   [place] [facility] at [facility], Jesse Sans, MD   1 year ago Essential hypertension   [place] [facility] at [facility], Jesse Sans, MD       Future Appointments             In 2 weeks Glean Hess, MD [facility] and [facility] Medicine at [facility] [place] - Completed PHQ-2 or PHQ-9 in the last 360 days      Passed - Last BP in normal range    BP Readings from Last 1 Encounters:  10/31/21 104/60          metoprolol  succinate (TOPROL-XL) 25 MG 24 hr tablet [Pharmacy Med Name: METOPROLOL SUCC ER 25 MG TAB] 90 tablet     Sig: TAKE 1 TABLET (25 MG TOTAL) BY MOUTH AT BEDTIME. TAKE WITH OR IMMEDIATELY FOLLOWING A MEAL.     Cardiovascular:  Beta Blockers Failed - 04/25/2022  9:00 AM      Failed - Valid encounter within last 6 months    Recent Outpatient Visits           5 months ago [place] and Sports Medicine at [facility], Jesse Sans, MD   6 months ago Acute cough   [place] [facility] at [facility], Jesse Sans, MD   9 months ago Radiculopathy with lower extremity symptoms   [place] [facility] at [facility], Jesse Sans, MD   1 year ago Upper respiratory tract infection, unspecified type   [facility] at [facility], Jesse Sans, MD  1 year ago Essential hypertension   [place] [facility] at [facility], Jesse Sans, MD       Future Appointments             In 2 weeks Army Melia Jesse Sans, MD [facility] and [facility] Medicine at [facility] [place] BP in normal range    BP Readings from Last 1 Encounters:  10/31/21 104/60         Passed - Last Heart Rate in normal range    Pulse Readings from Last 1 Encounters:  10/31/21 74

## 2022-04-25 NOTE — Telephone Encounter (Signed)
Tried calling pt. Sent in Cymbalta, and Metoprolol for 30 days to get patient to upcoming appt. Unable to leave VM- mailbox was full.  Jay Ford

## 2022-05-05 ENCOUNTER — Ambulatory Visit: Payer: Self-pay | Admitting: *Deleted

## 2022-05-05 NOTE — Telephone Encounter (Signed)
Summary: discuss medication   Caller requesting a refill on pts bp medication but does not know the name  Advised caller to contact pharmacy, caller states pharmacy was unable to assist her bc she does not know the name of the medication  Caller requesting a cb       Attempted to call back- left message to call office.

## 2022-05-05 NOTE — Telephone Encounter (Signed)
Third attempt to reach patient's wife- left message to call office. Attempted to call patient number listed- message: call can not be completed as dialed

## 2022-05-05 NOTE — Telephone Encounter (Signed)
Second attempt to reach spouse- left message to call office

## 2022-05-10 ENCOUNTER — Encounter: Payer: Self-pay | Admitting: Internal Medicine

## 2022-05-10 ENCOUNTER — Ambulatory Visit (INDEPENDENT_AMBULATORY_CARE_PROVIDER_SITE_OTHER): Payer: Medicare Other | Admitting: Internal Medicine

## 2022-05-10 VITALS — BP 124/70 | HR 70 | Ht 71.0 in | Wt 233.0 lb

## 2022-05-10 DIAGNOSIS — I10 Essential (primary) hypertension: Secondary | ICD-10-CM

## 2022-05-10 DIAGNOSIS — R7303 Prediabetes: Secondary | ICD-10-CM

## 2022-05-10 DIAGNOSIS — Z23 Encounter for immunization: Secondary | ICD-10-CM

## 2022-05-10 DIAGNOSIS — E782 Mixed hyperlipidemia: Secondary | ICD-10-CM | POA: Diagnosis not present

## 2022-05-10 DIAGNOSIS — G5713 Meralgia paresthetica, bilateral lower limbs: Secondary | ICD-10-CM | POA: Diagnosis not present

## 2022-05-10 DIAGNOSIS — A9232 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: Secondary | ICD-10-CM | POA: Diagnosis not present

## 2022-05-10 DIAGNOSIS — B356 Tinea cruris: Secondary | ICD-10-CM | POA: Diagnosis not present

## 2022-05-10 LAB — POCT GLYCOSYLATED HEMOGLOBIN (HGB A1C): Hemoglobin A1C: 5.6 % (ref 4.0–5.6)

## 2022-05-10 MED ORDER — METOPROLOL SUCCINATE ER 25 MG PO TB24: 25.0000 mg | ORAL_TABLET | Freq: Every day | ORAL | 1 refills | Status: DC

## 2022-05-10 MED ORDER — LOSARTAN POTASSIUM 50 MG PO TABS: 50.0000 mg | ORAL_TABLET | Freq: Every day | ORAL | 1 refills | Status: DC

## 2022-05-10 MED ORDER — DULOXETINE HCL 30 MG PO CPEP: 90.0000 mg | ORAL_CAPSULE | Freq: Every day | ORAL | 1 refills | Status: DC

## 2022-05-10 MED ORDER — FLUCONAZOLE 100 MG PO TABS: 100.0000 mg | ORAL_TABLET | Freq: Every day | ORAL | 0 refills | Status: AC

## 2022-05-10 NOTE — Assessment & Plan Note (Signed)
Moderate 10 yr risk 7.9% Not on statin therapy

## 2022-05-10 NOTE — Progress Notes (Signed)
Date:  05/10/2022   Name:  Jay Ford   DOB:  [birth year]   MRN:  [id number]   Chief Complaint: Hypertension  Hypertension This is a chronic problem. The problem is controlled. Pertinent negatives include no chest pain, headaches, palpitations or shortness of breath. Past treatments include beta blockers and angiotensin blockers. The current treatment provides significant improvement. There are no compliance problems.   Diabetes He presents for his follow-up diabetic visit. Diabetes type: prediabetes. Pertinent negatives for hypoglycemia include no dizziness, headaches or nervousness/anxiousness. Associated symptoms include foot paresthesias. Pertinent negatives for diabetes include no chest pain, no fatigue, no foot ulcerations, no visual change, no weakness and no weight loss. Current diabetic treatment includes diet. His weight is stable. An ACE inhibitor/angiotensin II receptor blocker is being taken.  Rash This is a recurrent problem. The affected locations include the groin. The rash is characterized by burning, itchiness and redness. He was exposed to nothing. Pertinent negatives include no cough, diarrhea, fatigue or shortness of breath.  Leg pain from [place] Nile Virus infection - on Cymbalta and CBD.  Increase to 60 mg Duloxetine helped some with no side effects.  Lab Results  Component Value Date   NA 138 08/11/2021   K 4.2 08/11/2021   CO2 24 (A) 08/11/2021   GLUCOSE 104 (H) 08/06/2020   BUN 16 08/11/2021   CREATININE 0.9 08/11/2021   CALCIUM 9.4 08/11/2021   EGFR 86 08/11/2021   GFRNONAA >60 08/06/2020   Lab Results  Component Value Date   CHOL 198 08/06/2020   HDL 48 08/06/2020   LDLCALC 121 (H) 08/06/2020   TRIG 146 08/06/2020   CHOLHDL 4.1 08/06/2020   Lab Results  Component Value Date   TSH 1.83 08/11/2021   Lab Results  Component Value Date   HGBA1C 5.6 05/10/2022   Lab Results  Component Value Date   WBC 5.2 08/06/2020   HGB 14.5 08/06/2020    HCT 44.2 08/06/2020   MCV 91.5 08/06/2020   PLT 192 08/06/2020   Lab Results  Component Value Date   ALT 26 08/11/2021   AST 23 08/11/2021   ALKPHOS 77 08/11/2021   BILITOT 0.7 08/06/2020   Lab Results  Component Value Date   VD25OH 36.8 10/19/2021     Review of Systems  Constitutional:  Negative for fatigue, unexpected weight change and weight loss.  HENT:  Negative for nosebleeds and trouble swallowing.   Eyes:  Negative for visual disturbance.  Respiratory:  Negative for cough, chest tightness, shortness of breath and wheezing.   Cardiovascular:  Negative for chest pain, palpitations and leg swelling.  Gastrointestinal:  Negative for abdominal pain, constipation and diarrhea.  Musculoskeletal:  Positive for arthralgias and myalgias.  Skin:  Positive for rash.  Neurological:  Positive for numbness. Negative for dizziness, weakness, light-headedness and headaches.  Psychiatric/Behavioral:  Negative for dysphoric mood and sleep disturbance. The patient is not nervous/anxious.     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   Obesity, Class I, BMI 30-34.9 06/30/2015   Plantar fasciitis 10/21/2014   Essential hypertension 02/13/2014   Meralgia paresthetica of both lower extremities 02/13/2014  Palpitations 10/09/2011   Hyperlipidemia    MI (mitral incompetence) 02/07/2011   OSA (obstructive sleep apnea) 02/07/2011   Hx of colonic polyp 02/07/2011   Testosterone deficiency 02/07/2011   Tubular adenoma 02/07/2011    Allergies  Allergen Reactions   Gabapentin Other (See Comments)    Is not right, made see things    Topiramate Rash    Blistered rash on face    Past Surgical History:  Procedure Laterality Date   COLONOSCOPY WITH PROPOFOL N/A 10/10/2019   Procedure: COLONOSCOPY WITH BIOPSY;  Surgeon: Lucilla Lame, MD;  Location: [place];  Service: Endoscopy;  Laterality: N/A;  priority 3   HAND SURGERY Bilateral 2013   carpal tunnel both hands   POLYPECTOMY N/A 10/10/2019   Procedure: POLYPECTOMY;  Surgeon: Lucilla Lame, MD;  Location: [place];  Service: Endoscopy;  Laterality: N/A;   ROTATOR CUFF REPAIR Bilateral 2013    Social History   Tobacco Use   Smoking status: Never   Smokeless tobacco: Never  Vaping Use   Vaping Use: Never used  Substance Use Topics   Alcohol use: No   Drug use: No     Medication list has been reviewed and updated.  Current Meds  Medication Sig   CANNABIDIOL PO Take by mouth. Using daily for pain   fluconazole (DIFLUCAN) 100 MG tablet Take 1 tablet (100 mg total) by mouth daily for 3 days.   Multiple Vitamin (MULTIVITAMIN) LIQD Take 5 mLs by mouth daily.   NON FORMULARY at bedtime. CPAP nightly   Vitamin D, Ergocalciferol, (DRISDOL) 1.25 MG (50000 UNIT) CAPS capsule Take 50,000 Units by mouth every 7 (seven) days.   [DISCONTINUED] DULoxetine (CYMBALTA) 60 MG capsule TAKE 1 CAPSULE BY MOUTH EVERY DAY   [DISCONTINUED] losartan (COZAAR) 50 MG tablet TAKE 1 TABLET BY MOUTH EVERY DAY   [DISCONTINUED] metoprolol succinate (TOPROL-XL) 25 MG 24 hr tablet Take 1 tablet (25 mg total) by mouth at bedtime. Take with or immediately following a meal.       05/10/2022    2:28 PM 07/19/2021   11:11 AM 04/12/2021    3:36 PM 02/01/2021    9:05 AM  GAD 7 : Generalized Anxiety Score  Nervous, Anxious, on Edge 0 0 0 0  Control/stop worrying 0 0 0 0  Worry too much - different things 0 0 0 0  Trouble relaxing 0 0 0 0  Restless 0 0 0 0  Easily annoyed or irritable 0 0 0 0  Afraid - awful might happen 0 0 0 0  Total GAD 7 Score 0 0 0 0  Anxiety  Difficulty Not difficult at all Not difficult at all Not difficult at all        05/10/2022    2:28 PM 02/03/2022    8:19 AM 07/19/2021   11:11 AM  Depression screen PHQ 2/9  Decreased Interest 0 0 1  Down, Depressed, Hopeless 0 0 0  PHQ - 2 Score 0 0 1  Altered sleeping 0  0  Tired, decreased energy 2  1  Change in appetite 0  0  Feeling bad or failure about yourself  0  0  Trouble concentrating 1  0  Moving slowly or fidgety/restless 0  0  Suicidal thoughts 0  0  PHQ-9 Score 3  2  Difficult doing work/chores Somewhat difficult  Not difficult at all    BP Readings from Last 3 Encounters:  05/10/22 124/70  10/31/21 104/60  10/19/21 116/70  Physical Exam Vitals and nursing note reviewed.  Constitutional:      General: He is not in acute distress.    Appearance: Normal appearance. He is well-developed.  HENT:     Head: Normocephalic and atraumatic.  Neck:     Vascular: No carotid bruit.  Cardiovascular:     Rate and Rhythm: Normal rate and regular rhythm.  Pulmonary:     Effort: Pulmonary effort is normal. No respiratory distress.     Breath sounds: No wheezing or rhonchi.  Musculoskeletal:        General: Tenderness present.     Cervical back: Normal range of motion.     Right lower leg: No edema.     Left lower leg: No edema.  Lymphadenopathy:     Cervical: No cervical adenopathy.  Skin:    General: Skin is warm and dry.     Capillary Refill: Capillary refill takes less than 2 seconds.     Findings: No rash.  Neurological:     General: No focal deficit present.     Mental Status: He is alert and oriented to person, place, and time.  Psychiatric:        Mood and Affect: Mood normal.        Behavior: Behavior normal.     Wt Readings from Last 3 Encounters:  05/10/22 233 lb (105.7 kg)  10/31/21 232 lb (105.2 kg)  10/19/21 227 lb (103 kg)    BP 124/70   Pulse 70   Ht _0  (1.803 m)   Wt 233 lb (105.7 kg)   SpO2 96%   BMI 32.50 kg/m    Assessment and Plan: Problem List Items Addressed This Visit       Cardiovascular and Mediastinum   Essential hypertension - Primary (Chronic)    Onset before 2015 On Losartan and metoprolol with good control of BP       Relevant Medications   losartan (COZAAR) 50 MG tablet   metoprolol succinate (TOPROL-XL) 25 MG 24 hr tablet     Nervous and Auditory   Meralgia paresthetica of both lower extremities (Chronic)    Chronic pain - no benefit from gabapentin Started Cymbalta and increased to 60 mg last visit with some benefit Will increase to 90 mg. 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)   Relevant Medications   DULoxetine (CYMBALTA) 30 MG capsule   fluconazole (DIFLUCAN) 100 MG tablet     Musculoskeletal and Integument   Tinea cruris   Relevant Medications   fluconazole (DIFLUCAN) 100 MG tablet     Other   Hyperlipidemia (Chronic)    Moderate 10 yr risk 7.9% Not on statin therapy      Relevant Medications   losartan (COZAAR) 50 MG tablet   metoprolol succinate (TOPROL-XL) 25 MG 24 hr tablet   Prediabetes (Chronic)    07/2021 A1C 5.9 Today improved to 5.6 He will continue to limit sweets and sweet tea      Relevant Orders   POCT glycosylated hemoglobin (Hb A1C) (Completed)   Other Visit Diagnoses     Need for immunization against influenza       Relevant Orders   Flu Vaccine QUAD 85moIM (Fluarix, Fluzone & Alfiuria Quad PF) (Completed)        Partially dictated using DEditor, commissioning Any errors are unintentional.  LHalina Maidens MD [facility]  05/10/2022

## 2022-05-10 NOTE — Assessment & Plan Note (Signed)
Onset before 2015 On Losartan and metoprolol with good control of BP

## 2022-05-10 NOTE — Assessment & Plan Note (Signed)
Chronic pain - no benefit from gabapentin Started Cymbalta and increased to 60 mg last visit with some benefit Will increase to 90 mg. Continue CBD

## 2022-05-10 NOTE — Assessment & Plan Note (Addendum)
07/2021 A1C 5.9 Today improved to 5.6 He will continue to limit sweets and sweet tea

## 2022-05-12 IMAGING — MR MR HEAD WO/W CM
15 series · 48 of 48 positions shown · IV contrast (gadavist)
Comparison: None.

CLINICAL DATA: 61-year-old male with multiple falls, right upper
extremity tremor, left lower extremity numbness. Remote history of
Mario Alberto Buch (20-30 years ago).

EXAM:
MRI HEAD WITHOUT AND WITH CONTRAST
TECHNIQUE: Multiplanar, multiecho pulse sequences of the brain and surrounding
structures were obtained without and with intravenous contrast.
CONTRAST:  10mL GADAVIST GADOBUTROL 1 MMOL/ML IV SOLN

[Series 5: ax dwi_tracew · axial · 3.0mm · 0.65mm/px · z∈[-108,+47]mm · 3 of 48 slices shown]
[im 1/48]
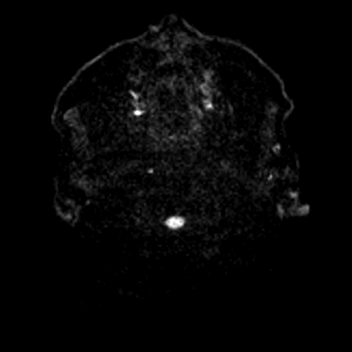
[im 24/48]
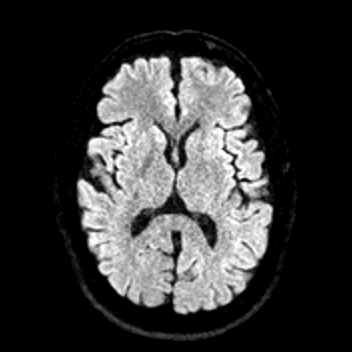
[im 48/48]
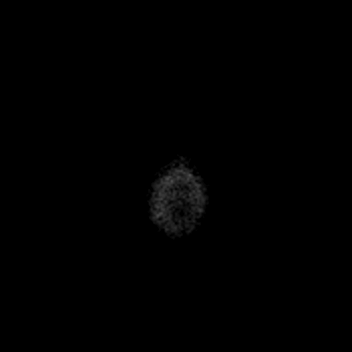

[Series 6: ax dwi_adc · axial · 3.0mm · 0.65mm/px · z∈[-108,+47]mm · 2 of 48 slices shown]
[im 1/48]
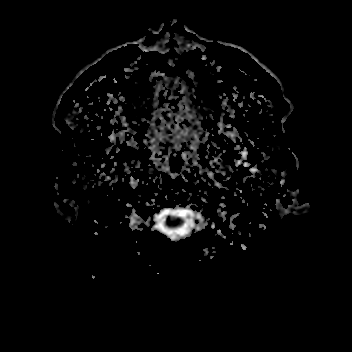
[im 48/48]
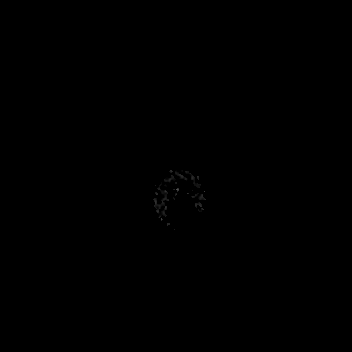

[Series 7: cor dwi_tracew · coronal · 5.0mm · 0.65mm/px · 2 of 40 slices shown]
[im 1/40]
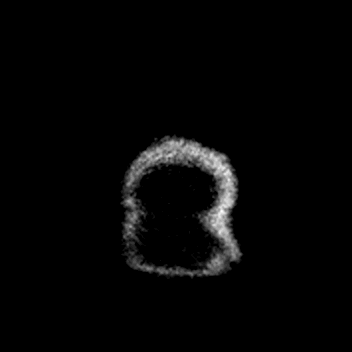
[im 40/40]
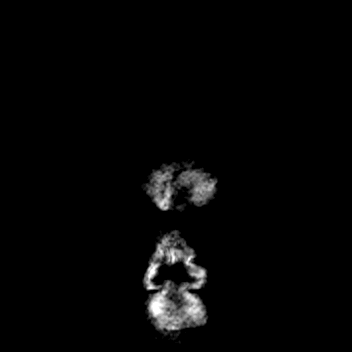

[Series 8: cor dwi_adc · coronal · 5.0mm · 0.65mm/px · 2 of 40 slices shown]
[im 1/40]
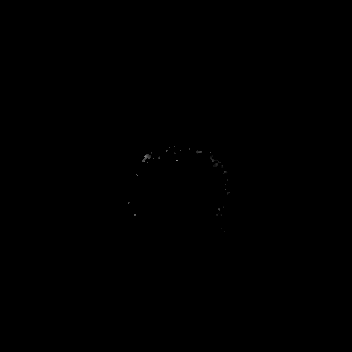
[im 40/40]
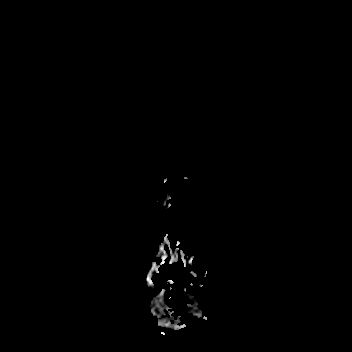

[Series 9: T1 · sagittal · 5.0mm · 0.62mm/px · 1 of 23 slices shown (1 of 2)]
[im 1/23]
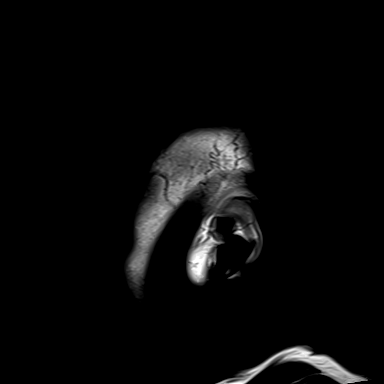

[Series 10: T2 · axial · 5.0mm · 0.53mm/px · 1 of 25 slices shown]
[im 1/25]
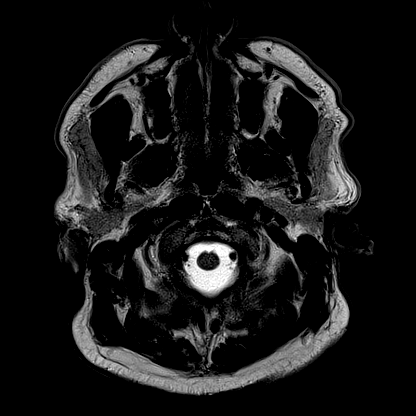

[Series 11: mag_images · axial · 3.0mm · 0.90mm/px · z∈[-118,+58]mm · 3 of 60 slices shown]
[im 1/60]
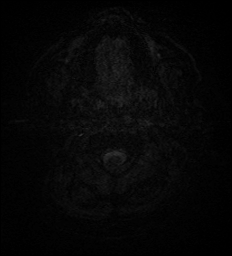
[im 30/60]
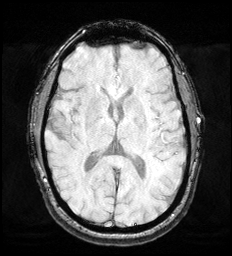
[im 60/60]
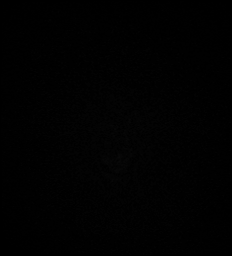

[Series 12: pha_images · axial · 3.0mm · 0.90mm/px · z∈[-118,+58]mm · 3 of 60 slices shown]
[im 1/60]
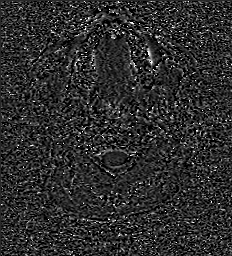
[im 30/60]
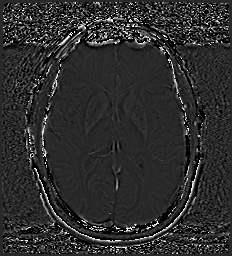
[im 60/60]
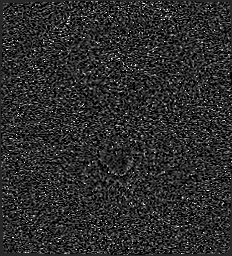

[Series 13: swi_images · axial · 3.0mm · 0.90mm/px · z∈[-118,+58]mm · 3 of 60 slices shown]
[im 1/60]
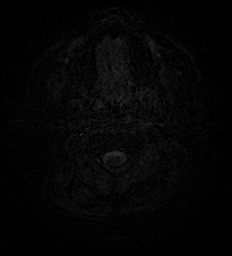
[im 30/60]
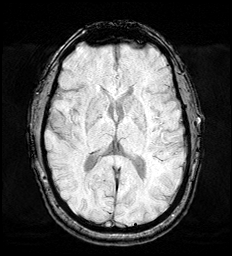
[im 60/60]
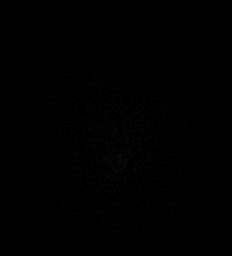

[Series 14: mip_images(sw) · axial · 24.0mm · 0.90mm/px · z∈[-108,+48]mm · 3 of 53 slices shown]
[im 1/53]
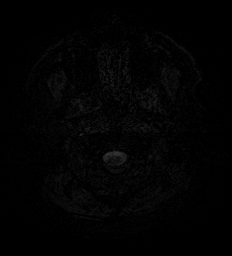
[im 27/53]
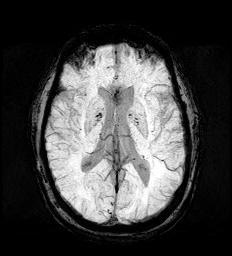
[im 53/53]
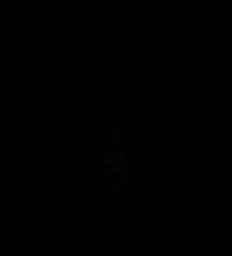

[Series 15: FLAIR · axial · 3.0mm · 0.53mm/px · z∈[-111,+50]mm · 3 of 55 slices shown]
[im 1/55]
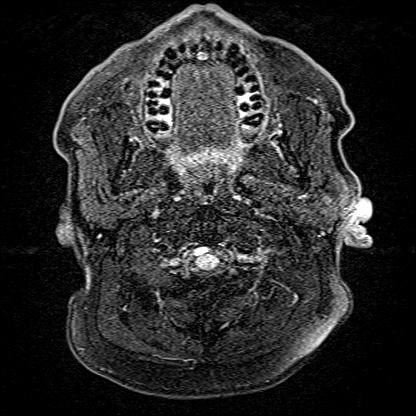
[im 28/55]
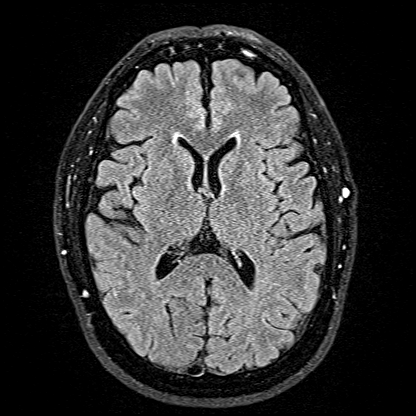
[im 55/55]
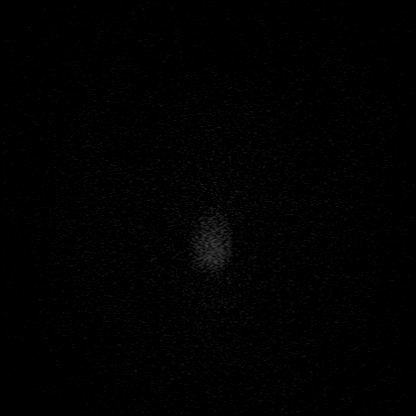

[Series 16: T1 · axial · 1.0mm · 0.98mm/px · z∈[-117,+58]mm · 9 of 176 slices shown (2 of 2)]
[im 1/176]
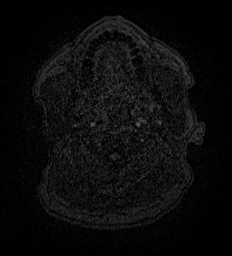
[im 22/176]
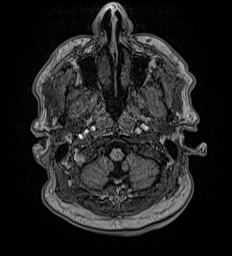
[im 44/176]
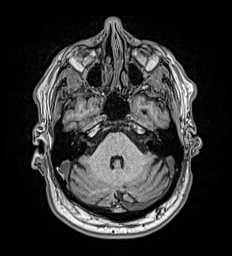
[im 66/176]
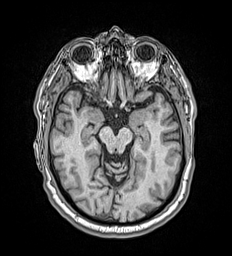
[im 88/176]
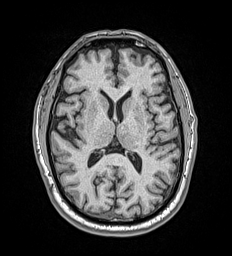
[im 110/176]
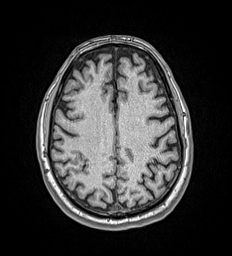
[im 132/176]
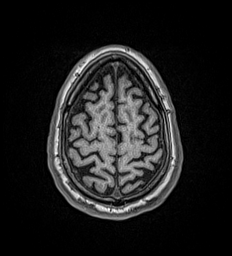
[im 154/176]
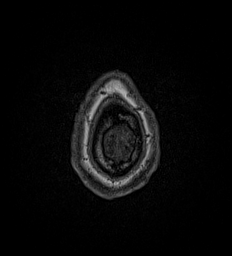
[im 176/176]
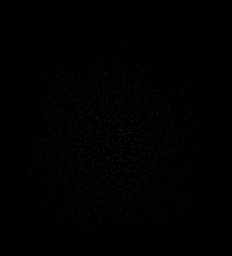

[Series 17: T2 post-contrast · coronal · 5.0mm · 0.57mm/px · 2 of 29 slices shown]
[im 1/29]
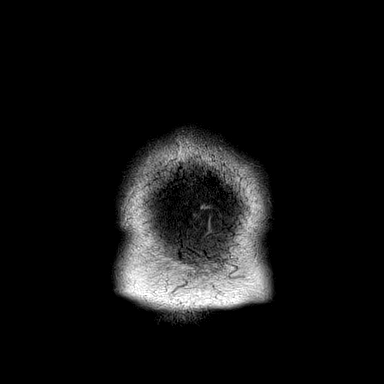
[im 29/29]
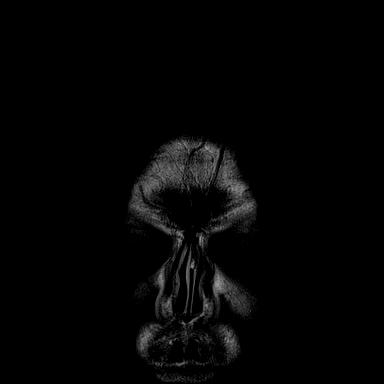

[Series 18: T1 post-contrast · axial · 1.0mm · 0.98mm/px · z∈[-117,+58]mm · 9 of 176 slices shown (1 of 2)]
[im 1/176]
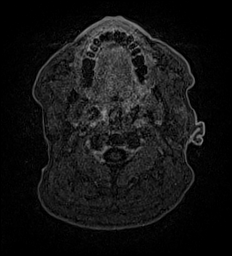
[im 22/176]
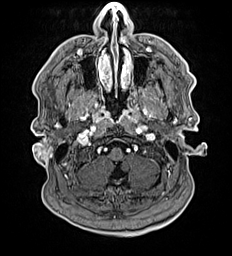
[im 44/176]
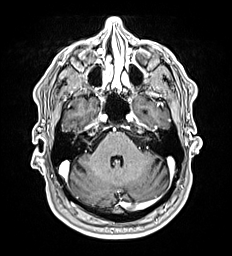
[im 66/176]
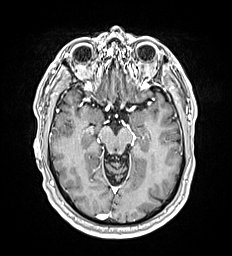
[im 88/176]
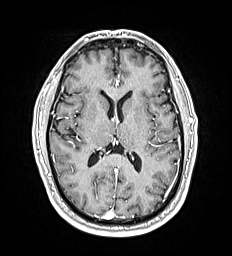
[im 110/176]
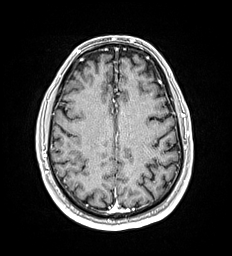
[im 132/176]
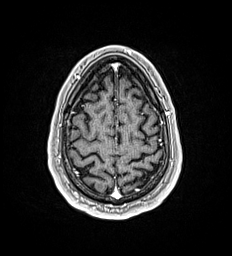
[im 154/176]
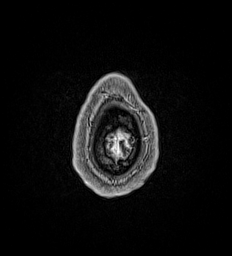
[im 176/176]
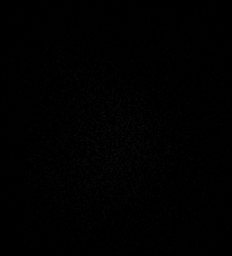

[Series 19: T1 post-contrast · coronal · 5.0mm · 0.57mm/px · 2 of 29 slices shown (2 of 2)]
[im 1/29]
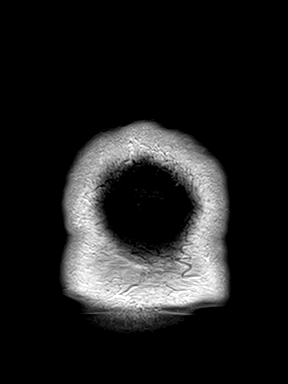
[im 29/29]
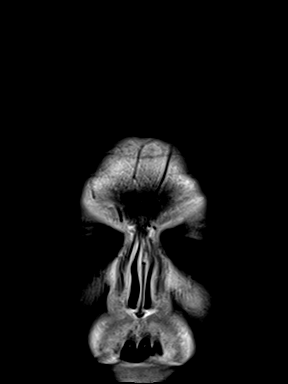

[48 of 48 positions shown; findings below may reference images not displayed]

FINDINGS: Brain: Cerebral volume is within normal limits for age. Incidental
cavum vergae, normal variant.

No restricted diffusion to suggest acute infarction. No midline
shift, mass effect, evidence of mass lesion, ventriculomegaly,
extra-axial collection or acute intracranial hemorrhage.
Cervicomedullary junction and pituitary are within normal limits.

Gray and white matter signal is within normal limits for age
throughout the brain. No cortical encephalomalacia or chronic
cerebral blood products identified. The deep gray matter nuclei,
brainstem and cerebellum appear normal.

No abnormal enhancement identified. No dural thickening.

Vascular: Major intracranial vascular flow voids are preserved.
Sinuses major dural venous sinuses are enhancing and appear to be
patent.

Skull and upper cervical spine: Negative for age visible cervical
spine. Visualized bone marrow signal is within normal limits.

Sinuses/Orbits: Negative.

Other: Visible internal auditory structures appear normal. Mastoids
are clear. Stylomastoid foramina, visible scalp and face appear
negative.
IMPRESSION: Normal for age MRI appearance of the brain.

## 2022-05-19 ENCOUNTER — Other Ambulatory Visit: Payer: Self-pay | Admitting: Internal Medicine

## 2022-05-19 DIAGNOSIS — I10 Essential (primary) hypertension: Secondary | ICD-10-CM

## 2022-05-19 DIAGNOSIS — A9232 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: Secondary | ICD-10-CM

## 2022-05-19 NOTE — Telephone Encounter (Unsigned)
Copied from [place] [id number]. Topic: General - Other >> May 19, 2022  9:25 AM Everette C wrote: Reason for CRM: Medication Refill - Medication: DULoxetine (CYMBALTA) 30 MG capsule [[id number]  losartan (COZAAR) 50 MG tablet [[id number]]  metoprolol succinate (TOPROL-XL) 25 MG 24 hr tablet [[id number]]   Has the patient contacted their pharmacy? Yes.  The patient has been directed to contact their PCP  (Agent: If no, request that the patient contact the pharmacy for the refill. If patient does not wish to contact the pharmacy document the reason why and proceed with request.) (Agent: If yes, when and what did the pharmacy advise?)  Preferred Pharmacy (with phone number or street name): [id number] [address]: [phone]: [phone]: Not open 24 hours   Has the patient been seen for an appointment in the last year OR does the patient have an upcoming appointment? Yes.    Agent: Please be advised that RX refills may take up to 3 business days. We ask that you follow-up with your pharmacy.

## 2022-05-20 NOTE — Telephone Encounter (Signed)
Last RF metoprolol:05/10/22 #90 1 RF               Losartan 05/10/22 #90 1 RF               Duloxetine: 05/10/22 #270 1 RF  Requested Prescriptions  Refused Prescriptions Disp Refills   metoprolol succinate (TOPROL-XL) 25 MG 24 hr tablet 90 tablet 1    Sig: Take 1 tablet (25 mg total) by mouth at bedtime. Take with or immediately following a meal.     Cardiovascular:  Beta Blockers Passed - 05/19/2022 10:02 AM      Passed - Last BP in normal range    BP Readings from Last 1 Encounters:  05/10/22 124/70         Passed - Last Heart Rate in normal range    Pulse Readings from Last 1 Encounters:  05/10/22 70         Passed - Valid encounter within last 6 months    Recent Outpatient Visits           1 week ago Essential hypertension   [place] [facility] at [facility], Jesse Sans, MD   6 months ago [place] and Sports Medicine at [facility], Jesse Sans, MD   7 months ago Acute cough   [place] [facility] at [facility], Jesse Sans, MD   10 months ago Radiculopathy with lower extremity symptoms   [place] [facility] at [facility], Jesse Sans, MD   1 year ago Upper respiratory tract infection, unspecified type   [facility] at [facility], Jesse Sans, MD       Future Appointments             In 5 months Glean Hess, MD [facility]             losartan (COZAAR) 50 MG tablet 90 tablet 1    Sig: Take 1 tablet (50 mg total) by mouth daily.     Cardiovascular:  Angiotensin Receptor Blockers Failed - 05/19/2022 10:02 AM      Failed - Cr in normal range and within 180 days    Creatinine  Date Value Ref Range Status  08/11/2021 0.9 0.6 - 1.3 Final   Creatinine, Ser  Date Value Ref Range Status  08/06/2020 0.99 0.61 -  1.24 mg/dL Final         Failed - K in normal range and within 180 days    Potassium  Date Value Ref Range Status  08/11/2021 4.2 3.5 - 5.1 mEq/L Final         Passed - Patient is not pregnant      Passed - Last BP in normal range    BP Readings from Last 1 Encounters:  05/10/22 124/70         Passed - Valid encounter within last 6 months    Recent Outpatient Visits           1 week ago Essential hypertension   [place] [facility] at [facility], Jesse Sans, MD   6 months ago [place] at [facility], Jesse Sans, MD   7 months ago Acute cough   [place] [facility]  and Sports Medicine at [facility], Jesse Sans, MD   10 months ago Radiculopathy with lower extremity symptoms   [place] [facility] at [facility], Jesse Sans, MD   1 year ago Upper respiratory tract infection, unspecified type   [facility] and [facility] Medicine at [facility], Jesse Sans, MD       Future Appointments             In 5 months Glean Hess, MD [facility] Primary Care and Sports Medicine at [facility]             DULoxetine (CYMBALTA) 30 MG capsule 270 capsule 1    Sig: Take 3 capsules (90 mg total) by mouth daily. TAKE 1 CAPSULE BY MOUTH EVERY DAY     Psychiatry: Antidepressants - SNRI - duloxetine Passed - 05/19/2022 10:02 AM      Passed - Cr in normal range and within 360 days    Creatinine  Date Value Ref Range Status  08/11/2021 0.9 0.6 - 1.3 Final   Creatinine, Ser  Date Value Ref Range Status  08/06/2020 0.99 0.61 - 1.24 mg/dL Final         Passed - eGFR is 30 or above and within 360 days    GFR calc Af Amer  Date Value Ref Range Status  09/08/2019 91 >59 mL/min/1.73 Final   GFR, Estimated  Date Value Ref Range Status  08/06/2020 >60 >60 mL/min Final    Comment:    (NOTE) Calculated  using the CKD-EPI Creatinine Equation (2021)    eGFR  Date Value Ref Range Status  08/11/2021 86  Final         Passed - Completed PHQ-2 or PHQ-9 in the last 360 days      Passed - Last BP in normal range    BP Readings from Last 1 Encounters:  05/10/22 124/70         Passed - Valid encounter within last 6 months    Recent Outpatient Visits           1 week ago Essential hypertension   [place] [facility] at [facility], Jesse Sans, MD   6 months ago [place] and Sports Medicine at [facility], Jesse Sans, MD   7 months ago Acute cough   [place] [facility] Medicine at [facility], Jesse Sans, MD   10 months ago Radiculopathy with lower extremity symptoms   [place] [facility] and Sports Medicine at [facility], Jesse Sans, MD   1 year ago Upper respiratory tract infection, unspecified type   [facility] and [facility] Medicine at [facility], Jesse Sans, MD       Future Appointments             In 5 months Army Melia, Jesse Sans, MD [facility] Primary Care and Sports Medicine at [facility]

## 2022-05-21 ENCOUNTER — Other Ambulatory Visit: Payer: Self-pay | Admitting: Internal Medicine

## 2022-05-21 DIAGNOSIS — A9232 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: Secondary | ICD-10-CM

## 2022-05-23 NOTE — Telephone Encounter (Signed)
Unable to refill per protocol, Rx expired. Medication was discontinued 05/10/22, dose change. Will refuse.  Requested Prescriptions  Pending Prescriptions Disp Refills   DULoxetine (CYMBALTA) 60 MG capsule [Pharmacy Med Name: DULOXETINE HCL DR 60 MG CAP] 30 capsule 0    Sig: TAKE 1 CAPSULE BY MOUTH EVERY DAY     Psychiatry: Antidepressants - SNRI - duloxetine Passed - 05/21/2022  9:19 AM      Passed - Cr in normal range and within 360 days    Creatinine  Date Value Ref Range Status  08/11/2021 0.9 0.6 - 1.3 Final   Creatinine, Ser  Date Value Ref Range Status  08/06/2020 0.99 0.61 - 1.24 mg/dL Final         Passed - eGFR is 30 or above and within 360 days    GFR calc Af Amer  Date Value Ref Range Status  09/08/2019 91 >59 mL/min/1.73 Final   GFR, Estimated  Date Value Ref Range Status  08/06/2020 >60 >60 mL/min Final    Comment:    (NOTE) Calculated using the CKD-EPI Creatinine Equation (2021)    eGFR  Date Value Ref Range Status  08/11/2021 86  Final         Passed - Completed PHQ-2 or PHQ-9 in the last 360 days      Passed - Last BP in normal range    BP Readings from Last 1 Encounters:  05/10/22 124/70         Passed - Valid encounter within last 6 months    Recent Outpatient Visits           1 week ago Essential hypertension   [place] [facility] at [facility], Jesse Sans, MD   6 months ago [place] and Sports Medicine at [facility], Jesse Sans, MD   7 months ago Acute cough   [place] [facility] and Sports Medicine at [facility], Jesse Sans, MD   10 months ago Radiculopathy with lower extremity symptoms   [place] [facility] and [facility] Medicine at [facility], Jesse Sans, MD   1 year ago Upper respiratory tract infection, unspecified type   [facility] and Sports Medicine at [facility], Jesse Sans, MD       Future  Appointments             In 5 months Army Melia, Jesse Sans, MD [facility] Primary Care and Sports Medicine at [facility], [facility]

## 2022-05-25 ENCOUNTER — Other Ambulatory Visit: Payer: Self-pay

## 2022-05-25 ENCOUNTER — Telehealth: Payer: Self-pay | Admitting: Internal Medicine

## 2022-05-25 DIAGNOSIS — A9232 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: Secondary | ICD-10-CM

## 2022-05-25 MED ORDER — DULOXETINE HCL 30 MG PO CPEP: 90.0000 mg | ORAL_CAPSULE | Freq: Every day | ORAL | 1 refills | Status: DC

## 2022-05-25 NOTE — Telephone Encounter (Signed)
Pts Rx for DULoxetine (CYMBALTA) 30 MG capsule has 2 sets of directions / pt can not fill until provider calls pharmacy to verify directions / they direction say take 3 daily and take 1 every day / please advise

## 2022-05-25 NOTE — Telephone Encounter (Signed)
Refill changed.  KP

## 2022-10-07 DIAGNOSIS — R079 Chest pain, unspecified: Secondary | ICD-10-CM | POA: Diagnosis not present

## 2022-10-07 DIAGNOSIS — I10 Essential (primary) hypertension: Secondary | ICD-10-CM | POA: Diagnosis not present

## 2022-10-07 DIAGNOSIS — Z79899 Other long term (current) drug therapy: Secondary | ICD-10-CM | POA: Diagnosis not present

## 2022-10-07 DIAGNOSIS — R0789 Other chest pain: Secondary | ICD-10-CM | POA: Diagnosis not present

## 2022-10-07 DIAGNOSIS — G4733 Obstructive sleep apnea (adult) (pediatric): Secondary | ICD-10-CM | POA: Diagnosis not present

## 2022-10-07 DIAGNOSIS — E119 Type 2 diabetes mellitus without complications: Secondary | ICD-10-CM | POA: Diagnosis not present

## 2022-10-07 DIAGNOSIS — E785 Hyperlipidemia, unspecified: Secondary | ICD-10-CM | POA: Diagnosis not present

## 2022-10-07 DIAGNOSIS — Z8673 Personal history of transient ischemic attack (TIA), and cerebral infarction without residual deficits: Secondary | ICD-10-CM | POA: Diagnosis not present

## 2022-10-07 DIAGNOSIS — R110 Nausea: Secondary | ICD-10-CM | POA: Diagnosis not present

## 2022-10-08 DIAGNOSIS — R079 Chest pain, unspecified: Secondary | ICD-10-CM | POA: Diagnosis not present

## 2022-10-09 DIAGNOSIS — R609 Edema, unspecified: Secondary | ICD-10-CM | POA: Diagnosis not present

## 2022-10-09 DIAGNOSIS — R0789 Other chest pain: Secondary | ICD-10-CM | POA: Diagnosis not present

## 2022-10-09 DIAGNOSIS — R002 Palpitations: Secondary | ICD-10-CM | POA: Diagnosis not present

## 2022-10-09 DIAGNOSIS — R42 Dizziness and giddiness: Secondary | ICD-10-CM | POA: Diagnosis not present

## 2022-10-09 DIAGNOSIS — R55 Syncope and collapse: Secondary | ICD-10-CM | POA: Diagnosis not present

## 2022-10-09 DIAGNOSIS — R079 Chest pain, unspecified: Secondary | ICD-10-CM | POA: Diagnosis not present

## 2022-10-09 DIAGNOSIS — R011 Cardiac murmur, unspecified: Secondary | ICD-10-CM | POA: Diagnosis not present

## 2022-10-13 DIAGNOSIS — Z8673 Personal history of transient ischemic attack (TIA), and cerebral infarction without residual deficits: Secondary | ICD-10-CM | POA: Diagnosis not present

## 2022-10-13 DIAGNOSIS — E785 Hyperlipidemia, unspecified: Secondary | ICD-10-CM | POA: Diagnosis not present

## 2022-10-13 DIAGNOSIS — R0789 Other chest pain: Secondary | ICD-10-CM | POA: Diagnosis not present

## 2022-10-13 DIAGNOSIS — I10 Essential (primary) hypertension: Secondary | ICD-10-CM | POA: Diagnosis not present

## 2022-10-13 DIAGNOSIS — R079 Chest pain, unspecified: Secondary | ICD-10-CM | POA: Diagnosis not present

## 2022-10-17 DIAGNOSIS — I358 Other nonrheumatic aortic valve disorders: Secondary | ICD-10-CM | POA: Diagnosis not present

## 2022-10-18 DIAGNOSIS — R011 Cardiac murmur, unspecified: Secondary | ICD-10-CM | POA: Diagnosis not present

## 2022-10-18 DIAGNOSIS — G4733 Obstructive sleep apnea (adult) (pediatric): Secondary | ICD-10-CM | POA: Diagnosis not present

## 2022-10-18 DIAGNOSIS — I10 Essential (primary) hypertension: Secondary | ICD-10-CM | POA: Diagnosis not present

## 2022-10-18 DIAGNOSIS — G038 Meningitis due to other specified causes: Secondary | ICD-10-CM | POA: Diagnosis not present

## 2022-10-18 DIAGNOSIS — N2889 Other specified disorders of kidney and ureter: Secondary | ICD-10-CM | POA: Diagnosis not present

## 2022-10-18 DIAGNOSIS — M940 Chondrocostal junction syndrome [Tietze]: Secondary | ICD-10-CM | POA: Diagnosis not present

## 2022-10-18 DIAGNOSIS — A9239 West Nile virus infection with other complications: Secondary | ICD-10-CM | POA: Diagnosis not present

## 2022-10-18 DIAGNOSIS — Z8673 Personal history of transient ischemic attack (TIA), and cerebral infarction without residual deficits: Secondary | ICD-10-CM | POA: Diagnosis not present

## 2022-10-18 DIAGNOSIS — G894 Chronic pain syndrome: Secondary | ICD-10-CM | POA: Diagnosis not present

## 2022-10-18 DIAGNOSIS — Z0000 Encounter for general adult medical examination without abnormal findings: Secondary | ICD-10-CM | POA: Diagnosis not present

## 2022-11-07 ENCOUNTER — Ambulatory Visit (INDEPENDENT_AMBULATORY_CARE_PROVIDER_SITE_OTHER): Payer: Medicare Other | Admitting: Urology

## 2022-11-07 ENCOUNTER — Encounter: Payer: Self-pay | Admitting: Urology

## 2022-11-07 VITALS — BP 146/72 | HR 60 | Ht 71.0 in | Wt 232.6 lb

## 2022-11-07 DIAGNOSIS — R351 Nocturia: Secondary | ICD-10-CM

## 2022-11-07 DIAGNOSIS — N2889 Other specified disorders of kidney and ureter: Secondary | ICD-10-CM

## 2022-11-07 NOTE — Patient Instructions (Signed)
Nocturia refers to the need to wake up during the night to urinate, which can disrupt your sleep and impact your overall well-being. Fortunately, there are several strategies you can employ to help prevent or manage nocturia. It's important to consult with your healthcare provider before making any significant changes to your routine. Here are some helpful strategies to consider:  One of the most common causes of overnight urination is sleep apnea.  Be sure to wear your CPAP machine, and this should decrease the amount you have to urinate overnight  Limit Fluid Intake Before Bed: Avoid drinking large amounts of fluids in the evening, especially within a few hours of bedtime. Consume most of your daily fluid intake earlier in the day to reduce the need to urinate at night.  Monitor Your Diet: Limit your intake of caffeine and alcohol, as these substances can increase urine production and irritate the bladder.  Avoid diet, "zero calorie," and artificially sweetened drinks, especially sodas, in the afternoon or evening. Be mindful of consuming foods and drinks with high water content before bedtime, such as watermelon and herbal teas.  Time Your Medications: If you're taking medications that contribute to increased urination, consult your healthcare provider about adjusting the timing of these medications to minimize their impact during the night.  Practice Double Voiding: Before going to bed, make an effort to empty your bladder twice within a short period. This can help reduce the amount of urine left in your bladder before sleep.  Bladder Training: Gradually increase the time between bathroom visits during the day to train your bladder to hold larger volumes of urine. Over time, this can help reduce the frequency of nighttime awakenings to urinate.  Elevate Your Legs During the Day: Elevating your legs during the day can help minimize fluid retention in your lower extremities, which might  reduce nighttime urination.  Pelvic Floor Exercises: Strengthening your pelvic floor muscles through Kegel exercises can help improve bladder control and potentially reduce the urge to urinate at night.  Create a Relaxing Bedtime Routine: Stress and anxiety can exacerbate nocturia. Engage in calming activities before bed, such as reading, listening to soothing music, or practicing relaxation techniques.  Stay Active: Engage in regular physical activity, but avoid intense exercise close to bedtime, as this can increase your body's demand for fluids.  Maintain a Healthy Weight: Excess weight can compress the bladder and contribute to bladder and urinary issues. Aim to achieve and maintain a healthy weight through a balanced diet and regular exercise.  Remember that every individual is unique, and the effectiveness of these strategies may vary. It's important to work with your healthcare provider to develop a plan that suits your specific needs and addresses any underlying causes of nocturia.

## 2022-11-07 NOTE — Progress Notes (Signed)
   11/07/22 12:59 PM   Jay Ford 1960/08/17 [id number]  CC: Left renal mass, nocturia  HPI: I saw Mr. Jay Ford today for the above issues.  He recently had a NM myocardial perfusion test at [facility] on 10/13/2022 and this apparently showed a round mass in the partially visualized left kidney, this was not measured.  I am unable to review those images.  No prior cross-sectional imaging to review.  He denies any flank pain or gross hematuria.  He reports nocturia 4-5 times per night over the last few months.  He has sleep apnea but has not been compliant with CPAP.  He denies any significant urinary symptoms during the day.   PMH: Past Medical History:  Diagnosis Date   Hyperlipidemia    Hypertension    controlled on meds   Sleep apnea    CPAP   Tremors of nervous system    Right upper extremity   West Nile Virus infection 2008   pain in knee-thigh in right leg    Surgical History: Past Surgical History:  Procedure Laterality Date   COLONOSCOPY WITH PROPOFOL N/A 10/10/2019   Procedure: COLONOSCOPY WITH BIOPSY;  Surgeon: Midge Minium, MD;  [facility];  Service: Endoscopy;  Laterality: N/A;  priority 3   HAND SURGERY Bilateral 2013   carpal tunnel both hands   POLYPECTOMY N/A 10/10/2019   Procedure: POLYPECTOMY;  Surgeon: Midge Minium, MD;  Location: [facility];  Service: Endoscopy;  Laterality: N/A;   ROTATOR CUFF REPAIR Bilateral 2013    Family History: Family History  Problem Relation Age of Onset   Cancer Father        prostate cancer   Diabetes Father    Heart attack Father    Cancer Mother        breast cancer   Diabetes Sister     Social History:  reports that he has never smoked. He has never used smokeless tobacco. He reports that he does not drink alcohol and does not use drugs.  Physical Exam: BP (!) 146/72 (BP Location: Left Arm, Patient Position: Sitting, Cuff Size: Large)   Pulse 60   Ht 5\' 11"  (1.803 m)   Wt 232 lb 9.6 oz  (105.5 kg)   BMI 32.44 kg/m    Constitutional:  Alert and oriented, No acute distress. Cardiovascular: No clubbing, cyanosis, or edema. Respiratory: Normal respiratory effort, no increased work of breathing. GI: Abdomen is soft, nontender, nondistended, no abdominal masses  Assessment & Plan:   62 year old male with reported left renal mass seen on NM myocardial perfusion scan [facility].  We discussed this could represent something simple and benign like a renal cyst, or something more worrisome like a renal mass or tumor.  I recommended a renal ultrasound for further evaluation, we discussed possible need for further imaging including CT and MRI pending those results.  Regarding his nocturia, I recommended resuming CPAP use, and the correlation between sleep apnea and nocturia discussed at length.   Call with renal ultrasound results    Jay Rams, MD 11/07/2022  [facility] [address], [address] [place], [place] [address] [phone]

## 2022-11-10 ENCOUNTER — Ambulatory Visit: Payer: Medicare Other | Admitting: Internal Medicine

## 2022-11-16 ENCOUNTER — Ambulatory Visit
Admission: RE | Admit: 2022-11-16 | Discharge: 2022-11-16 | Disposition: A | Discharge status: Home / Self Care | Payer: Medicare Other | Source: Ambulatory Visit | Attending: Urology | Admitting: Urology

## 2022-11-16 DIAGNOSIS — N2889 Other specified disorders of kidney and ureter: Secondary | ICD-10-CM | POA: Diagnosis not present

## 2022-11-16 DIAGNOSIS — N281 Cyst of kidney, acquired: Secondary | ICD-10-CM | POA: Diagnosis not present

## 2022-11-18 ENCOUNTER — Other Ambulatory Visit: Payer: Self-pay | Admitting: Internal Medicine

## 2022-11-18 DIAGNOSIS — A9232 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: Secondary | ICD-10-CM

## 2022-11-18 DIAGNOSIS — I10 Essential (primary) hypertension: Secondary | ICD-10-CM

## 2023-02-05 ENCOUNTER — Ambulatory Visit (INDEPENDENT_AMBULATORY_CARE_PROVIDER_SITE_OTHER): Payer: Medicare Other | Admitting: Internal Medicine

## 2023-02-05 VITALS — BP 127/75 | HR 64 | Resp 14 | Ht 66.0 in | Wt 230.0 lb

## 2023-02-05 DIAGNOSIS — E669 Obesity, unspecified: Secondary | ICD-10-CM | POA: Insufficient documentation

## 2023-02-05 DIAGNOSIS — G4733 Obstructive sleep apnea (adult) (pediatric): Secondary | ICD-10-CM | POA: Diagnosis not present

## 2023-02-05 DIAGNOSIS — I10 Essential (primary) hypertension: Secondary | ICD-10-CM | POA: Diagnosis not present

## 2023-02-05 NOTE — Progress Notes (Unsigned)
Sleep Medicine   Office Visit  Patient Name: Jay Ford DOB: [birth year] MRN [id number]    Chief Complaint: history of OSA   Brief History:  Dearl presents with a 62 year history of sleep apnea. He was previously on PAP therapy but has not used his machine for years.  He got away from using it due to not having anyone to service the machine.  This is worse in the supine position.   Sleep quality is average. This is noted most nights. The patient's bed partner reports  loud snoring and witnessed apnea at night. The patient relates the following symptoms: loud snoring, daytime sleepiness, and brain fog are also present. The patient goes to sleep at 9:30 pm and wakes up at 7 am.   Sleep quality is same when outside home environment.  Patient has noted restlessness of his legs at night.  The patient  relates no unusual behavior during the night.  The patient denies a history of psychiatric problems. The Epworth Sleepiness Score is 12 out of 24 .  The patient relates  Cardiovascular risk factors include: hypertension.     ROS  General: (-) fever, (-) chills, (-) night sweat Nose and Sinuses: (-) nasal stuffiness or itchiness, (-) postnasal drip, (-) nosebleeds, (-) sinus trouble. Mouth and Throat: (-) sore throat, (-) hoarseness. Neck: (-) swollen glands, (-) enlarged thyroid, (-) neck pain. Respiratory: - cough, - shortness of breath, - wheezing. Neurologic: + numbness, - tingling. Psychiatric: - anxiety, - depression Sleep behavior: -sleep paralysis -hypnogogic hallucinations -dream enactment      -vivid dreams -cataplexy -night terrors -sleep walking   Current Medication: Outpatient Encounter Medications as of 02/05/2023  Medication Sig   amLODipine-valsartan (EXFORGE) 5-160 MG tablet Take 1 tablet by mouth daily.   aspirin EC 81 MG tablet Take 1 tablet by mouth daily.   atorvastatin (LIPITOR) 40 MG tablet Take 1 tablet by mouth daily.   CANNABIDIOL PO Take by mouth. Using daily  for pain   cyanocobalamin (,VITAMIN B-12,) 1000 MCG/ML injection Inject 1 mL (1,000 mcg total) into the muscle every 30 (thirty) days. (Patient not taking: Reported on 05/10/2022)   DULoxetine (CYMBALTA) 30 MG capsule TAKE 3 CAPSULES BY MOUTH EVERY DAY   metoprolol succinate (TOPROL-XL) 25 MG 24 hr tablet TAKE 1 TABLET (25 MG TOTAL) BY MOUTH AT BEDTIME. TAKE WITH OR IMMEDIATELY FOLLOWING A MEAL.   Multiple Vitamin (MULTIVITAMIN) LIQD Take 5 mLs by mouth daily.   nitroGLYCERIN (NITROSTAT) 0.4 MG SL tablet Place 0.4 mg under the tongue every 5 (five) minutes as needed for chest pain.   NON FORMULARY at bedtime. CPAP nightly   Vitamin D, Ergocalciferol, (DRISDOL) 1.25 MG (50000 UNIT) CAPS capsule Take 50,000 Units by mouth every 7 (seven) days.   [DISCONTINUED] losartan (COZAAR) 50 MG tablet Take 1 tablet (50 mg total) by mouth daily.   No facility-administered encounter medications on file as of 02/05/2023.    Surgical History: Past Surgical History:  Procedure Laterality Date   COLONOSCOPY WITH PROPOFOL N/A 10/10/2019   Procedure: COLONOSCOPY WITH BIOPSY;  Surgeon: Midge Minium, MD;  Location: [facility];  Service: Endoscopy;  Laterality: N/A;  priority 3   HAND SURGERY Bilateral 2013   carpal tunnel both hands   POLYPECTOMY N/A 10/10/2019   Procedure: POLYPECTOMY;  Surgeon: Midge Minium, MD;  Location: [facility];  Service: Endoscopy;  Laterality: N/A;   ROTATOR CUFF REPAIR Bilateral 2013    Medical History: Past Medical History:  Diagnosis  Date   Hyperlipidemia    Hypertension    controlled on meds   Sleep apnea    CPAP   Tremors of nervous system    Right upper extremity   West Nile Virus infection 2008   pain in knee-thigh in right leg    Family History: Non contributory to the present illness  Social History: Social History   Socioeconomic History   Marital status: Married    Spouse name: Sergei Bortner   Number of children: 2   Years of education:  Not on file   Highest education level: High school graduate  Occupational History   Occupation: parent    Comment: on [employer] Disability  Tobacco Use   Smoking status: Never   Smokeless tobacco: Never  Vaping Use   Vaping status: Never Used  Substance and Sexual Activity   Alcohol use: No   Drug use: No   Sexual activity: Yes    Birth control/protection: None  Other Topics Concern   Not on file  Social History Narrative   Not on file   Social Determinants of Health   Financial Resource Strain: Low Risk  (05/10/2022)   Overall Financial Resource Strain (CARDIA)    Difficulty of Paying Living Expenses: Not hard at all  Food Insecurity: No Food Insecurity (05/10/2022)   Hunger Vital Sign    Worried About Running Out of Food in the Last Year: Never true    Ran Out of Food in the Last Year: Never true  Transportation Needs: No Transportation Needs (05/10/2022)   PRAPARE - Administrator, Civil Service (Medical): No    Lack of Transportation (Non-Medical): No  Physical Activity: Inactive (02/03/2022)   Exercise Vital Sign    Days of Exercise per Week: 0 days    Minutes of Exercise per Session: 0 min  Stress: No Stress Concern Present (02/03/2022)   [employer] of Occupational Health - Occupational Stress Questionnaire    Feeling of Stress : Not at all  Social Connections: Socially Integrated (02/03/2022)   Social Connection and Isolation Panel [NHANES]    Frequency of Communication with Friends and Family: Twice a week    Frequency of Social Gatherings with Friends and Family: Once a week    Attends Religious Services: More than 4 times per year    Active Member of [employer] or Organizations: Yes    Attends Engineer, structural: More than 4 times per year    Marital Status: Married  Catering manager Violence: Not At Risk (05/10/2022)   Humiliation, Afraid, Rape, and Kick questionnaire    Fear of Current or Ex-Partner: No    Emotionally Abused: No     Physically Abused: No    Sexually Abused: No    Vital Signs: Blood pressure 127/75, pulse 64, resp. rate 14, height 5\' 6"  (1.676 m), weight 230 lb (104.3 kg), SpO2 96%. Body mass index is 37.12 kg/m.   Examination: General Appearance: The patient is well-developed, well-nourished, and in no distress. Neck Circumference: 45 cm Skin: Gross inspection of skin unremarkable. Head: normocephalic, no gross deformities. Eyes: no gross deformities noted. ENT: ears appear grossly normal Neurologic: Alert and oriented. No involuntary movements.    STOP BANG RISK ASSESSMENT S (snore) Have you been told that you snore?     YES   T (tired) Are you often tired, fatigued, or sleepy during the day?   YES  O (obstruction) Do you stop breathing, choke, or gasp during sleep? NO  P (pressure) Do you have or are you being treated for high blood pressure? YES   B (BMI) Is your body index greater than 35 kg/m? YES   A (age) Are you 55 years old or older? YES   N (neck) Do you have a neck circumference greater than 16 inches?   YES   G (gender) Are you a male? YES   TOTAL STOP/BANG "YES" ANSWERS 7                                                               A STOP-Bang score of 2 or less is considered low risk, and a score of 5 or more is high risk for having either moderate or severe OSA. For people who score 3 or 4, doctors may need to perform further assessment to determine how likely they are to have OSA.         EPWORTH SLEEPINESS SCALE:  Scale:  (0)= no chance of dozing; (1)= slight chance of dozing; (2)= moderate chance of dozing; (3)= high chance of dozing  Chance  Situtation    Sitting and reading: 2    Watching TV: 2    Sitting Inactive in public: 1    As a passenger in car: 2      Lying down to rest: 3    Sitting and talking: 0    Sitting quielty after lunch: 2    In a car, stopped in traffic: 0   TOTAL SCORE:   12 out of 24    SLEEP STUDIES:  Moderate  OSA on PSG in 2015 - sleep study not on file   LABS: No results found for this or any previous visit (from the past 2160 hour(s)).  Radiology: Ultrasound renal complete  Result Date: 11/17/2022 CLINICAL DATA:  Possible left renal mass EXAM: RENAL / URINARY TRACT ULTRASOUND COMPLETE COMPARISON:  None Available. FINDINGS: Right Kidney: Renal measurements: 13.0 x 6.1 x 5.5 cm = volume: 227 mL. Contains a 3.1 cm cyst. No follow-up imaging recommended for the cyst. Left Kidney: Renal measurements: 12.2 x 5.9 x 4.9 cm = volume: 185 mL. Contains 2 cysts with the largest measuring 2.3 cm. No follow-up imaging recommended for the cysts. Bladder: Appears normal for degree of bladder distention. Other: None. IMPRESSION: No significant abnormalities. No renal mass identified. Electronically Signed   By: Gerome Sam III M.D.   On: 11/17/2022 07:33    No results found.  No results found.    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 OSA (obstructive sleep apnea) PLAN OSA:   Patient evaluation suggests high risk of sleep disordered breathing due to history of OSA with AHI of 16 on PSG from 2016. He has snoring, daytime  sleepiness, brain fog, morning headaches.   Patient has comorbid cardiovascular risk factors including: hypertension which could be exacerbated by pathologic sleep-disordered breathing.  Suggest: PSG to assess/treat the patient's sleep disordered breathing. The patient was also counselled on weight loss to optimize sleep health.   2. Essential hypertension Hypertension Counseling:   The following hypertensive lifestyle modification were recommended and discussed:  1. Limiting alcohol intake to less than 1 oz/day of ethanol:(24 oz of beer or 8 oz of wine or 2 oz of 100-proof whiskey). 2. Take baby ASA 81 mg daily. 3. Importance of regular aerobic exercise and losing weight. 4. Reduce dietary saturated fat and cholesterol intake for overall cardiovascular health. 5. Maintaining adequate dietary potassium, calcium, and magnesium intake. 6. Regular monitoring of the blood pressure. 7. Reduce sodium intake to less than 100 mmol/day (less than 2.3 gm of sodium or less than 6 gm of sodium choride)    3. Obesity (BMI 30-39.9) Obesity Counseling: Had a lengthy discussion regarding patients BMI and weight issues. Patient was instructed on portion control as well as increased activity. Also discussed caloric restrictions with trying to maintain intake less than 2000 Kcal. Discussions were made in accordance with the 5As of weight management. Simple actions such as not eating late and if able to, taking a walk is suggested.     General Counseling: I have discussed the findings of the evaluation and examination with Maurine Minister.  I have also discussed any further diagnostic evaluation thatmay be needed or ordered today. Zenas verbalizes understanding of the findings of todays visit. We also reviewed his medications today and discussed drug interactions and side effects including but not limited excessive drowsiness and altered mental states. We also discussed that there is always a risk not just to him but also  people around him. he has been encouraged to call the office with any questions or concerns that should arise related to todays visit.  No orders of the defined types were placed in this encounter.       I have personally obtained a history, evaluated the patient, evaluated pertinent data, formulated the assessment and plan and placed orders. This patient was seen today by Emmaline Kluver, PA-C in collaboration with Dr. Freda Munro.     Yevonne Pax, MD [facility] Diplomate ABMS Pulmonary and Critical Care Medicine Sleep medicine

## 2023-02-05 NOTE — Patient Instructions (Signed)
Living With Sleep Apnea Sleep apnea is a condition in which breathing pauses or becomes shallow during sleep. Sleep apnea is most commonly caused by a collapsed or blocked airway. People with sleep apnea usually snore loudly. They may have times when they gasp and stop breathing for 10 seconds or more during sleep. This may happen many times during the night. The breaks in breathing also interrupt the deep sleep that you need to feel rested. Even if you do not completely wake up from the gaps in breathing, your sleep may not be restful and you feel tired during the day. You may also have a headache in the morning and low energy during the day, and you may feel anxious or depressed. How can sleep apnea affect me? Sleep apnea increases your chances of extreme tiredness during the day (daytime fatigue). It can also increase your risk for health conditions, such as: Heart attack. Stroke. Obesity. Type 2 diabetes. Heart failure. Irregular heartbeat. High blood pressure. If you have daytime fatigue as a result of sleep apnea, you may be more likely to: Perform poorly at school or work. Fall asleep while driving. Have difficulty with attention. Develop depression or anxiety. Have sexual dysfunction. What actions can I take to manage sleep apnea? Sleep apnea treatment  If you were given a device to open your airway while you sleep, use it only as told by your health care provider. You may be given: An oral appliance. This is a custom-made mouthpiece that shifts your lower jaw forward. A continuous positive airway pressure (CPAP) device. This device blows air through a mask when you breathe out (exhale). A nasal expiratory positive airway pressure (EPAP) device. This device has valves that you put into each nostril. A bi-level positive airway pressure (BIPAP) device. This device blows air through a mask when you breathe in (inhale) and breathe out (exhale). You may need surgery if other treatments  do not work for you. Sleep habits Go to sleep and wake up at the same time every day. This helps set your internal clock (circadian rhythm) for sleeping. If you stay up later than usual, such as on weekends, try to get up in the morning within 2 hours of your normal wake time. Try to get at least 7-9 hours of sleep each night. Stop using a computer, tablet, and mobile phone a few hours before bedtime. Do not take long naps during the day. If you nap, limit it to 30 minutes. Have a relaxing bedtime routine. Reading or listening to music may relax you and help you sleep. Use your bedroom only for sleep. Keep your television and computer out of your bedroom. Keep your bedroom cool, dark, and quiet. Use a supportive mattress and pillows. Follow your health care provider's instructions for other changes to sleep habits. Nutrition Do not eat heavy meals in the evening. Do not have caffeine in the later part of the day. The effects of caffeine can last for more than 5 hours. Follow your health care provider's or dietitian's instructions for any diet changes. Lifestyle     Do not drink alcohol before bedtime. Alcohol can cause you to fall asleep at first, but then it can cause you to wake up in the middle of the night and have trouble getting back to sleep. Do not use any products that contain nicotine or tobacco. These products include cigarettes, chewing tobacco, and vaping devices, such as e-cigarettes. If you need help quitting, ask your health care provider. Medicines Take  over-the-counter and prescription medicines only as told by your health care provider. Do not use over-the-counter sleep medicine. You can become dependent on this medicine, and it can make sleep apnea worse. Do not use medicines, such as sedatives and narcotics, unless told by your health care provider. Activity Exercise on most days, but avoid exercising in the evening. Exercising near bedtime can interfere with  sleeping. If possible, spend time outside every day. Natural light helps regulate your circadian rhythm. General information Lose weight if you need to, and maintain a healthy weight. Keep all follow-up visits. This is important. If you are having surgery, make sure to tell your health care provider that you have sleep apnea. You may need to bring your device with you. Where to find more information Learn more about sleep apnea and daytime fatigue from: American Sleep Association: sleepassociation.org National Sleep Foundation: sleepfoundation.org National Heart, Lung, and Blood Institute: [employer] Summary Sleep apnea is a condition in which breathing pauses or becomes shallow during sleep. Sleep apnea can cause daytime fatigue and other serious health conditions. You may need to wear a device while sleeping to help keep your airway open. If you are having surgery, make sure to tell your health care provider that you have sleep apnea. You may need to bring your device with you. Making changes to sleep habits, diet, lifestyle, and activity can help you manage sleep apnea. This information is not intended to replace advice given to you by your health care provider. Make sure you discuss any questions you have with your health care provider. Document Revised: 12/15/2020 Document Reviewed: 04/16/2020 Elsevier Patient Education  2023 [employer].

## 2023-02-06 ENCOUNTER — Ambulatory Visit (INDEPENDENT_AMBULATORY_CARE_PROVIDER_SITE_OTHER): Payer: Medicare Other

## 2023-02-06 DIAGNOSIS — Z0000 Encounter for general adult medical examination without abnormal findings: Secondary | ICD-10-CM

## 2023-02-06 NOTE — Patient Instructions (Addendum)
Jay Ford , Thank you for taking time to come for your Medicare Wellness Visit. I appreciate your ongoing commitment to your health goals. Please review the following plan we discussed and let me know if I can assist you in the future.   Referrals/Orders/Follow-Ups/Clinician Recommendations: none  This is a list of the screening recommended for you and due dates:  Health Maintenance  Topic Date Due   COVID-19 Vaccine (1) Never done   Zoster (Shingles) Vaccine (1 of 2) Never done   Flu Shot  12/21/2022   Medicare Annual Wellness Visit  02/06/2024   Colon Cancer Screening  10/09/2024   DTaP/Tdap/Td vaccine (2 - Td or Tdap) 06/29/2025   Hepatitis C Screening  Completed   HIV Screening  Completed   Pneumococcal Vaccination  Aged Out   HPV Vaccine  Aged Out   Hemoglobin A1C  Discontinued    Advanced directives: (ACP Link)Information on Advanced Care Planning can be found at [facility] Advance Health Care Directives Advance Health Care Directives ([email or web address]   Next Medicare Annual Wellness Visit scheduled for next year: Yes   02/12/24 @ 9:45 am by video

## 2023-02-06 NOTE — Progress Notes (Signed)
Subjective:   Jay Ford is a 62 y.o. male who presents for Medicare Annual/Subsequent preventive examination.  Visit Complete: Virtual  I connected with  [name] on 02/06/23 by a audio enabled telemedicine application and verified that I am speaking with the correct person using two identifiers.  Patient Location: Home  Provider Location: Office/Clinic  I discussed the limitations of evaluation and management by telemedicine. The patient expressed understanding and agreed to proceed.  Vital Signs: Unable to obtain new vitals due to this being a telehealth visit.     Objective:    There were no vitals filed for this visit. There is no height or weight on file to calculate BMI.     02/06/2023   10:35 AM 01/26/2021    8:50 AM 01/07/2020    3:08 PM 10/10/2019   10:09 AM 04/10/2018    1:47 PM 03/05/2017    1:45 PM  Advanced Directives  Does Patient Have a Medical Advance Directive? No No No No No No  Does patient want to make changes to medical advance directive?    No - Patient declined  Yes (MAU/Ambulatory/Procedural Areas - Information given)  Would patient like information on creating a medical advance directive? No - Patient declined Yes (MAU/Ambulatory/Procedural Areas - Information given) No - Patient declined No - Patient declined Yes (MAU/Ambulatory/Procedural Areas - Information given)     Current Medications (verified) Outpatient Encounter Medications as of 02/06/2023  Medication Sig   amLODipine-valsartan (EXFORGE) 5-160 MG tablet Take 1 tablet by mouth daily.   aspirin EC 81 MG tablet Take 1 tablet by mouth daily.   atorvastatin (LIPITOR) 40 MG tablet Take 1 tablet by mouth daily.   CANNABIDIOL PO Take by mouth. Using daily for pain   cyanocobalamin (,VITAMIN B-12,) 1000 MCG/ML injection Inject 1 mL (1,000 mcg total) into the muscle every 30 (thirty) days.   DULoxetine (CYMBALTA) 30 MG capsule TAKE 3 CAPSULES BY MOUTH EVERY DAY   metoprolol succinate  (TOPROL-XL) 25 MG 24 hr tablet TAKE 1 TABLET (25 MG TOTAL) BY MOUTH AT BEDTIME. TAKE WITH OR IMMEDIATELY FOLLOWING A MEAL.   Multiple Vitamin (MULTIVITAMIN) LIQD Take 5 mLs by mouth daily.   nitroGLYCERIN (NITROSTAT) 0.4 MG SL tablet Place 0.4 mg under the tongue every 5 (five) minutes as needed for chest pain.   NON FORMULARY at bedtime. CPAP nightly   Vitamin D, Ergocalciferol, (DRISDOL) 1.25 MG (50000 UNIT) CAPS capsule Take 50,000 Units by mouth every 7 (seven) days.   No facility-administered encounter medications on file as of 02/06/2023.    Allergies (verified) Gabapentin and Topiramate   History: Past Medical History:  Diagnosis Date   Hyperlipidemia    Hypertension    controlled on meds   Sleep apnea    CPAP   Tremors of nervous system    Right upper extremity   West Nile Virus infection 2008   pain in knee-thigh in right leg   Past Surgical History:  Procedure Laterality Date   COLONOSCOPY WITH PROPOFOL N/A 10/10/2019   Procedure: COLONOSCOPY WITH BIOPSY;  Surgeon: Midge Minium, MD;  Location: [facility];  Service: Endoscopy;  Laterality: N/A;  priority 3   HAND SURGERY Bilateral 2013   carpal tunnel both hands   POLYPECTOMY N/A 10/10/2019   Procedure: POLYPECTOMY;  Surgeon: Midge Minium, MD;  Location: [facility];  Service: Endoscopy;  Laterality: N/A;   ROTATOR CUFF REPAIR Bilateral 2013   Family History  Problem Relation Age of Onset  Cancer Father        prostate cancer   Diabetes Father    Heart attack Father    Cancer Mother        breast cancer   Diabetes Sister    Social History   Socioeconomic History   Marital status: Married    Spouse name: Ryon Fay   Number of children: 2   Years of education: Not on file   Highest education level: High school graduate  Occupational History   Occupation: parent    Comment: on SS Disability  Tobacco Use   Smoking status: Never   Smokeless tobacco: Never  Vaping Use   Vaping  status: Never Used  Substance and Sexual Activity   Alcohol use: No   Drug use: No   Sexual activity: Yes    Birth control/protection: None  Other Topics Concern   Not on file  Social History Narrative   Not on file   Social Determinants of Health   Financial Resource Strain: Low Risk  (02/06/2023)   Overall Financial Resource Strain (CARDIA)    Difficulty of Paying Living Expenses: Not hard at all  Food Insecurity: No Food Insecurity (02/06/2023)   Hunger Vital Sign    Worried About Running Out of Food in the Last Year: Never true    Ran Out of Food in the Last Year: Never true  Transportation Needs: No Transportation Needs (02/06/2023)   PRAPARE - Administrator, Civil Service (Medical): No    Lack of Transportation (Non-Medical): No  Physical Activity: Insufficiently Active (02/06/2023)   Exercise Vital Sign    Days of Exercise per Week: 3 days    Minutes of Exercise per Session: 30 min  Stress: No Stress Concern Present (02/06/2023)   [employer] of Occupational Health - Occupational Stress Questionnaire    Feeling of Stress : Not at all  Social Connections: Moderately Isolated (02/06/2023)   Social Connection and Isolation Panel [NHANES]    Frequency of Communication with Friends and Family: More than three times a week    Frequency of Social Gatherings with Friends and Family: Never    Attends Religious Services: Never    Diplomatic Services operational officer: No    Attends Engineer, structural: Never    Marital Status: Married    Tobacco Counseling Counseling given: Not Answered   Clinical Intake:  Pre-visit preparation completed: Yes  Pain : No/denies pain     Nutritional Status: BMI > 30  Obese Nutritional Risks: None Diabetes: No  How often do you need to have someone help you when you read instructions, pamphlets, or other written materials from your doctor or pharmacy?: 1 - Never  Interpreter Needed?: No  Information  entered by :: Kennedy Bucker, LPN   Activities of Daily Living    02/06/2023   10:35 AM  In your present state of health, do you have any difficulty performing the following activities:  Hearing? 0  Vision? 0  Difficulty concentrating or making decisions? 0  Walking or climbing stairs? 0  Dressing or bathing? 0  Doing errands, shopping? 0  Preparing Food and eating ? N  Using the Toilet? N  In the past six months, have you accidently leaked urine? N  Do you have problems with loss of bowel control? N  Managing your Medications? N  Managing your Finances? N  Housekeeping or managing your Housekeeping? N    Patient Care Team: Reubin Milan, MD as  PCP - General (Family Medicine) Smithson, Letta Moynahan, MD (Neurology) Lauretta Grill, NP as Nurse Practitioner (Orthopedic Surgery) [facility], [facility])  Indicate any recent Medical Services you may have received from other than [facility] providers in the past year (date may be approximate).     Assessment:   This is a routine wellness examination for [place].  Hearing/Vision screen Hearing Screening - Comments:: No aids Vision Screening - Comments:: Wears glasses- [facility]   Goals Addressed             This Visit's Progress    DIET - EAT MORE FRUITS AND VEGETABLES         Depression Screen    02/06/2023   10:33 AM 05/10/2022    2:28 PM 02/03/2022    8:19 AM 07/19/2021   11:11 AM 04/12/2021    3:36 PM 02/01/2021    9:04 AM 01/26/2021    8:49 AM  PHQ 2/9 Scores  PHQ - 2 Score 0 0 0 1 1 2  0  PHQ- 9 Score 0 3  2 2 4      Fall Risk    02/06/2023   10:35 AM 05/10/2022    2:28 PM 02/03/2022    8:20 AM 07/19/2021   11:12 AM 04/12/2021    3:36 PM  Fall Risk   Falls in the past year? 0 0 0 1 0  Number falls in past yr: 0 0 0 1 0  Comment    6+   Injury with Fall? 0 0 0 0 0  Risk for fall due to : No Fall Risks No Fall Risks No Fall Risks History of fall(s);Impaired balance/gait No Fall Risks  Follow  up Falls prevention discussed;Falls evaluation completed Falls evaluation completed Falls evaluation completed Falls evaluation completed Falls evaluation completed    MEDICARE RISK AT HOME: Medicare Risk at Home Any stairs in or around the home?: No If so, are there any without handrails?: No Home free of loose throw rugs in walkways, pet beds, electrical cords, etc?: Yes Adequate lighting in your home to reduce risk of falls?: Yes Life alert?: No Use of a cane, walker or w/c?: No Grab bars in the bathroom?: Yes Shower chair or bench in shower?: No Elevated toilet seat or a handicapped toilet?: No  TIMED UP AND GO:  Was the test performed?  No    Cognitive Function:        02/06/2023   10:36 AM 02/03/2022    8:20 AM  6CIT Screen  What Year? 0 points 0 points  What month? 0 points 0 points  What time? 3 points 0 points  Count back from 20 2 points 0 points  Months in reverse 4 points 0 points  Repeat phrase 0 points 8 points  Total Score 9 points 8 points    Immunizations Immunization History  Administered Date(s) Administered   Influenza,inj,Quad PF,6+ Mos 03/20/2016, 03/05/2017, 04/10/2018, 02/01/2021, 05/10/2022   Influenza-Unspecified 02/20/2015   Tdap 06/30/2015    TDAP status: Up to date  Flu Vaccine status: Up to date  Pneumococcal vaccine status: Declined,  Education has been provided regarding the importance of this vaccine but patient still declined. Advised may receive this vaccine at local pharmacy or Health Dept. Aware to provide a copy of the vaccination record if obtained from local pharmacy or Health Dept. Verbalized acceptance and understanding.   Covid-19 vaccine status: Declined, Education has been provided regarding the importance of this vaccine but patient still declined. Advised  may receive this vaccine at local pharmacy or Health Dept.or vaccine clinic. Aware to provide a copy of the vaccination record if obtained from local pharmacy or Health  Dept. Verbalized acceptance and understanding.  Qualifies for Shingles Vaccine? Yes   Zostavax completed No   Shingrix Completed?: No.    Education has been provided regarding the importance of this vaccine. Patient has been advised to call insurance company to determine out of pocket expense if they have not yet received this vaccine. Advised may also receive vaccine at local pharmacy or Health Dept. Verbalized acceptance and understanding.  Screening Tests Health Maintenance  Topic Date Due   COVID-19 Vaccine (1) Never done   Zoster Vaccines- Shingrix (1 of 2) Never done   INFLUENZA VACCINE  12/21/2022   Medicare Annual Wellness (AWV)  02/06/2024   Colonoscopy  10/09/2024   DTaP/Tdap/Td (2 - Td or Tdap) 06/29/2025   Hepatitis C Screening  Completed   HIV Screening  Completed   Pneumococcal Vaccine 25-81 Years old  Aged Out   HPV VACCINES  Aged Out   HEMOGLOBIN A1C  Discontinued    Health Maintenance  Health Maintenance Due  Topic Date Due   COVID-19 Vaccine (1) Never done   Zoster Vaccines- Shingrix (1 of 2) Never done   INFLUENZA VACCINE  12/21/2022    Colorectal cancer screening: Type of screening: Colonoscopy. Completed 10/10/19. Repeat every 5 years  Lung Cancer Screening: (Low Dose CT Chest recommended if Age 37-80 years, 20 pack-year currently smoking OR have quit w/in 15years.) does not qualify.    Additional Screening:  Hepatitis C Screening: does qualify; Completed 05/19/21  Vision Screening: Recommended annual ophthalmology exams for early detection of glaucoma and other disorders of the eye. Is the patient up to date with their annual eye exam?  Yes  Who is the provider or what is the name of the office in which the patient attends annual eye exams? [facility] If pt is not established with a provider, would they like to be referred to a provider to establish care? No .   Dental Screening: Recommended annual dental exams for proper oral hygiene   Community  Resource Referral / Chronic Care Management: CRR required this visit?  No   CCM required this visit?  No     Plan:     I have personally reviewed and noted the following in the patient's chart:   Medical and social history Use of alcohol, tobacco or illicit drugs  Current medications and supplements including opioid prescriptions. Patient is not currently taking opioid prescriptions. Functional ability and status Nutritional status Physical activity Advanced directives List of other physicians Hospitalizations, surgeries, and ER visits in previous 12 months Vitals Screenings to include cognitive, depression, and falls Referrals and appointments  In addition, I have reviewed and discussed with patient certain preventive protocols, quality metrics, and best practice recommendations. A written personalized care plan for preventive services as well as general preventive health recommendations were provided to patient.     Hal Hope, LPN   1/[id number]/[id number]   After Visit Summary: (MyChart) Due to this being a telephonic visit, the after visit summary with patients personalized plan was offered to patient via MyChart   Nurse Notes: none

## 2023-02-12 ENCOUNTER — Encounter: Payer: Self-pay | Admitting: Internal Medicine

## 2023-02-12 ENCOUNTER — Ambulatory Visit (INDEPENDENT_AMBULATORY_CARE_PROVIDER_SITE_OTHER): Payer: Medicare Other | Admitting: Internal Medicine

## 2023-02-12 VITALS — BP 118/76 | HR 85 | Ht 66.0 in | Wt 232.2 lb

## 2023-02-12 DIAGNOSIS — Z23 Encounter for immunization: Secondary | ICD-10-CM | POA: Diagnosis not present

## 2023-02-12 DIAGNOSIS — Z91018 Allergy to other foods: Secondary | ICD-10-CM | POA: Insufficient documentation

## 2023-02-12 DIAGNOSIS — I10 Essential (primary) hypertension: Secondary | ICD-10-CM

## 2023-02-12 NOTE — Assessment & Plan Note (Signed)
Normal exam with stable BP on amlodipine, valsartan and metoprolol. No concerns or side effects to current medication. No change in regimen; continue low sodium diet.

## 2023-02-12 NOTE — Progress Notes (Signed)
Date:  02/12/2023   Name:  Jay Ford   DOB:  11/01/1960   MRN:  [id number]   Chief Complaint: Abdominal Pain (X 3 months patient cannot eat meat otherwise he will get vomit and diarrhea. )  Abdominal Pain This is a recurrent problem. The problem occurs intermittently. The problem has been unchanged. The pain is located in the generalized abdominal region. The pain is moderate. The quality of the pain is cramping and a sensation of fullness. Associated symptoms include diarrhea, nausea and vomiting. Pertinent negatives include no fever, hematochezia or melena. Exacerbated by: beef, duck and chicken. Treatments tried: avoiding food triggers.    Lab Results  Component Value Date   NA 138 08/11/2021   K 4.2 08/11/2021   CO2 24 (A) 08/11/2021   GLUCOSE 104 (H) 08/06/2020   BUN 16 08/11/2021   CREATININE 0.9 08/11/2021   CALCIUM 9.4 08/11/2021   EGFR 86 08/11/2021   GFRNONAA >60 08/06/2020   Lab Results  Component Value Date   CHOL 198 08/06/2020   HDL 48 08/06/2020   LDLCALC 121 (H) 08/06/2020   TRIG 146 08/06/2020   CHOLHDL 4.1 08/06/2020   Lab Results  Component Value Date   TSH 1.83 08/11/2021   Lab Results  Component Value Date   HGBA1C 5.6 05/10/2022   Lab Results  Component Value Date   WBC 5.2 08/06/2020   HGB 14.5 08/06/2020   HCT 44.2 08/06/2020   MCV 91.5 08/06/2020   PLT 192 08/06/2020   Lab Results  Component Value Date   ALT 26 08/11/2021   AST 23 08/11/2021   ALKPHOS 77 08/11/2021   BILITOT 0.7 08/06/2020   Lab Results  Component Value Date   VD25OH 36.8 10/19/2021     Review of Systems  Constitutional:  Negative for chills, fatigue and fever.  Respiratory:  Negative for chest tightness and shortness of breath.   Cardiovascular:  Negative for chest pain and palpitations.  Gastrointestinal:  Positive for abdominal pain, diarrhea, nausea and vomiting. Negative for hematochezia and melena.    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   Obesity, Class I, BMI 30-34.9 06/30/2015   Plantar fasciitis 10/21/2014   Essential hypertension 02/13/2014   Meralgia paresthetica of both lower extremities 02/13/2014   Palpitations 10/09/2011   Hyperlipidemia    MI (mitral incompetence) 02/07/2011   OSA (obstructive sleep apnea) 02/07/2011   Hx of colonic polyp 02/07/2011   Testosterone deficiency 02/07/2011   Tubular adenoma 02/07/2011    Allergies  Allergen Reactions   Gabapentin Other (See Comments)    Is not right, made see things  Other Reaction(s): Hallucinations, Other (See Comments)  Is not right    Other Reaction(s): Other (See Comments)    Is not right, made see things    Is not right Is not right, made see things   Topiramate Rash    Blistered rash on face    Past Surgical History:  Procedure Laterality Date   COLONOSCOPY WITH PROPOFOL N/A 10/10/2019   Procedure: COLONOSCOPY WITH BIOPSY;  Surgeon: Servando Snare,  Darren, MD;  Location: [facility];  Service: Endoscopy;  Laterality: N/A;  priority 3   HAND SURGERY Bilateral 2013   carpal tunnel both hands   POLYPECTOMY N/A 10/10/2019   Procedure: POLYPECTOMY;  Surgeon: Midge Minium, MD;  Location[facility];  Service: Endoscopy;  Laterality: N/A;   ROTATOR CUFF REPAIR Bilateral 2013    Social History   Tobacco Use   Smoking status: Never   Smokeless tobacco: Never  Vaping Use   Vaping status: Never Used  Substance Use Topics   Alcohol use: No   Drug use: No     Medication list has been reviewed and updated.  Current  Meds  Medication Sig   amLODipine-valsartan (EXFORGE) 5-160 MG tablet Take 1 tablet by mouth daily.   aspirin EC 81 MG tablet Take 1 tablet by mouth daily.   atorvastatin (LIPITOR) 40 MG tablet Take 1 tablet by mouth daily.   CANNABIDIOL PO Take by mouth. Using daily for pain   cyanocobalamin (,VITAMIN B-12,) 1000 MCG/ML injection Inject 1 mL (1,000 mcg total) into the muscle every 30 (thirty) days.   DULoxetine (CYMBALTA) 30 MG capsule TAKE 3 CAPSULES BY MOUTH EVERY DAY   metoprolol succinate (TOPROL-XL) 25 MG 24 hr tablet TAKE 1 TABLET (25 MG TOTAL) BY MOUTH AT BEDTIME. TAKE WITH OR IMMEDIATELY FOLLOWING A MEAL.   Multiple Vitamin (MULTIVITAMIN) LIQD Take 5 mLs by mouth daily.   nitroGLYCERIN (NITROSTAT) 0.4 MG SL tablet Place 0.4 mg under the tongue every 5 (five) minutes as needed for chest pain.   NON FORMULARY at bedtime. CPAP nightly   Omega-3 Fatty Acids (OMEGA 3 500 PO) Take by mouth.   Probiotic Product (ALOE 09604 & PROBIOTICS PO) Take by mouth.   Vitamin D, Ergocalciferol, (DRISDOL) 1.25 MG (50000 UNIT) CAPS capsule Take 50,000 Units by mouth every 7 (seven) days.       02/12/2023    3:33 PM 05/10/2022    2:28 PM 07/19/2021   11:11 AM 04/12/2021    3:36 PM  GAD 7 : Generalized Anxiety Score  Nervous, Anxious, on Edge 0 0 0 0  Control/stop worrying 0 0 0 0  Worry too much - different things 0 0 0 0  Trouble relaxing 0 0 0 0  Restless 0 0 0 0  Easily annoyed or irritable 0 0 0 0  Afraid - awful might happen 0 0 0 0  Total GAD 7 Score 0 0 0 0  Anxiety Difficulty Not difficult at all Not difficult at all Not difficult at all Not difficult at all       02/12/2023    3:32 PM 02/06/2023   10:33 AM 05/10/2022    2:28 PM  Depression screen PHQ 2/9  Decreased Interest 0 0 0  Down, Depressed, Hopeless 0 0 0  PHQ - 2 Score 0 0 0  Altered sleeping 0 0 0  Tired, decreased energy 1 0 2  Change in appetite 0 0 0  Feeling bad or failure about yourself  0 0 0  Trouble  concentrating 0 0 1  Moving slowly or fidgety/restless 0 0 0  Suicidal thoughts 0 0 0  PHQ-9 Score 1 0 3  Difficult doing work/chores Not difficult at all Not difficult at all Somewhat difficult    BP Readings from Last 3 Encounters:  02/12/23 118/76  02/05/23 127/75  11/07/22 (!) 146/72    Physical Exam Vitals and nursing note reviewed.  Constitutional:  General: He is not in acute distress.    Appearance: He is well-developed.  HENT:     Head: Normocephalic and atraumatic.  Cardiovascular:     Rate and Rhythm: Normal rate and regular rhythm.  Pulmonary:     Effort: Pulmonary effort is normal. No respiratory distress.  Abdominal:     General: Abdomen is protuberant. Bowel sounds are normal.     Palpations: Abdomen is soft.     Tenderness: There is no abdominal tenderness.  Skin:    General: Skin is warm and dry.     Findings: No rash.  Neurological:     Mental Status: He is alert and oriented to person, place, and time.  Psychiatric:        Mood and Affect: Mood normal.        Behavior: Behavior normal.     Wt Readings from Last 3 Encounters:  02/12/23 232 lb 3.2 oz (105.3 kg)  02/05/23 230 lb (104.3 kg)  11/07/22 232 lb 9.6 oz (105.5 kg)    BP 118/76 (BP Location: Right Arm, Cuff Size: Large)   Pulse 85   Ht 5\' 6"  (1.676 m)   Wt 232 lb 3.2 oz (105.3 kg)   SpO2 97%   BMI 37.48 kg/m   Assessment and Plan:  Problem List Items Addressed This Visit       Unprioritized   Essential hypertension (Chronic)    Normal exam with stable BP on amlodipine, valsartan and metoprolol. No concerns or side effects to current medication. No change in regimen; continue low sodium diet.       Allergy to meat - Primary   Relevant Orders   Alpha-Gal Panel   Allergy Panel, Animal Group   Other Visit Diagnoses     Need for influenza vaccination       Relevant Orders   Flu vaccine trivalent PF, 6mos and older(Flulaval,Afluria,Fluarix,Fluzone) (Completed)        Return in about 4 months (around 06/14/2023) for CPX.    Reubin Milan, MD [facility] [place]

## 2023-02-14 ENCOUNTER — Telehealth: Payer: Self-pay | Admitting: Internal Medicine

## 2023-02-14 LAB — ALLERGY PANEL, ANIMAL GROUP
Chicken Feathers IgE: <0.1 kU/L
Cow Dander IgE: <0.1 kU/L
Goose Feathers IgE: <0.1 kU/L
Horse dander: <0.1 kU/L
Mouse Urine IgE: <0.1 kU/L

## 2023-02-14 LAB — ALPHA-GAL PANEL
Allergen Lamb IgE: <0.1 kU/L
Beef IgE: <0.1 kU/L
IgE (Immunoglobulin E), Serum: 127 IU/mL (ref 6–495)
O215-IgE Alpha-Gal: <0.1 kU/L
Pork IgE: <0.1 kU/L

## 2023-02-14 NOTE — Telephone Encounter (Signed)
Copied from CRM [id number]. Topic: General - Other >> Feb 14, 2023 11:13 AM Macon Large wrote: Reason for CRM: pt spouse requests that pt be contacted to go over most recent lab results. Cb# [phone]

## 2023-02-15 ENCOUNTER — Other Ambulatory Visit: Payer: Self-pay

## 2023-02-15 NOTE — Progress Notes (Signed)
PC to pt, spoke with pt wife, voiced understanding.

## 2023-02-22 ENCOUNTER — Other Ambulatory Visit: Payer: Self-pay | Admitting: Internal Medicine

## 2023-02-22 DIAGNOSIS — A9232 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: Secondary | ICD-10-CM

## 2023-02-22 DIAGNOSIS — I10 Essential (primary) hypertension: Secondary | ICD-10-CM

## 2023-02-23 NOTE — Telephone Encounter (Signed)
Requested medication (s) are due for refill today:   Yes for both  Requested medication (s) are on the active medication list:   Yes for both  Future visit scheduled:   Yes 07/19/2023   Last ordered: Both 11/19/2022 90 days supply with 1 refill   Unable to refill because Cr. And eGFR are due per protocol.     Requested Prescriptions  Pending Prescriptions Disp Refills   metoprolol succinate (TOPROL-XL) 25 MG 24 hr tablet [Pharmacy Med Name: METOPROLOL SUCC ER 25 MG TAB] 90 tablet 1    Sig: TAKE 1 TABLET (25 MG TOTAL) BY MOUTH AT BEDTIME. TAKE WITH OR IMMEDIATELY FOLLOWING A MEAL.     Cardiovascular:  Beta Blockers Passed - 02/22/2023  5:42 PM      Passed - Last BP in normal range    BP Readings from Last 1 Encounters:  02/12/23 118/76         Passed - Last Heart Rate in normal range    Pulse Readings from Last 1 Encounters:  02/12/23 85         Passed - Valid encounter within last 6 months    Recent Outpatient Visits           1 week ago Allergy to meat   [place] [facility] at [facility], Nyoka Cowden, MD   9 months ago Essential hypertension   [place] [facility] at [facility], Nyoka Cowden, MD   1 year ago Bronchitis   [place] [facility] at [facility], Nyoka Cowden, MD   1 year ago Acute cough   [place] [facility] at [facility], Nyoka Cowden, MD   1 year ago Radiculopathy with lower extremity symptoms   [place] [facility] at [facility], Nyoka Cowden, MD       Future Appointments             In 4 months Reubin Milan, MD [facility] at [facility], [facility]             DULoxetine (CYMBALTA) 30 MG capsule [Pharmacy Med Name: DULOXETINE HCL DR 30 MG CAP] 270 capsule 1    Sig: TAKE 3 CAPSULES BY MOUTH EVERY DAY     Psychiatry: Antidepressants - SNRI - duloxetine  Failed - 02/22/2023  5:42 PM      Failed - Cr in normal range and within 360 days    Creatinine  Date Value Ref Range Status  08/11/2021 0.9 0.6 - 1.3 Final   Creatinine, Ser  Date Value Ref Range Status  08/06/2020 0.99 0.61 - 1.24 mg/dL Final         Failed - eGFR is 30 or above and within 360 days    GFR calc Af Amer  Date Value Ref Range Status  09/08/2019 91 >59 mL/min/1.73 Final   GFR, Estimated  Date Value Ref Range Status  08/06/2020 >60 >60 mL/min Final    Comment:    (NOTE) Calculated using the CKD-EPI Creatinine Equation (2021)    eGFR  Date Value Ref Range Status  08/11/2021 86  Final         Passed - Completed PHQ-2 or PHQ-9 in the last 360 days      Passed - Last BP in normal range    BP Readings from Last 1 Encounters:  02/12/23  118/76         Passed - Valid encounter within last 6 months    Recent Outpatient Visits           1 week ago Allergy to meat   [facility] at [facility], Nyoka Cowden, MD   9 months ago Essential hypertension   [place] [facility] at [facility], Nyoka Cowden, MD   1 year ago Bronchitis   [facility] at [facility], Nyoka Cowden, MD   1 year ago Acute cough   [place] [facility] at [facility], Nyoka Cowden, MD   1 year ago Radiculopathy with lower extremity symptoms   [facility] at [facility], Nyoka Cowden, MD       Future Appointments             In 4 months Judithann Graves, Nyoka Cowden, MD [facility] at [facility], [facility]

## 2023-03-01 DIAGNOSIS — G4733 Obstructive sleep apnea (adult) (pediatric): Secondary | ICD-10-CM | POA: Diagnosis not present

## 2023-03-20 DIAGNOSIS — G4733 Obstructive sleep apnea (adult) (pediatric): Secondary | ICD-10-CM | POA: Diagnosis not present

## 2023-07-19 ENCOUNTER — Ambulatory Visit (INDEPENDENT_AMBULATORY_CARE_PROVIDER_SITE_OTHER): Payer: Medicare Other | Admitting: Internal Medicine

## 2023-07-19 ENCOUNTER — Encounter: Payer: Self-pay | Admitting: Internal Medicine

## 2023-07-19 VITALS — BP 112/76 | HR 65 | Ht 66.0 in | Wt 236.2 lb

## 2023-07-19 DIAGNOSIS — G5713 Meralgia paresthetica, bilateral lower limbs: Secondary | ICD-10-CM

## 2023-07-19 DIAGNOSIS — R7303 Prediabetes: Secondary | ICD-10-CM

## 2023-07-19 DIAGNOSIS — A9232 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: Secondary | ICD-10-CM

## 2023-07-19 DIAGNOSIS — Z0000 Encounter for general adult medical examination without abnormal findings: Secondary | ICD-10-CM

## 2023-07-19 DIAGNOSIS — E782 Mixed hyperlipidemia: Secondary | ICD-10-CM

## 2023-07-19 DIAGNOSIS — G4733 Obstructive sleep apnea (adult) (pediatric): Secondary | ICD-10-CM | POA: Diagnosis not present

## 2023-07-19 DIAGNOSIS — Z23 Encounter for immunization: Secondary | ICD-10-CM

## 2023-07-19 DIAGNOSIS — I10 Essential (primary) hypertension: Secondary | ICD-10-CM | POA: Diagnosis not present

## 2023-07-19 DIAGNOSIS — R351 Nocturia: Secondary | ICD-10-CM

## 2023-07-19 MED ORDER — METOPROLOL SUCCINATE ER 25 MG PO TB24
25.0000 mg | ORAL_TABLET | Freq: Every day | ORAL | 1 refills | Status: DC
Start: 2023-07-19 — End: 2024-01-09

## 2023-07-19 MED ORDER — DULOXETINE HCL 30 MG PO CPEP: 90.0000 mg | ORAL_CAPSULE | Freq: Every day | ORAL | 1 refills | Status: DC

## 2023-07-19 NOTE — Assessment & Plan Note (Signed)
 Likely exacerbated by untreated OSA. Encourage him to follow up with Pulmonary asap

## 2023-07-19 NOTE — Assessment & Plan Note (Addendum)
 Recent sleep study done through [facility] but he has not been able to get the equipment He thinks he has a pulmonology appointment soon

## 2023-07-19 NOTE — Assessment & Plan Note (Signed)
 Controlled BP with normal exam. Current regimen is amlodipine, valsartan and metoprolol. Will continue same medications; encourage continued reduced sodium diet.

## 2023-07-19 NOTE — Progress Notes (Signed)
 Date:  07/19/2023   Name:  Jay Ford   DOB:  1961/02/05   MRN:  [id number]   Chief Complaint: Annual Exam Jay Ford is a 63 y.o. male who presents today for his Complete Annual Exam. He feels fairly well. He reports exercising - walking daily, 30 minutes. He reports he is sleeping fairly well.   Health Maintenance  Topic Date Due   COVID-19 Vaccine (1) Never done   Zoster (Shingles) Vaccine (1 of 2) 10/16/2023*   Medicare Annual Wellness Visit  02/06/2024   Colon Cancer Screening  10/09/2024   DTaP/Tdap/Td vaccine (2 - Td or Tdap) 06/29/2025   Pneumococcal Vaccination  Completed   Flu Shot  Completed   Hepatitis C Screening  Completed   HIV Screening  Completed   HPV Vaccine  Aged Out   Hemoglobin A1C  Discontinued  *Topic was postponed. The date shown is not the original due date.    Lab Results  Component Value Date   PSA1 0.5 09/08/2019   PSA1 0.7 10/11/2017    Hypertension This is a chronic problem. The problem is controlled. Pertinent negatives include no chest pain, headaches, palpitations or shortness of breath. Past treatments include angiotensin blockers, calcium channel blockers and beta blockers. The current treatment provides significant improvement. There is no history of kidney disease, CAD/MI or CVA.  OSA - not treated in some time.  Had sleep study with [facility] but has not been able to arrange treatment.    Review of Systems  Constitutional:  Positive for unexpected weight change. Negative for appetite change, chills, diaphoresis and fatigue.  HENT:  Negative for hearing loss, tinnitus, trouble swallowing and voice change.   Eyes:  Negative for visual disturbance.  Respiratory:  Negative for choking, shortness of breath and wheezing.   Cardiovascular:  Negative for chest pain, palpitations and leg swelling.  Gastrointestinal:  Negative for abdominal pain, blood in stool, constipation and diarrhea.  Genitourinary:  Negative for difficulty  urinating, dysuria and frequency.  Musculoskeletal:  Positive for back pain and myalgias. Negative for arthralgias.  Skin:  Negative for color change and rash.  Neurological:  Negative for dizziness, syncope and headaches.  Hematological:  Negative for adenopathy.  Psychiatric/Behavioral:  Negative for dysphoric mood and sleep disturbance. The patient is not nervous/anxious.      Lab Results  Component Value Date   NA 138 08/11/2021   K 4.2 08/11/2021   CO2 24 (A) 08/11/2021   GLUCOSE 104 (H) 08/06/2020   BUN 16 08/11/2021   CREATININE 0.9 08/11/2021   CALCIUM 9.4 08/11/2021   EGFR 86 08/11/2021   GFRNONAA >60 08/06/2020   Lab Results  Component Value Date   CHOL 198 08/06/2020   HDL 48 08/06/2020   LDLCALC 121 (H) 08/06/2020   TRIG 146 08/06/2020   CHOLHDL 4.1 08/06/2020   Lab Results  Component Value Date   TSH 1.83 08/11/2021   Lab Results  Component Value Date   HGBA1C 5.6 05/10/2022   Lab Results  Component Value Date   WBC 5.2 08/06/2020   HGB 14.5 08/06/2020   HCT 44.2 08/06/2020   MCV 91.5 08/06/2020   PLT 192 08/06/2020   Lab Results  Component Value Date   ALT 26 08/11/2021   AST 23 08/11/2021   ALKPHOS 77 08/11/2021   BILITOT 0.7 08/06/2020   Lab Results  Component Value Date   VD25OH 36.8 10/19/2021     Patient Active Problem List  Diagnosis Date Noted   Obesity, morbid (HCC) 07/19/2023   Allergy to meat 02/12/2023   Prediabetes 05/10/2022   B12 deficiency 10/19/2021   Vitamin D deficiency 10/19/2021   Radiculopathy with lower extremity symptoms 07/19/2021   Polyp of descending colon    Nocturia 10/11/2017   Pain medication agreement signed 08/09/2016   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   Obesity, Class I, BMI 30-34.9 06/30/2015   Essential hypertension 02/13/2014   Meralgia paresthetica of both lower extremities 02/13/2014   Palpitations 10/09/2011   Mixed hyperlipidemia    MI (mitral  incompetence) 02/07/2011   OSA (obstructive sleep apnea) 02/07/2011   Hx of colonic polyp 02/07/2011   Testosterone deficiency 02/07/2011   Tubular adenoma 02/07/2011    Allergies  Allergen Reactions   Gabapentin Other (See Comments)    Is not right, made see things  Other Reaction(s): Hallucinations, Other (See Comments)  Is not right    Other Reaction(s): Other (See Comments)    Is not right, made see things    Is not right Is not right, made see things   Topiramate Rash    Blistered rash on face    Past Surgical History:  Procedure Laterality Date   COLONOSCOPY WITH PROPOFOL N/A 10/10/2019   Procedure: COLONOSCOPY WITH BIOPSY;  Surgeon: Midge Minium, MD;  Location: [facility];  Service: Endoscopy;  Laterality: N/A;  priority 3   HAND SURGERY Bilateral 2013   carpal tunnel both hands   POLYPECTOMY N/A 10/10/2019   Procedure: POLYPECTOMY;  Surgeon: Midge Minium, MD;  Location: [facility];  Service: Endoscopy;  Laterality: N/A;   ROTATOR CUFF REPAIR Bilateral 2013    Social History   Tobacco Use   Smoking status: Never   Smokeless tobacco: Never  Vaping Use   Vaping status: Never Used  Substance Use Topics   Alcohol use: No   Drug use: No     Medication list has been reviewed and updated.  Current Meds  Medication Sig   amLODipine-valsartan (EXFORGE) 5-160 MG tablet Take 1 tablet by mouth daily.   aspirin EC 81 MG tablet Take 1 tablet by mouth daily.   atorvastatin (LIPITOR) 40 MG tablet Take 1 tablet by mouth daily.   CANNABIDIOL PO Take by mouth. Using daily for pain   cyanocobalamin (,VITAMIN B-12,) 1000 MCG/ML injection Inject 1 mL (1,000 mcg total) into the muscle every 30 (thirty) days.   Multiple Vitamin (MULTIVITAMIN) LIQD Take 5 mLs by mouth daily.   nitroGLYCERIN (NITROSTAT) 0.4 MG SL tablet Place 0.4 mg under the tongue every 5 (five) minutes as needed for chest pain.   NON FORMULARY at bedtime. CPAP nightly   Omega-3 Fatty Acids  (OMEGA 3 500 PO) Take by mouth.   Probiotic Product (ALOE 40981 & PROBIOTICS PO) Take by mouth.   Vitamin D, Ergocalciferol, (DRISDOL) 1.25 MG (50000 UNIT) CAPS capsule Take 50,000 Units by mouth every 7 (seven) days.   [DISCONTINUED] DULoxetine (CYMBALTA) 30 MG capsule TAKE 3 CAPSULES BY MOUTH EVERY DAY   [DISCONTINUED] metoprolol succinate (TOPROL-XL) 25 MG 24 hr tablet TAKE 1 TABLET (25 MG TOTAL) BY MOUTH AT BEDTIME. TAKE WITH OR IMMEDIATELY FOLLOWING A MEAL.       07/19/2023    8:35 AM 02/12/2023    3:33 PM 05/10/2022    2:28 PM 07/19/2021   11:11 AM  GAD 7 : Generalized Anxiety Score  Nervous, Anxious, on Edge 1 0 0 0  Control/stop worrying 0 0  0 0  Worry too much - different things 0 0 0 0  Trouble relaxing 0 0 0 0  Restless 0 0 0 0  Easily annoyed or irritable 0 0 0 0  Afraid - awful might happen 0 0 0 0  Total GAD 7 Score 1 0 0 0  Anxiety Difficulty Not difficult at all Not difficult at all Not difficult at all Not difficult at all       07/19/2023    8:35 AM 02/12/2023    3:32 PM 02/06/2023   10:33 AM  Depression screen PHQ 2/9  Decreased Interest 2 0 0  Down, Depressed, Hopeless 0 0 0  PHQ - 2 Score 2 0 0  Altered sleeping 0 0 0  Tired, decreased energy 2 1 0  Change in appetite 1 0 0  Feeling bad or failure about yourself  0 0 0  Trouble concentrating 2 0 0  Moving slowly or fidgety/restless 0 0 0  Suicidal thoughts 0 0 0  PHQ-9 Score 7 1 0  Difficult doing work/chores Somewhat difficult Not difficult at all Not difficult at all    BP Readings from Last 3 Encounters:  07/19/23 112/76  02/12/23 118/76  02/05/23 127/75    Physical Exam Vitals and nursing note reviewed.  Constitutional:      Appearance: Normal appearance. He is well-developed.  HENT:     Head: Normocephalic.     Right Ear: Tympanic membrane, ear canal and external ear normal.     Left Ear: Tympanic membrane, ear canal and external ear normal.     Nose: Nose normal.  Eyes:      Conjunctiva/sclera: Conjunctivae normal.     Pupils: Pupils are equal, round, and reactive to light.  Neck:     Thyroid: No thyromegaly.     Vascular: No carotid bruit.  Cardiovascular:     Rate and Rhythm: Normal rate and regular rhythm.     Heart sounds: Normal heart sounds.  Pulmonary:     Effort: Pulmonary effort is normal.     Breath sounds: Normal breath sounds. No wheezing.  Chest:  Breasts:    Right: No mass.     Left: No mass.  Abdominal:     General: Bowel sounds are normal.     Palpations: Abdomen is soft.     Tenderness: There is no abdominal tenderness.  Musculoskeletal:        General: Normal range of motion.     Cervical back: Normal range of motion and neck supple.  Lymphadenopathy:     Cervical: No cervical adenopathy.  Skin:    General: Skin is warm and dry.  Neurological:     Mental Status: He is alert and oriented to person, place, and time.     Deep Tendon Reflexes: Reflexes are normal and symmetric.  Psychiatric:        Attention and Perception: Attention normal.        Mood and Affect: Mood normal.        Thought Content: Thought content normal.     Wt Readings from Last 3 Encounters:  07/19/23 236 lb 4 oz (107.2 kg)  02/12/23 232 lb 3.2 oz (105.3 kg)  02/05/23 230 lb (104.3 kg)    BP 112/76   Pulse 65   Ht 5\' 6"  (1.676 m)   Wt 236 lb 4 oz (107.2 kg)   SpO2 96%   BMI 38.13 kg/m   Assessment and Plan:  Problem List Items Addressed  This Visit       Unprioritized   Essential hypertension (Chronic)   Controlled BP with normal exam. Current regimen is amlodipine, valsartan and metoprolol. Will continue same medications; encourage continued reduced sodium diet.       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)   Relevant Medications   DULoxetine (CYMBALTA) 30 MG capsule   Prediabetes (Chronic)   Relevant Orders   Hemoglobin A1c   Mixed hyperlipidemia   He is not currently taking atorvastatin prescribed in the past Will check lipids and advise      Relevant Medications   metoprolol succinate (TOPROL-XL) 25 MG 24 hr tablet   Other Relevant Orders   Lipid panel   OSA (obstructive sleep apnea)   Recent sleep study done through [facility] but he has not been able to get the equipment He thinks he has a pulmonology appointment soon      Relevant Orders   CBC with Differential/Platelet   Nocturia   Relevant Orders   PSA   Obesity, morbid (HCC)   Likely exacerbated by untreated OSA. Encourage him to follow up with Pulmonary asap      Other Visit Diagnoses       Annual physical exam    -  Primary   continue dietary efforts and exercise as able up to date on screenings and immunizations     Immunization due       Relevant Orders   Pneumococcal conjugate vaccine 20-valent (Completed)       Return in about 6 months (around 01/16/2024) for HTN.    Reubin Milan, MD [facility] [place]

## 2023-07-19 NOTE — Assessment & Plan Note (Signed)
 Chronic pain - on Cymbalta and using CBD producted

## 2023-07-19 NOTE — Assessment & Plan Note (Signed)
 He is not currently taking atorvastatin prescribed in the past Will check lipids and advise

## 2023-07-20 LAB — LIPID PANEL
Chol/HDL Ratio: 4.6 ratio (ref 0.0–5.0)
Cholesterol, Total: 226 mg/dL — ABNORMAL HIGH (ref 100–199)
HDL: 49 mg/dL (ref >39)
LDL Chol Calc (NIH): 149 mg/dL — ABNORMAL HIGH (ref 0–99)
Triglycerides: 157 mg/dL — ABNORMAL HIGH (ref 0–149)
VLDL Cholesterol Cal: 28 mg/dL (ref 5–40)

## 2023-07-20 LAB — MICROSCOPIC EXAMINATION
Bacteria, UA: NONE SEEN
Casts: NONE SEEN /LPF
Epithelial Cells (non renal): NONE SEEN /HPF (ref 0–10)

## 2023-07-20 LAB — CBC WITH DIFFERENTIAL/PLATELET
Basophils Absolute: 0.1 10*3/uL (ref 0.0–0.2)
Basos: 1 %
EOS (ABSOLUTE): 0.1 10*3/uL (ref 0.0–0.4)
Eos: 2 %
Hematocrit: 43 % (ref 37.5–51.0)
Hemoglobin: 14.3 g/dL (ref 13.0–17.7)
Immature Grans (Abs): 0 10*3/uL (ref 0.0–0.1)
Immature Granulocytes: 0 %
Lymphocytes Absolute: 2.3 10*3/uL (ref 0.7–3.1)
Lymphs: 32 %
MCH: 30.9 pg (ref 26.6–33.0)
MCHC: 33.3 g/dL (ref 31.5–35.7)
MCV: 93 fL (ref 79–97)
Monocytes Absolute: 0.7 10*3/uL (ref 0.1–0.9)
Monocytes: 10 %
Neutrophils Absolute: 4 10*3/uL (ref 1.4–7.0)
Neutrophils: 55 %
Platelets: 230 10*3/uL (ref 150–450)
RBC: 4.63 x10E6/uL (ref 4.14–5.80)
RDW: 12 % (ref 11.6–15.4)
WBC: 7.2 10*3/uL (ref 3.4–10.8)

## 2023-07-20 LAB — URINALYSIS, ROUTINE W REFLEX MICROSCOPIC
Bilirubin, UA: NEGATIVE
Glucose, UA: NEGATIVE
Leukocytes,UA: NEGATIVE
Nitrite, UA: NEGATIVE
RBC, UA: NEGATIVE
Specific Gravity, UA: 1.024 (ref 1.005–1.030)
Urobilinogen, Ur: 1 mg/dL (ref 0.2–1.0)
pH, UA: 6 (ref 5.0–7.5)

## 2023-07-20 LAB — HEMOGLOBIN A1C
Est. average glucose Bld gHb Est-mCnc: 128 mg/dL
Hgb A1c MFr Bld: 6.1 % — ABNORMAL HIGH (ref 4.8–5.6)

## 2023-07-20 LAB — COMPREHENSIVE METABOLIC PANEL
ALT: 34 IU/L (ref 0–44)
AST: 36 IU/L (ref 0–40)
Albumin: 4.5 g/dL (ref 3.9–4.9)
Alkaline Phosphatase: 109 IU/L (ref 44–121)
BUN/Creatinine Ratio: 11 (ref 10–24)
BUN: 11 mg/dL (ref 8–27)
Bilirubin Total: 0.2 mg/dL (ref 0.0–1.2)
CO2: 22 mmol/L (ref 20–29)
Calcium: 9.5 mg/dL (ref 8.6–10.2)
Chloride: 103 mmol/L (ref 96–106)
Creatinine, Ser: 1.02 mg/dL (ref 0.76–1.27)
Globulin, Total: 2.7 g/dL (ref 1.5–4.5)
Glucose: 105 mg/dL — ABNORMAL HIGH (ref 70–99)
Potassium: 4.8 mmol/L (ref 3.5–5.2)
Sodium: 139 mmol/L (ref 134–144)
Total Protein: 7.2 g/dL (ref 6.0–8.5)
eGFR: 83 mL/min/{1.73_m2} (ref >59)

## 2023-07-20 LAB — PSA: Prostate Specific Ag, Serum: 0.5 ng/mL (ref 0.0–4.0)

## 2023-07-22 ENCOUNTER — Other Ambulatory Visit: Payer: Self-pay | Admitting: Internal Medicine

## 2023-07-22 ENCOUNTER — Encounter: Payer: Self-pay | Admitting: Internal Medicine

## 2023-07-22 DIAGNOSIS — E782 Mixed hyperlipidemia: Secondary | ICD-10-CM

## 2023-07-22 MED ORDER — ATORVASTATIN CALCIUM 40 MG PO TABS: 40.0000 mg | ORAL_TABLET | Freq: Every day | ORAL | 1 refills | Status: DC

## 2023-07-30 DIAGNOSIS — H04123 Dry eye syndrome of bilateral lacrimal glands: Secondary | ICD-10-CM | POA: Diagnosis not present

## 2023-07-30 DIAGNOSIS — H25042 Posterior subcapsular polar age-related cataract, left eye: Secondary | ICD-10-CM | POA: Diagnosis not present

## 2023-07-30 DIAGNOSIS — H31001 Unspecified chorioretinal scars, right eye: Secondary | ICD-10-CM | POA: Diagnosis not present

## 2023-07-30 DIAGNOSIS — H2513 Age-related nuclear cataract, bilateral: Secondary | ICD-10-CM | POA: Diagnosis not present

## 2023-08-01 ENCOUNTER — Encounter: Payer: Self-pay | Admitting: Ophthalmology

## 2023-08-01 ENCOUNTER — Other Ambulatory Visit: Payer: Self-pay

## 2023-08-02 ENCOUNTER — Encounter: Payer: Self-pay | Admitting: Ophthalmology

## 2023-08-02 ENCOUNTER — Ambulatory Visit: Admitting: Student

## 2023-08-02 DIAGNOSIS — G4733 Obstructive sleep apnea (adult) (pediatric): Secondary | ICD-10-CM | POA: Diagnosis not present

## 2023-08-02 NOTE — Anesthesia Preprocedure Evaluation (Addendum)
 Anesthesia Evaluation  Patient identified by MRN, date of birth, ID band Patient awake    Reviewed: Allergy & Precautions, H&P , NPO status , Patient's Chart, lab work & pertinent test results  Airway Mallampati: III  TM Distance: <3 FB Neck ROM: Full    Dental  (+) Chipped   Pulmonary neg pulmonary ROS, sleep apnea  02-05-23 office notes Jay Ford presents with a 30 year history of sleep apnea. He was previously on PAP therapy but has not used his machine for years.  He got away from using it due to not having anyone to service the machine.  This is worse in the supine position.   Sleep quality is average. This is noted most nights. The patient's bed partner reports  loud snoring and witnessed apnea at night. The patient relates the following symptoms: loud snoring, daytime sleepiness, and brain fog are also present. The patient goes to sleep at 9:30 pm and wakes up at 7 am.   Sleep quality is same when outside home environment.  Patient has noted restlessness of his legs at night.  The patient  relates no unusual behavior during the night.  The patient denies a history of psychiatric problems. The Epworth Sleepiness Score is 12 out of 24 .  The patient relates  Cardiovascular risk factors include: hypertension.     Pulmonary exam normal breath sounds clear to auscultation       Cardiovascular hypertension, negative cardio ROS Normal cardiovascular exam Rhythm:Regular Rate:Normal     Neuro/Psych  Neuromuscular disease negative neurological ROS  negative psych ROS   GI/Hepatic negative GI ROS, Neg liver ROS,,,  Endo/Other  negative endocrine ROS    Renal/GU negative Renal ROS  negative genitourinary   Musculoskeletal negative musculoskeletal ROS (+)    Abdominal   Peds negative pediatric ROS (+)  Hematology negative hematology ROS (+)   Anesthesia Other Findings Hyperlipidemia  Tremors of nervous system West Nile Virus  infection  Hypertension Sleep apnea  Remote history of TIA  Has tremors when he gets nervous, has RLS at night, has OSA untreated, has not used CPAP in years.   Reproductive/Obstetrics negative OB ROS                              Anesthesia Physical Anesthesia Plan  ASA: 3  Anesthesia Plan: MAC   Post-op Pain Management:    Induction: Intravenous  PONV Risk Score and Plan:   Airway Management Planned: Natural Airway and Nasal Cannula  Additional Equipment:   Intra-op Plan:   Post-operative Plan:   Informed Consent: I have reviewed the patients History and Physical, chart, labs and discussed the procedure including the risks, benefits and alternatives for the proposed anesthesia with the patient or authorized representative who has indicated his/her understanding and acceptance.     Dental Advisory Given  Plan Discussed with: Anesthesiologist, CRNA and Surgeon  Anesthesia Plan Comments: (Patient consented for risks of anesthesia including but not limited to:  - adverse reactions to medications - damage to eyes, teeth, lips or other oral mucosa - nerve damage due to positioning  - sore throat or hoarseness - Damage to heart, brain, nerves, lungs, other parts of body or loss of life  Patient voiced understanding and assent.)         Anesthesia Quick Evaluation

## 2023-08-08 DIAGNOSIS — H25042 Posterior subcapsular polar age-related cataract, left eye: Secondary | ICD-10-CM | POA: Diagnosis not present

## 2023-08-08 DIAGNOSIS — H2512 Age-related nuclear cataract, left eye: Secondary | ICD-10-CM | POA: Diagnosis not present

## 2023-08-14 NOTE — Discharge Instructions (Signed)

## 2023-08-16 ENCOUNTER — Ambulatory Visit: Payer: Self-pay | Admitting: Anesthesiology

## 2023-08-16 ENCOUNTER — Encounter
Admission: RE | Disposition: A | Discharge status: Home / Self Care | Payer: Self-pay | Source: Home / Self Care | Attending: Ophthalmology

## 2023-08-16 ENCOUNTER — Encounter: Payer: Self-pay | Admitting: Ophthalmology

## 2023-08-16 ENCOUNTER — Other Ambulatory Visit: Payer: Self-pay

## 2023-08-16 ENCOUNTER — Ambulatory Visit
Admission: RE | Admit: 2023-08-16 | Discharge: 2023-08-16 | Disposition: A | Discharge status: Home / Self Care | Attending: Ophthalmology | Admitting: Ophthalmology

## 2023-08-16 DIAGNOSIS — H25042 Posterior subcapsular polar age-related cataract, left eye: Secondary | ICD-10-CM | POA: Diagnosis not present

## 2023-08-16 DIAGNOSIS — H2512 Age-related nuclear cataract, left eye: Secondary | ICD-10-CM | POA: Insufficient documentation

## 2023-08-16 DIAGNOSIS — G4730 Sleep apnea, unspecified: Secondary | ICD-10-CM | POA: Insufficient documentation

## 2023-08-16 DIAGNOSIS — I10 Essential (primary) hypertension: Secondary | ICD-10-CM | POA: Diagnosis not present

## 2023-08-16 HISTORY — PX: CATARACT EXTRACTION W/PHACO: SHX586

## 2023-08-16 HISTORY — DX: Personal history of transient ischemic attack (TIA), and cerebral infarction without residual deficits: Z86.73

## 2023-08-16 SURGERY — PHACOEMULSIFICATION, CATARACT, WITH IOL INSERTION
Anesthesia: Monitor Anesthesia Care | Laterality: Left

## 2023-08-16 MED ORDER — MIDAZOLAM HCL 2 MG/2ML IJ SOLN
INTRAMUSCULAR | Status: AC
Start: 2023-08-16 — End: ?
  Filled 2023-08-16: qty 2

## 2023-08-16 MED ORDER — ARMC OPHTHALMIC DILATING DROPS
OPHTHALMIC | Status: AC
  Filled 2023-08-16: qty 0.5

## 2023-08-16 MED ORDER — GLYCOPYRROLATE 0.2 MG/ML IJ SOLN
INTRAMUSCULAR | Status: DC | PRN
  Administered 2023-08-16 (×2): .1 mg via INTRAVENOUS

## 2023-08-16 MED ORDER — BRIMONIDINE TARTRATE-TIMOLOL 0.2-0.5 % OP SOLN
OPHTHALMIC | Status: DC | PRN
  Administered 2023-08-16: 1 [drp] via OPHTHALMIC

## 2023-08-16 MED ORDER — SIGHTPATH DOSE#1 NA HYALUR & NA CHOND-NA HYALUR IO KIT
PACK | INTRAOCULAR | Status: DC | PRN
  Administered 2023-08-16: 1 via OPHTHALMIC

## 2023-08-16 MED ORDER — ARMC OPHTHALMIC DILATING DROPS
1.0000 | OPHTHALMIC | Status: DC | PRN
  Administered 2023-08-16 (×3): 1 via OPHTHALMIC

## 2023-08-16 MED ORDER — SIGHTPATH DOSE#1 BSS IO SOLN
INTRAOCULAR | Status: DC | PRN
  Administered 2023-08-16: 15 mL via INTRAOCULAR

## 2023-08-16 MED ORDER — MOXIFLOXACIN HCL 0.5 % OP SOLN
OPHTHALMIC | Status: DC | PRN
  Administered 2023-08-16: .2 mL via OPHTHALMIC

## 2023-08-16 MED ORDER — FENTANYL CITRATE (PF) 100 MCG/2ML IJ SOLN
INTRAMUSCULAR | Status: AC
  Filled 2023-08-16: qty 2

## 2023-08-16 MED ORDER — LIDOCAINE HCL (PF) 2 % IJ SOLN
INTRAOCULAR | Status: DC | PRN
  Administered 2023-08-16: 1 mL via INTRAOCULAR

## 2023-08-16 MED ORDER — GLYCOPYRROLATE 0.2 MG/ML IJ SOLN
INTRAMUSCULAR | Status: AC
  Filled 2023-08-16: qty 1

## 2023-08-16 MED ORDER — TETRACAINE HCL 0.5 % OP SOLN
1.0000 [drp] | OPHTHALMIC | Status: DC | PRN
  Administered 2023-08-16 (×3): 1 [drp] via OPHTHALMIC

## 2023-08-16 MED ORDER — MIDAZOLAM HCL 2 MG/2ML IJ SOLN
INTRAMUSCULAR | Status: DC | PRN
  Administered 2023-08-16 (×2): 1 mg via INTRAVENOUS

## 2023-08-16 MED ORDER — FENTANYL CITRATE (PF) 100 MCG/2ML IJ SOLN
INTRAMUSCULAR | Status: DC | PRN
  Administered 2023-08-16 (×3): 25 ug via INTRAVENOUS

## 2023-08-16 MED ORDER — TETRACAINE HCL 0.5 % OP SOLN
OPHTHALMIC | Status: AC
  Filled 2023-08-16: qty 4

## 2023-08-16 MED ORDER — PHENYLEPHRINE-KETOROLAC 1-0.3 % IO SOLN
INTRAOCULAR | Status: DC | PRN
  Administered 2023-08-16: 73 mL via OPHTHALMIC

## 2023-08-16 SURGICAL SUPPLY — 13 items
CATARACT SUITE SIGHTPATH (MISCELLANEOUS) ×1 IMPLANT
DISSECTOR HYDRO NUCLEUS 50X22 (MISCELLANEOUS) ×1 IMPLANT
DRSG TEGADERM 2-3/8X2-3/4 SM (GAUZE/BANDAGES/DRESSINGS) ×1 IMPLANT
FEE CATARACT SUITE SIGHTPATH (MISCELLANEOUS) ×1 IMPLANT
GLOVE BIOGEL PI IND STRL 8 (GLOVE) ×1 IMPLANT
GLOVE SURG LX STRL 7.5 STRW (GLOVE) ×1 IMPLANT
GLOVE SURG PROTEXIS BL SZ6.5 (GLOVE) ×1 IMPLANT
GLOVE SURG SYN 6.5 PF PI BL (GLOVE) ×1 IMPLANT
LENS ENVISTA 21.0 (Intraocular Lens) ×1 IMPLANT
LENS IOL ENVISTA UV+ 21.0 (Intraocular Lens) IMPLANT
NDL FILTER BLUNT 18X1 1/2 (NEEDLE) ×1 IMPLANT
NEEDLE FILTER BLUNT 18X1 1/2 (NEEDLE) ×1 IMPLANT
SYR 3ML LL SCALE MARK (SYRINGE) ×1 IMPLANT

## 2023-08-16 NOTE — Anesthesia Postprocedure Evaluation (Signed)
 Anesthesia Post Note  Patient: Jay Ford  Procedure(s) Performed: PHACOEMULSIFICATION, CATARACT, WITH IOL INSERTION 6.97, 00:44.9 (Left)  Patient location during evaluation: PACU Anesthesia Type: MAC Level of consciousness: awake and alert Pain management: pain level controlled Vital Signs Assessment: post-procedure vital signs reviewed and stable Respiratory status: spontaneous breathing, nonlabored ventilation, respiratory function stable and patient connected to nasal cannula oxygen Cardiovascular status: stable and blood pressure returned to baseline Postop Assessment: no apparent nausea or vomiting Anesthetic complications: no   No notable events documented.   Last Vitals:  Vitals:   08/16/23 0940 08/16/23 0945  BP: 95/76 113/86  Pulse: 60   Resp: 18   Temp: 36.7 C   SpO2: 95%     Last Pain:  Vitals:   08/16/23 0945  TempSrc:   PainSc: 0-No pain                 [name] C Elexia Friedt

## 2023-08-16 NOTE — H&P (Signed)
 [facility]   Primary Care Physician:  Reubin Milan, MD Ophthalmologist: Dr. Deberah Pelton  Pre-Procedure History & Physical: HPI:  Jay Ford is a 63 y.o. male here for cataract surgery.   Past Medical History:  Diagnosis Date   Hyperlipidemia    Hypertension    controlled on meds   Remote history of TIA    Sleep apnea    CPAP   Tremors of nervous system    Right upper extremity   West Nile Virus infection 2008   pain in knee-thigh in right leg    Past Surgical History:  Procedure Laterality Date   COLONOSCOPY WITH PROPOFOL N/A 10/10/2019   Procedure: COLONOSCOPY WITH BIOPSY;  Surgeon: Midge Minium, MD;  Location: [facility];  Service: Endoscopy;  Laterality: N/A;  priority 3   HAND SURGERY Bilateral 2013   carpal tunnel both hands   POLYPECTOMY N/A 10/10/2019   Procedure: POLYPECTOMY;  Surgeon: Midge Minium, MD;  Location: [facility];  Service: Endoscopy;  Laterality: N/A;   ROTATOR CUFF REPAIR Bilateral 2013    Prior to Admission medications   Medication Sig Start Date End Date Taking? Authorizing Provider  amLODipine-valsartan (EXFORGE) 5-160 MG tablet Take 1 tablet by mouth daily. 10/18/22 10/18/23  [provider]  aspirin EC 81 MG tablet Take 1 tablet by mouth daily. 10/18/22 10/18/23  [provider]  atorvastatin (LIPITOR) 40 MG tablet Take 1 tablet (40 mg total) by mouth daily. 07/22/23   Reubin Milan, MD  CANNABIDIOL PO Take by mouth. Using daily for pain    [provider]  cyanocobalamin (,VITAMIN B-12,) 1000 MCG/ML injection Inject 1 mL (1,000 mcg total) into the muscle every 30 (thirty) days. 10/19/21   Reubin Milan, MD  DULoxetine (CYMBALTA) 30 MG capsule Take 3 capsules (90 mg total) by mouth daily. 07/19/23   Reubin Milan, MD  metoprolol succinate (TOPROL-XL) 25 MG 24 hr tablet Take 1 tablet (25 mg total) by mouth at bedtime. Take with or immediately following a meal. 07/19/23   Reubin Milan, MD  Multiple Vitamin (MULTIVITAMIN) LIQD Take 5 mLs by mouth daily.    [provider]  nitroGLYCERIN (NITROSTAT) 0.4 MG SL tablet Place 0.4 mg under the tongue every 5 (five) minutes as needed for chest pain. 10/09/22 10/09/23  [provider]  NON FORMULARY at bedtime. CPAP nightly    [provider]  Omega-3 Fatty Acids (OMEGA 3 500 PO) Take by mouth.    [provider]  Probiotic Product (ALOE 16109 & PROBIOTICS PO) Take by mouth.    [provider]  Vitamin D, Ergocalciferol, (DRISDOL) 1.25 MG (50000 UNIT) CAPS capsule Take 50,000 Units by mouth every 7 (seven) days.    [provider]    Allergies as of 08/01/2023 - Review Complete 08/01/2023  Allergen Reaction Noted   Gabapentin Other (See Comments) 10/21/2014   Topiramate Rash 08/09/2016    Family History  Problem Relation Age of Onset   Cancer Father        prostate cancer   Diabetes Father    Heart attack Father    Cancer Mother        breast cancer   Diabetes Sister     Social History   Socioeconomic History   Marital status: Married    Spouse name: Jay Ford   Number of children: 2   Years of education: Not on file   Highest education level: High school  graduate  Occupational History   Occupation: parent    Comment: on [employer] Disability  Tobacco Use   Smoking status: Never   Smokeless tobacco: Never  Vaping Use   Vaping status: Never Used  Substance and Sexual Activity   Alcohol use: No   Drug use: No   Sexual activity: Yes    Birth control/protection: None  Other Topics Concern   Not on file  Social History Narrative   Not on file   Social Drivers of Health   Financial Resource Strain: Low Risk  (02/06/2023)   Overall Financial Resource Strain (CARDIA)    Difficulty of Paying Living Expenses: Not hard at all  Food Insecurity: No Food Insecurity (02/06/2023)   Hunger Vital Sign    Worried About Running Out of Food in the Last Year:  Never true    Ran Out of Food in the Last Year: Never true  Transportation Needs: No Transportation Needs (02/06/2023)   PRAPARE - Administrator, Civil Service (Medical): No    Lack of Transportation (Non-Medical): No  Physical Activity: Insufficiently Active (02/06/2023)   Exercise Vital Sign    Days of Exercise per Week: 3 days    Minutes of Exercise per Session: 30 min  Stress: No Stress Concern Present (02/06/2023)   [employer] of Occupational Health - Occupational Stress Questionnaire    Feeling of Stress : Not at all  Social Connections: Moderately Isolated (02/06/2023)   Social Connection and Isolation Panel [NHANES]    Frequency of Communication with Friends and Family: More than three times a week    Frequency of Social Gatherings with Friends and Family: Never    Attends Religious Services: Never    Database administrator or Organizations: No    Attends Banker Meetings: Never    Marital Status: Married  Catering manager Violence: Not At Risk (02/06/2023)   Humiliation, Afraid, Rape, and Kick questionnaire    Fear of Current or Ex-Partner: No    Emotionally Abused: No    Physically Abused: No    Sexually Abused: No    Review of Systems: See HPI, otherwise negative ROS  Physical Exam: There were no vitals taken for this visit. General:   Alert, cooperative in NAD Head:  Normocephalic and atraumatic. Respiratory:  Normal work of breathing. Cardiovascular:  RRR  Impression/Plan: Jay Ford is here for cataract surgery.  Risks, benefits, limitations, and alternatives regarding cataract surgery have been reviewed with the patient.  Questions have been answered.  All parties agreeable.   Estanislado Pandy, MD  08/16/2023, 7:05 AM

## 2023-08-16 NOTE — Op Note (Signed)
 OPERATIVE NOTE  Jay Ford [id number] 08/16/2023   PREOPERATIVE DIAGNOSIS: Nuclear sclerotic cataract left eye. H25.12   POSTOPERATIVE DIAGNOSIS: Nuclear sclerotic cataract left eye. H25.12   PROCEDURE:  Phacoemusification with posterior chamber intraocular lens placement of the left eye  Ultrasound time: Procedure(s): PHACOEMULSIFICATION, CATARACT, WITH IOL INSERTION 6.97, 00:44.9 (Left)  LENS:   Implant Name Type Inv. Item Serial No. Manufacturer Lot No. LRB No. Used Action  LENS ENVISTA 21.0 - [id number] Intraocular Lens LENS ENVISTA 21.0 [id number] SIGHTPATH  Left 1 Implanted      SURGEON:  Julious Payer. Rolley Sims, MD   ANESTHESIA:  Topical with tetracaine drops, augmented with 1% preservative-free intracameral lidocaine.   COMPLICATIONS:  None.   DESCRIPTION OF PROCEDURE:  The patient was identified in the holding room and transported to the operating room and placed in the supine position under the operating microscope.  The left eye was identified as the operative eye, which was prepped and draped in the usual sterile ophthalmic fashion.   A 1 millimeter clear-corneal paracentesis was made inferotemporally. Preservative-free 1% lidocaine mixed with 1:1,000 bisulfite-free aqueous solution of epinephrine was injected into the anterior chamber. The anterior chamber was then filled with Viscoat viscoelastic. A 2.4 millimeter keratome was used to make a clear-corneal incision superotemporally. A curvilinear capsulorrhexis was made with a cystotome and capsulorrhexis forceps. Balanced salt solution was used to hydrodissect and hydrodelineate the nucleus. Phacoemulsification was then used to remove the lens nucleus and epinucleus. The remaining cortex was then removed using the irrigation and aspiration handpiece. Provisc was then placed into the capsular bag to distend it for lens placement. A +21.00  D [id number] (now called EE) intraocular lens was then injected into the capsular bag. The  remaining viscoelastic was aspirated.   Wounds were hydrated with balanced salt solution.  The anterior chamber was inflated to a physiologic pressure with balanced salt solution.  No wound leaks were noted. Moxifloxacin was injected intracamerally.  Timolol and Brimonidine drops were applied to the eye.  The patient was taken to the recovery room in stable condition without complications of anesthesia or surgery.  [name] 08/16/2023, 9:37 AM

## 2023-08-16 NOTE — Transfer of Care (Signed)
 Immediate Anesthesia Transfer of Care Note  Patient: Jay Ford  Procedure(s) Performed: PHACOEMULSIFICATION, CATARACT, WITH IOL INSERTION 6.97, 00:44.9 (Left)  Patient Location: PACU  Anesthesia Type: MAC  Level of Consciousness: awake, alert  and patient cooperative  Airway and Oxygen Therapy: Patient Spontanous Breathing and Patient connected to supplemental oxygen  Post-op Assessment: Post-op Vital signs reviewed, Patient's Cardiovascular Status Stable, Respiratory Function Stable, Patent Airway and No signs of Nausea or vomiting  Post-op Vital Signs: Reviewed and stable  Complications: No notable events documented.

## 2023-08-17 ENCOUNTER — Encounter: Payer: Self-pay | Admitting: Ophthalmology

## 2023-08-24 ENCOUNTER — Ambulatory Visit
Admission: EM | Admit: 2023-08-24 | Discharge: 2023-08-24 | Disposition: A | Discharge status: Home / Self Care | Attending: Family Medicine | Admitting: Family Medicine

## 2023-08-24 ENCOUNTER — Encounter: Payer: Self-pay | Admitting: Emergency Medicine

## 2023-08-24 DIAGNOSIS — H5712 Ocular pain, left eye: Secondary | ICD-10-CM | POA: Diagnosis not present

## 2023-08-24 DIAGNOSIS — Z9889 Other specified postprocedural states: Secondary | ICD-10-CM

## 2023-08-24 NOTE — Discharge Instructions (Signed)
 Go to [facility] in [place].  On the right side there is a door that says after hours entrance toward the back of the building. Dr Inez Pilgrim will meet you there.

## 2023-08-24 NOTE — ED Notes (Signed)
 Patient is being discharged from the Urgent Care and sent to the [facility] in [place] via private vehicle with wife . Per Dr. Rachael Darby, patient is in need of higher level of care due to recent left eye surgery and sharp pain today. Patient is aware and verbalizes understanding of plan of care.  Vitals:   08/24/23 1909  BP: 129/80  Pulse: 74  Resp: 15  Temp: 98.8 F (37.1 C)  SpO2: 95%

## 2023-08-24 NOTE — ED Triage Notes (Signed)
 Patient states that he had cataract surgery in his left eye 2 weeks ago.  Patient states that he moved his left eye suddenly this afternoon and develop a sharp behind his left eye.  Patient reports light sensitivity.  Patient reports watery drainage from the left eye and it states that it burns.

## 2023-08-24 NOTE — ED Provider Notes (Signed)
 [facility]    CSN: [id number] Arrival date & time: 08/24/23  1851      History   Chief Complaint Chief Complaint  Patient presents with   Eye Problem    left    HPI HPI  Jay Ford is a 63 y.o. male.    Jay Ford presents for left eye pain that started suddenly this afternoon.  He had left cataract surgery a week ago.  He did a follow up visit and they said everything looked good. He looked left and had trouble seeing and water ran down his cheeks.  Pain "12/10/"  Wife reports it was like he was dizzy and looked like he couldn't see out of either eye.  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     Past Medical History:  Diagnosis Date   Hyperlipidemia    Hypertension    controlled on meds   Remote history of TIA    Sleep apnea    CPAP   Tremors of nervous system    Right upper extremity   West Nile Virus infection 2008   pain in knee-thigh in right leg    Patient Active Problem List   Diagnosis Date Noted   Obesity, morbid (HCC) 07/19/2023   Allergy to meat 02/12/2023   Prediabetes 05/10/2022   B12 deficiency 10/19/2021   Vitamin D deficiency 10/19/2021   Radiculopathy with lower extremity symptoms 07/19/2021   Polyp of descending colon    Nocturia 10/11/2017   Pain medication agreement signed 08/09/2016   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   Obesity, Class I, BMI 30-34.9 06/30/2015   Essential hypertension 02/13/2014   Meralgia paresthetica of both lower extremities 02/13/2014   Palpitations 10/09/2011   Mixed hyperlipidemia    MI (mitral incompetence) 02/07/2011   OSA (obstructive sleep apnea) 02/07/2011   Hx of colonic polyp 02/07/2011   Testosterone deficiency 02/07/2011   Tubular adenoma 02/07/2011    Past Surgical History:  Procedure Laterality Date   CATARACT EXTRACTION W/PHACO Left 08/16/2023   Procedure: PHACOEMULSIFICATION, CATARACT, WITH IOL INSERTION 6.97, 00:44.9;  Surgeon: Estanislado Pandy, MD;  Location: [facility];  Service: Ophthalmology;  Laterality: Left;   COLONOSCOPY WITH PROPOFOL N/A 10/10/2019   Procedure: COLONOSCOPY WITH BIOPSY;  Surgeon: Midge Minium, MD;  Location: [facility];  Service: Endoscopy;  Laterality: N/A;  priority 3   EYE SURGERY     HAND SURGERY Bilateral 2013   carpal tunnel both hands   POLYPECTOMY N/A 10/10/2019   Procedure: POLYPECTOMY;  Surgeon: Midge Minium, MD;  Location: [facility];  Service: Endoscopy;  Laterality: N/A;   ROTATOR CUFF REPAIR Bilateral 2013       Home Medications    Prior to Admission medications   Medication Sig Start Date End Date Taking? Authorizing Provider  amLODipine-valsartan (EXFORGE) 5-160 MG tablet Take 1 tablet by mouth daily. 10/18/22 10/18/23  [provider]  aspirin EC 81 MG tablet Take 1 tablet by mouth daily. 10/18/22 10/18/23  [provider]  atorvastatin (LIPITOR) 40 MG tablet Take 1 tablet (40 mg total) by mouth daily. 07/22/23   Reubin Milan, MD  CANNABIDIOL PO Take by mouth. Using daily for pain    [provider]  cyanocobalamin (,VITAMIN B-12,) 1000 MCG/ML injection Inject 1 mL (1,000 mcg total) into the muscle every 30 (thirty) days. 10/19/21   Reubin Milan, MD  DULoxetine (CYMBALTA) 30 MG capsule Take 3 capsules (90  mg total) by mouth daily. 07/19/23   Reubin Milan, MD  metoprolol succinate (TOPROL-XL) 25 MG 24 hr tablet Take 1 tablet (25 mg total) by mouth at bedtime. Take with or immediately following a meal. 07/19/23   Reubin Milan, MD  Multiple Vitamin (MULTIVITAMIN) LIQD Take 5 mLs by mouth daily.    [provider]  nitroGLYCERIN (NITROSTAT) 0.4 MG SL tablet Place 0.4 mg under the tongue every 5 (five) minutes as needed for chest pain. 10/09/22 10/09/23  [provider]  NON FORMULARY at bedtime. CPAP nightly    [provider]  Omega-3 Fatty Acids (OMEGA 3 500 PO) Take by mouth.     [provider]  Probiotic Product (ALOE 16109 & PROBIOTICS PO) Take by mouth.    [provider]  Vitamin D, Ergocalciferol, (DRISDOL) 1.25 MG (50000 UNIT) CAPS capsule Take 50,000 Units by mouth every 7 (seven) days.    [provider]    Family History Family History  Problem Relation Age of Onset   Cancer Father        prostate cancer   Diabetes Father    Heart attack Father    Cancer Mother        breast cancer   Diabetes Sister     Social History Social History   Tobacco Use   Smoking status: Never   Smokeless tobacco: Never  Vaping Use   Vaping status: Never Used  Substance Use Topics   Alcohol use: No   Drug use: No     Allergies   Gabapentin and Topiramate   Review of Systems Review of Systems : negative unless otherwise stated in HPI.      Physical Exam Triage Vital Signs ED Triage Vitals  Encounter Vitals Group     BP 08/24/23 1909 129/80     Systolic BP Percentile --      Diastolic BP Percentile --      Pulse Rate 08/24/23 1909 74     Resp 08/24/23 1909 15     Temp 08/24/23 1909 98.8 F (37.1 C)     Temp Source 08/24/23 1909 Oral     SpO2 08/24/23 1909 95 %     Weight 08/24/23 1907 233 lb 0.4 oz (105.7 kg)     Height 08/24/23 1907 5\' 6"  (1.676 m)     Head Circumference --      Peak Flow --      Pain Score 08/24/23 1907 10     Pain Loc --      Pain Education --      Exclude from Growth Chart --    No data found.  Updated Vital Signs BP 129/80 (BP Location: Right Arm)   Pulse 74   Temp 98.8 F (37.1 C) (Oral)   Resp 15   Ht 5\' 6"  (1.676 m)   Wt 105.7 kg   SpO2 95%   BMI 37.61 kg/m   Visual Acuity Right Eye Distance: 20/200 uncorrected Left Eye Distance: 20/40 uncorrected Bilateral Distance: 20/30 uncorrected  Right Eye Near:   Left Eye Near:    Bilateral Near:     Physical Exam  GEN: uncomfortable appearing male, in no acute distress  NECK: normal ROM  RESP: no increased work of  breathing EYES:     General: Lids are normal.  Vision grossly intact. Gaze aligned appropriately.        Right eye: No foreign body, discharge or hordeolum.  Left eye: No foreign body, discharge or hordeolum.     Extraocular Movements: Extraocular movements intact.  SKIN: warm and dry   UC Treatments / Results  Labs (all labs ordered are listed, but only abnormal results are displayed) Labs Reviewed - No data to display  EKG   Radiology No results found.  Procedures Procedures (including critical care time)  Medications Ordered in UC Medications - No data to display    Visual Acuity  Right Eye Distance: 20/200 uncorrected Left Eye Distance: 20/40 uncorrected Bilateral Distance: 20/30 uncorrected  Right Eye Near:   Left Eye Near:    Bilateral Near:       Initial Impression / Assessment and Plan / UC Course  I have reviewed the triage vital signs and the nursing notes.  Pertinent labs & imaging results that were available during my care of the patient were reviewed by me and considered in my medical decision making (see chart for details).     Patient is a 63 y.o. male who presents after left eye pain with watery discharge after having cataract surgery a week ago.  Vision screen abnormal but in the opposite eye. Given his recent surgery recommended he contact his surgery office. Pt unsure who the surgeon was but it was at [facility] last Thursday. Per chart review, surgery performed by Dr Rolley Sims.   Called and conferred with on-call physician for [facility], Dr Inez Pilgrim about pt's case.  Dr Inez Pilgrim advised pt to be seen by him in the office today. Pt to meet Dr Inez Pilgrim at the [facility] office for further evaluation. Pt's wife to drive him to [place].   Discussed MDM, treatment plan and plan for follow-up with patient and his wife who agree with plan.  Final Clinical Impressions(s) / UC Diagnoses   Final diagnoses:  Acute left eye  pain  H/O eye surgery     Discharge Instructions      Go to [facility] in [place].  On the right side there is a door that says after hours entrance toward the back of the building. Dr Inez Pilgrim will meet you there.      ED Prescriptions   None    PDMP not reviewed this encounter.   Katha Cabal, DO 08/26/23 1522

## 2023-08-25 DIAGNOSIS — Z961 Presence of intraocular lens: Secondary | ICD-10-CM | POA: Diagnosis not present

## 2023-09-02 DIAGNOSIS — G4733 Obstructive sleep apnea (adult) (pediatric): Secondary | ICD-10-CM | POA: Diagnosis not present

## 2023-09-04 ENCOUNTER — Other Ambulatory Visit: Payer: Self-pay | Admitting: Internal Medicine

## 2023-09-04 ENCOUNTER — Ambulatory Visit: Payer: Self-pay

## 2023-09-04 DIAGNOSIS — Z91018 Allergy to other foods: Secondary | ICD-10-CM

## 2023-09-04 NOTE — Telephone Encounter (Signed)
 Information obtained from wife.  Pt wife requesting referral to GI [facility] or [place], prefers [place] d/t proximity. Pt wife states that he has spoke to PCP about this before. States that she feels GI is best place for him, states "its his belly so that's where I think he needs to go".  Pt gets distended/hard abd and vomits when he eats red meat. Pt wife refused scheduling with PCP.   Copied from CRM [id number]. Topic: Clinical - Red Word Triage >> Sep 04, 2023  2:34 PM Turkey B wrote: [facility] that prompted transfer to Nurse Triage: pt's wife says pt vomits when he eats meat and gets had Reason for Disposition . [1] Caller requesting NON-URGENT health information AND [2] PCP's office is the best resource  Protocols used: Information Only Call - No Triage-A-AH

## 2023-09-04 NOTE — Telephone Encounter (Signed)
 Please review.  KP

## 2023-09-04 NOTE — Progress Notes (Unsigned)
 Date:  09/04/2023   Name:  Jay Ford   DOB:  1961/01/29   MRN:  [id number]   Chief Complaint: No chief complaint on file.  HPI  Review of Systems   Lab Results  Component Value Date   NA 139 07/19/2023   K 4.8 07/19/2023   CO2 22 07/19/2023   GLUCOSE 105 (H) 07/19/2023   BUN 11 07/19/2023   CREATININE 1.02 07/19/2023   CALCIUM 9.5 07/19/2023   EGFR 83 07/19/2023   GFRNONAA >60 08/06/2020   Lab Results  Component Value Date   CHOL 226 (H) 07/19/2023   HDL 49 07/19/2023   LDLCALC 149 (H) 07/19/2023   TRIG 157 (H) 07/19/2023   CHOLHDL 4.6 07/19/2023   Lab Results  Component Value Date   TSH 1.83 08/11/2021   Lab Results  Component Value Date   HGBA1C 6.1 (H) 07/19/2023   Lab Results  Component Value Date   WBC 7.2 07/19/2023   HGB 14.3 07/19/2023   HCT 43.0 07/19/2023   MCV 93 07/19/2023   PLT 230 07/19/2023   Lab Results  Component Value Date   ALT 34 07/19/2023   AST 36 07/19/2023   ALKPHOS 109 07/19/2023   BILITOT 0.2 07/19/2023   Lab Results  Component Value Date   VD25OH 36.8 10/19/2021     Patient Active Problem List   Diagnosis Date Noted   Obesity, morbid (HCC) 07/19/2023   Allergy to meat 02/12/2023   Prediabetes 05/10/2022   B12 deficiency 10/19/2021   Vitamin D deficiency 10/19/2021   Radiculopathy with lower extremity symptoms 07/19/2021   Polyp of descending colon    Nocturia 10/11/2017   Pain medication agreement signed 08/09/2016   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   Obesity, Class I, BMI 30-34.9 06/30/2015   Essential hypertension 02/13/2014   Meralgia paresthetica of both lower extremities 02/13/2014   Palpitations 10/09/2011   Mixed hyperlipidemia    MI (mitral incompetence) 02/07/2011   OSA (obstructive sleep apnea) 02/07/2011   Hx of colonic polyp 02/07/2011   Testosterone deficiency 02/07/2011   Tubular adenoma 02/07/2011    Allergies  Allergen Reactions   Gabapentin  Other (See Comments)    Is not right, made see things  Other Reaction(s): Hallucinations, Other (See Comments)  Is not right    Other Reaction(s): Other (See Comments)    Is not right, made see things    Is not right Is not right, made see things   Topiramate Rash    Blistered rash on face    Past Surgical History:  Procedure Laterality Date   CATARACT EXTRACTION W/PHACO Left 08/16/2023   Procedure: PHACOEMULSIFICATION, CATARACT, WITH IOL INSERTION 6.97, 00:44.9;  Surgeon: Estanislado Pandy, MD;  Location: [facility];  Service: Ophthalmology;  Laterality: Left;   COLONOSCOPY WITH PROPOFOL N/A 10/10/2019   Procedure: COLONOSCOPY WITH BIOPSY;  Surgeon: Midge Minium, MD;  Location: [facility];  Service: Endoscopy;  Laterality: N/A;  priority 3   EYE SURGERY     HAND SURGERY Bilateral 2013   carpal tunnel both hands   POLYPECTOMY N/A 10/10/2019   Procedure: POLYPECTOMY;  Surgeon: Midge Minium, MD;  Location: [facility];  Service: Endoscopy;  Laterality: N/A;   ROTATOR CUFF REPAIR Bilateral 2013    Social History   Tobacco Use   Smoking status: Never   Smokeless tobacco: Never  Vaping Use   Vaping status: Never Used  Substance Use Topics  Alcohol use: No   Drug use: No     Medication list has been reviewed and updated.  No outpatient medications have been marked as taking for the 09/04/23 encounter (Orders Only) with Sheron Dixons, MD.       07/19/2023    8:35 AM 02/12/2023    3:33 PM 05/10/2022    2:28 PM 07/19/2021   11:11 AM  GAD 7 : Generalized Anxiety Score  Nervous, Anxious, on Edge 1 0 0 0  Control/stop worrying 0 0 0 0  Worry too much - different things 0 0 0 0  Trouble relaxing 0 0 0 0  Restless 0 0 0 0  Easily annoyed or irritable 0 0 0 0  Afraid - awful might happen 0 0 0 0  Total GAD 7 Score 1 0 0 0  Anxiety Difficulty Not difficult at all Not difficult at all Not difficult at all Not difficult at all        07/19/2023    8:35 AM 02/12/2023    3:32 PM 02/06/2023   10:33 AM  Depression screen PHQ 2/9  Decreased Interest 2 0 0  Down, Depressed, Hopeless 0 0 0  PHQ - 2 Score 2 0 0  Altered sleeping 0 0 0  Tired, decreased energy 2 1 0  Change in appetite 1 0 0  Feeling bad or failure about yourself  0 0 0  Trouble concentrating 2 0 0  Moving slowly or fidgety/restless 0 0 0  Suicidal thoughts 0 0 0  PHQ-9 Score 7 1 0  Difficult doing work/chores Somewhat difficult Not difficult at all Not difficult at all    BP Readings from Last 3 Encounters:  08/24/23 129/80  08/16/23 113/86  07/19/23 112/76    Physical Exam  Wt Readings from Last 3 Encounters:  08/24/23 233 lb 0.4 oz (105.7 kg)  08/16/23 233 lb (105.7 kg)  07/19/23 236 lb 4 oz (107.2 kg)    There were no vitals taken for this visit.  Assessment and Plan:  Problem List Items Addressed This Visit   None   No follow-ups on file.    Sheron Dixons, MD [facility] [place]

## 2023-09-29 NOTE — Progress Notes (Unsigned)
 [facility] [address] [place], [place] [address]  Pulmonary Sleep Medicine   Office Visit Note  Patient Name: Jay Ford DOB: [birth year] MRN [id number]    Chief Complaint: Obstructive Sleep Apnea visit  Brief History:  Jay Ford is seen today for follow up after setup on CPAP @ 12cmH20.  The patient has a 30 plus year  history of sleep apnea. Patient is using PAP nightly.  The patient feels rested after sleeping with PAP.  The patient reports benefiting from PAP use. Reported sleepiness is  improved and the Epworth Sleepiness Score is *** out of 24. The patient *** take naps. The patient complains of the following: ***  The compliance download shows 75% compliance with an average use time of 5 hours 29 minutes. The AHI is 0.5  The patient *** of limb movements disrupting sleep.  ROS  General: (-) fever, (-) chills, (-) night sweat Nose and Sinuses: (-) nasal stuffiness or itchiness, (-) postnasal drip, (-) nosebleeds, (-) sinus trouble. Mouth and Throat: (-) sore throat, (-) hoarseness. Neck: (-) swollen glands, (-) enlarged thyroid , (-) neck pain. Respiratory: *** cough, *** shortness of breath, *** wheezing. Neurologic: *** numbness, *** tingling. Psychiatric: *** anxiety, *** depression   Current Medication: Outpatient Encounter Medications as of 10/01/2023  Medication Sig   amLODipine-valsartan (EXFORGE) 5-160 MG tablet Take 1 tablet by mouth daily.   aspirin  EC 81 MG tablet Take 1 tablet by mouth daily.   atorvastatin  (LIPITOR) 40 MG tablet Take 1 tablet (40 mg total) by mouth daily.   CANNABIDIOL PO Take by mouth. Using daily for pain   cyanocobalamin  (,VITAMIN B-12,) 1000 MCG/ML injection Inject 1 mL (1,000 mcg total) into the muscle every 30 (thirty) days.   DULoxetine  (CYMBALTA ) 30 MG capsule Take 3 capsules (90 mg total) by mouth daily.   metoprolol  succinate (TOPROL -XL) 25 MG 24 hr tablet Take 1 tablet (25 mg total) by mouth at bedtime. Take with or  immediately following a meal.   Multiple Vitamin (MULTIVITAMIN) LIQD Take 5 mLs by mouth daily.   nitroGLYCERIN (NITROSTAT) 0.4 MG SL tablet Place 0.4 mg under the tongue every 5 (five) minutes as needed for chest pain.   NON FORMULARY at bedtime. CPAP nightly   Omega-3 Fatty Acids (OMEGA 3 500 PO) Take by mouth.   Probiotic Product (ALOE 10000 & PROBIOTICS PO) Take by mouth.   Vitamin D , Ergocalciferol , (DRISDOL) 1.25 MG (50000 UNIT) CAPS capsule Take 50,000 Units by mouth every 7 (seven) days.   No facility-administered encounter medications on file as of 10/01/2023.    Surgical History: Past Surgical History:  Procedure Laterality Date   CATARACT EXTRACTION W/PHACO Left 08/16/2023   Procedure: PHACOEMULSIFICATION, CATARACT, WITH IOL INSERTION 6.97, 00:44.9;  Surgeon: Trudi Fus, MD;  Location: [facility];  Service: Ophthalmology;  Laterality: Left;   COLONOSCOPY WITH PROPOFOL  N/A 10/10/2019   Procedure: COLONOSCOPY WITH BIOPSY;  Surgeon: Marnee Sink, MD;  Location: [facility];  Service: Endoscopy;  Laterality: N/A;  priority 3   EYE SURGERY     HAND SURGERY Bilateral 2013   carpal tunnel both hands   POLYPECTOMY N/A 10/10/2019   Procedure: POLYPECTOMY;  Surgeon: Marnee Sink, MD;  Location: [facility];  Service: Endoscopy;  Laterality: N/A;   ROTATOR CUFF REPAIR Bilateral 2013    Medical History: Past Medical History:  Diagnosis Date   Hyperlipidemia    Hypertension    controlled on meds   Remote history of TIA  Sleep apnea    CPAP   Tremors of nervous system    Right upper extremity   West Nile Virus infection 2008   pain in knee-thigh in right leg    Family History: Non contributory to the present illness  Social History: Social History   Socioeconomic History   Marital status: Married    Spouse name: Jay Ford   Number of children: 2   Years of education: Not on file   Highest education level: High school  graduate  Occupational History   Occupation: parent    Comment: on SS Disability  Tobacco Use   Smoking status: Never   Smokeless tobacco: Never  Vaping Use   Vaping status: Never Used  Substance and Sexual Activity   Alcohol use: No   Drug use: No   Sexual activity: Yes    Birth control/protection: None  Other Topics Concern   Not on file  Social History Narrative   Not on file   Social Drivers of Health   Financial Resource Strain: Low Risk  (02/06/2023)   Overall Financial Resource Strain (CARDIA)    Difficulty of Paying Living Expenses: Not hard at all  Food Insecurity: No Food Insecurity (02/06/2023)   Hunger Vital Sign    Worried About Running Out of Food in the Last Year: Never true    Ran Out of Food in the Last Year: Never true  Transportation Needs: No Transportation Needs (02/06/2023)   PRAPARE - Administrator, Civil Service (Medical): No    Lack of Transportation (Non-Medical): No  Physical Activity: Insufficiently Active (02/06/2023)   Exercise Vital Sign    Days of Exercise per Week: 3 days    Minutes of Exercise per Session: 30 min  Stress: No Stress Concern Present (02/06/2023)   [employer] of Occupational Health - Occupational Stress Questionnaire    Feeling of Stress : Not at all  Social Connections: Moderately Isolated (02/06/2023)   Social Connection and Isolation Panel [NHANES]    Frequency of Communication with Friends and Family: More than three times a week    Frequency of Social Gatherings with Friends and Family: Never    Attends Religious Services: Never    Database administrator or Organizations: No    Attends Banker Meetings: Never    Marital Status: Married  Catering manager Violence: Not At Risk (02/06/2023)   Humiliation, Afraid, Rape, and Kick questionnaire    Fear of Current or Ex-Partner: No    Emotionally Abused: No    Physically Abused: No    Sexually Abused: No    Vital Signs: There were no vitals  taken for this visit. There is no height or weight on file to calculate BMI.    Examination: General Appearance: The patient is well-developed, well-nourished, and in no distress. Neck Circumference: 45 cm Skin: Gross inspection of skin unremarkable. Head: normocephalic, no gross deformities. Eyes: no gross deformities noted. ENT: ears appear grossly normal Neurologic: Alert and oriented. No involuntary movements.  STOP BANG RISK ASSESSMENT S (snore) Have you been told that you snore?     NO   T (tired) Are you often tired, fatigued, or sleepy during the day?   NO  O (obstruction) Do you stop breathing, choke, or gasp during sleep? NO   P (pressure) Do you have or are you being treated for high blood pressure? YES   B (BMI) Is your body index greater than 35 kg/m? YES   A (  age) Are you 24 years old or older? YES   N (neck) Do you have a neck circumference greater than 16 inches?   YES   G (gender) Are you a male? YES   TOTAL STOP/BANG "YES" ANSWERS        A STOP-Bang score of 2 or less is considered low risk, and a score of 5 or more is high risk for having either moderate or severe OSA. For people who score 3 or 4, doctors may need to perform further assessment to determine how likely they are to have OSA.         EPWORTH SLEEPINESS SCALE:  Scale:  (0)= no chance of dozing; (1)= slight chance of dozing; (2)= moderate chance of dozing; (3)= high chance of dozing  Chance  Situtation    Sitting and reading: ***    Watching TV: ***    Sitting Inactive in public: ***    As a passenger in car: ***      Lying down to rest: ***    Sitting and talking: ***    Sitting quielty after lunch: ***    In a car, stopped in traffic: ***   TOTAL SCORE:   *** out of 24    SLEEP STUDIES:  PSG - 04/21/14 @ [facility] - AHI 18/hr Titration - 08/03/14 @ [facility] - CPAP @ 11cmH20 PSG - 03/01/23 - AHI 49.3/hr, Supine AHI 86/hr, min Sp02 78% Titration - 03/20/23 - CPAP @  12cmH20    CPAP COMPLIANCE DATA:  Date Range: 08/02/23 - 09/02/23  Average Daily Use: 6 hours 45 minutes  Median Use: 7 hours 4 minutes  Compliance for > 4 Hours: 75% days  AHI: 0.5 respiratory events per hour  Days Used: 26/32  Mask Leak: 16.5  95th Percentile Pressure: 12cmH20         LABS: Recent Results (from the past 2160 hours)  CBC with Differential/Platelet     Status: None   Collection Time: 07/19/23  9:46 AM  Result Value Ref Range   WBC 7.2 3.4 - 10.8 x10E3/uL   RBC 4.63 4.14 - 5.80 x10E6/uL   Hemoglobin 14.3 13.0 - 17.7 g/dL   Hematocrit 54.0 98.1 - 51.0 %   MCV 93 79 - 97 fL   MCH 30.9 26.6 - 33.0 pg   MCHC 33.3 31.5 - 35.7 g/dL   RDW 19.1 47.8 - 29.5 %   Platelets 230 150 - 450 x10E3/uL   Neutrophils 55 Not Estab. %   Lymphs 32 Not Estab. %   Monocytes 10 Not Estab. %   Eos 2 Not Estab. %   Basos 1 Not Estab. %   Neutrophils Absolute 4.0 1.4 - 7.0 x10E3/uL   Lymphocytes Absolute 2.3 0.7 - 3.1 x10E3/uL   Monocytes Absolute 0.7 0.1 - 0.9 x10E3/uL   EOS (ABSOLUTE) 0.1 0.0 - 0.4 x10E3/uL   Basophils Absolute 0.1 0.0 - 0.2 x10E3/uL   Immature Granulocytes 0 Not Estab. %   Immature Grans (Abs) 0.0 0.0 - 0.1 x10E3/uL  Comprehensive metabolic panel     Status: Abnormal   Collection Time: 07/19/23  9:46 AM  Result Value Ref Range   Glucose 105 (H) 70 - 99 mg/dL   BUN 11 8 - 27 mg/dL   Creatinine, Ser 6.21 0.76 - 1.27 mg/dL   eGFR 83 >30 QM/VHQ/4.69   BUN/Creatinine Ratio 11 10 - 24   Sodium 139 134 - 144 mmol/L   Potassium 4.8 3.5 - 5.2 mmol/L  Chloride 103 96 - 106 mmol/L   CO2 22 20 - 29 mmol/L   Calcium  9.5 8.6 - 10.2 mg/dL   Total Protein 7.2 6.0 - 8.5 g/dL   Albumin 4.5 3.9 - 4.9 g/dL   Globulin, Total 2.7 1.5 - 4.5 g/dL   Bilirubin Total 0.2 0.0 - 1.2 mg/dL   Alkaline Phosphatase 109 44 - 121 IU/L   AST 36 0 - 40 IU/L   ALT 34 0 - 44 IU/L  Hemoglobin A1c     Status: Abnormal   Collection Time: 07/19/23  9:46 AM  Result Value Ref  Range   Hgb A1c MFr Bld 6.1 (H) 4.8 - 5.6 %    Comment:          Prediabetes: 5.7 - 6.4          Diabetes: >6.4          Glycemic control for adults with diabetes: <7.0    Est. average glucose Bld gHb Est-mCnc 128 mg/dL  Lipid panel     Status: Abnormal   Collection Time: 07/19/23  9:46 AM  Result Value Ref Range   Cholesterol, Total 226 (H) 100 - 199 mg/dL   Triglycerides 161 (H) 0 - 149 mg/dL   HDL 49 >09 mg/dL   VLDL Cholesterol Cal 28 5 - 40 mg/dL   LDL Chol Calc (NIH) 604 (H) 0 - 99 mg/dL   Chol/HDL Ratio 4.6 0.0 - 5.0 ratio    Comment:                                   T. Chol/HDL Ratio                                             Men  Women                               1/2 Avg.Risk  3.4    3.3                                   Avg.Risk  5.0    4.4                                2X Avg.Risk  9.6    7.1                                3X Avg.Risk 23.4   11.0   PSA     Status: None   Collection Time: 07/19/23  9:46 AM  Result Value Ref Range   Prostate Specific Ag, Serum 0.5 0.0 - 4.0 ng/mL    Comment: Roche ECLIA methodology. According to the American Urological Association, Serum PSA should decrease and remain at undetectable levels after radical prostatectomy. The AUA defines biochemical recurrence as an initial PSA value 0.2 ng/mL or greater followed by a subsequent confirmatory PSA value 0.2 ng/mL or greater. Values obtained with different assay methods or kits cannot be used interchangeably. Results cannot be interpreted as absolute evidence of the presence or absence  of malignant disease.   Urinalysis, Routine w reflex microscopic     Status: Abnormal   Collection Time: 07/19/23  9:46 AM  Result Value Ref Range   Specific Gravity, UA 1.024 1.005 - 1.030   pH, UA 6.0 5.0 - 7.5   Color, UA Yellow Yellow   Appearance Ur Clear Clear   Leukocytes,UA Negative Negative   Protein,UA 1+ (A) Negative/Trace   Glucose, UA Negative Negative   Ketones, UA Trace (A) Negative    RBC, UA Negative Negative   Bilirubin, UA Negative Negative   Urobilinogen, Ur 1.0 0.2 - 1.0 mg/dL   Nitrite, UA Negative Negative   Microscopic Examination See below:     Comment: Microscopic was indicated and was performed.  Microscopic Examination     Status: Abnormal   Collection Time: 07/19/23  9:46 AM  Result Value Ref Range   WBC, UA 0-5 0 - 5 /hpf   RBC, Urine 0-2 0 - 2 /hpf   Epithelial Cells (non renal) None seen 0 - 10 /hpf   Casts None seen None seen /lpf   Crystals Present (A) N/A   Crystal Type Calcium  Oxalate N/A   Bacteria, UA None seen None seen/Few    Radiology: No results found.  No results found.  No results found.    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 The patient was reminded how to *** and advised to ***. The patient was also counselled on ***. The compliance is ***. The AHI is ***.   ***  General Counseling: I have discussed the findings of the evaluation and examination with Cornel Diesel.  I have also discussed any further diagnostic evaluation thatmay be needed or ordered today. Dushaun  verbalizes understanding of the findings of todays visit. We also reviewed his medications today and discussed drug interactions and side effects including but not limited excessive drowsiness and altered mental states. We also discussed that there is always a risk not just to him but also people around him. he has been encouraged to call the office with any questions or concerns that should arise related to todays visit.  No orders of the defined types were placed in this encounter.       I have personally obtained a history, examined the patient, evaluated laboratory and imaging results, formulated the assessment and plan and placed orders.  Cordie Deters, MD [facility] Diplomate ABMS Pulmonary Critical Care Medicine and Sleep Medicine

## 2023-10-01 ENCOUNTER — Ambulatory Visit: Admitting: Internal Medicine

## 2023-10-02 DIAGNOSIS — G4733 Obstructive sleep apnea (adult) (pediatric): Secondary | ICD-10-CM | POA: Diagnosis not present

## 2023-10-03 DIAGNOSIS — H5712 Ocular pain, left eye: Secondary | ICD-10-CM | POA: Diagnosis not present

## 2023-10-03 DIAGNOSIS — Z9889 Other specified postprocedural states: Secondary | ICD-10-CM | POA: Diagnosis not present

## 2023-11-05 DIAGNOSIS — H5712 Ocular pain, left eye: Secondary | ICD-10-CM | POA: Diagnosis not present

## 2023-11-05 DIAGNOSIS — Z9889 Other specified postprocedural states: Secondary | ICD-10-CM | POA: Diagnosis not present

## 2023-11-22 DIAGNOSIS — Z9889 Other specified postprocedural states: Secondary | ICD-10-CM | POA: Diagnosis not present

## 2023-11-22 DIAGNOSIS — H5712 Ocular pain, left eye: Secondary | ICD-10-CM | POA: Diagnosis not present

## 2023-12-11 NOTE — Progress Notes (Signed)
 [facility] Quality Team Note  Name: Jay Ford Date of Birth: October 21, 1960 MRN: [id number] Date: 12/11/2023  [facility] Quality Team has reviewed this patient's chart, please see recommendations below:  [facility] Quality Other; (Chart reviewed for KED measure. Patient needs Urine Microalbumin/Creatinine ratio completed. Upcoming appt 01/22/2024. Can this be added to their services that day?)

## 2023-12-27 ENCOUNTER — Ambulatory Visit: Payer: Medicare Other | Admitting: Internal Medicine

## 2024-01-07 ENCOUNTER — Ambulatory Visit: Admitting: Internal Medicine

## 2024-01-08 DIAGNOSIS — R112 Nausea with vomiting, unspecified: Secondary | ICD-10-CM | POA: Diagnosis not present

## 2024-01-08 DIAGNOSIS — Z860101 Personal history of adenomatous and serrated colon polyps: Secondary | ICD-10-CM | POA: Diagnosis not present

## 2024-01-08 DIAGNOSIS — R1084 Generalized abdominal pain: Secondary | ICD-10-CM | POA: Diagnosis not present

## 2024-01-08 DIAGNOSIS — K9049 Malabsorption due to intolerance, not elsewhere classified: Secondary | ICD-10-CM | POA: Diagnosis not present

## 2024-01-08 DIAGNOSIS — A9232 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: Secondary | ICD-10-CM | POA: Diagnosis not present

## 2024-01-09 ENCOUNTER — Encounter: Payer: Self-pay | Admitting: Internal Medicine

## 2024-01-09 ENCOUNTER — Ambulatory Visit (INDEPENDENT_AMBULATORY_CARE_PROVIDER_SITE_OTHER): Admitting: Internal Medicine

## 2024-01-09 VITALS — BP 114/72 | HR 64 | Ht 66.0 in | Wt 237.0 lb

## 2024-01-09 DIAGNOSIS — R7303 Prediabetes: Secondary | ICD-10-CM

## 2024-01-09 DIAGNOSIS — I10 Essential (primary) hypertension: Secondary | ICD-10-CM

## 2024-01-09 DIAGNOSIS — G4733 Obstructive sleep apnea (adult) (pediatric): Secondary | ICD-10-CM

## 2024-01-09 DIAGNOSIS — Z6838 Body mass index (BMI) 38.0-38.9, adult: Secondary | ICD-10-CM

## 2024-01-09 DIAGNOSIS — A9232 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: Secondary | ICD-10-CM

## 2024-01-09 DIAGNOSIS — E782 Mixed hyperlipidemia: Secondary | ICD-10-CM | POA: Diagnosis not present

## 2024-01-09 DIAGNOSIS — G5713 Meralgia paresthetica, bilateral lower limbs: Secondary | ICD-10-CM

## 2024-01-09 DIAGNOSIS — R1013 Epigastric pain: Secondary | ICD-10-CM | POA: Insufficient documentation

## 2024-01-09 MED ORDER — METOPROLOL SUCCINATE ER 25 MG PO TB24: 25.0000 mg | ORAL_TABLET | Freq: Every day | ORAL | 1 refills | Status: DC

## 2024-01-09 MED ORDER — AMLODIPINE BESYLATE-VALSARTAN 5-160 MG PO TABS: 1.0000 | ORAL_TABLET | Freq: Every day | ORAL | 1 refills | Status: DC

## 2024-01-09 MED ORDER — DULOXETINE HCL 30 MG PO CPEP: 90.0000 mg | ORAL_CAPSULE | Freq: Every day | ORAL | 1 refills | Status: DC

## 2024-01-09 MED ORDER — ATORVASTATIN CALCIUM 40 MG PO TABS: 40.0000 mg | ORAL_TABLET | Freq: Every day | ORAL | 1 refills | Status: DC

## 2024-01-09 NOTE — Progress Notes (Signed)
 Date:  01/09/2024   Name:  Jay Ford   DOB:  [birth year]   MRN:  [id number]   Chief Complaint: Hypertension, Hyperlipidemia, and Prediabetes  Hypertension This is a chronic problem. The problem is controlled. Pertinent negatives include no chest pain, headaches, palpitations or shortness of breath. Past treatments include beta blockers, angiotensin blockers and calcium  channel blockers. The current treatment provides significant improvement. There is no history of kidney disease, CAD/MI or CVA.  Hyperlipidemia This is a chronic problem. The problem is uncontrolled. Recent lipid tests were reviewed and are high. Associated symptoms include a focal weakness (chronic from [facility]). Pertinent negatives include no chest pain or shortness of breath. Current antihyperlipidemic treatment includes statins (dose changed). The current treatment provides significant improvement of lipids.  Abdominal Pain This is a chronic problem. The problem has been unchanged. The pain is located in the epigastric region. The pain is mild. The quality of the pain is a sensation of fullness and dull. Associated symptoms include nausea. Pertinent negatives include no constipation, diarrhea, headaches, vomiting or weight loss. Prior workup: Seen by GI yesterday and EGD being planned.  OSA - he tried CPAP for about 2 months but never could get accustomed to it.  He did several studies, was seen by pulmonary Dr. Fernand but eventually returned the machine.  He admits to poor sleep and feeling generally tired.  He has gained some weight despite trying to cut back by eating fish and vegetable.  He can not exercise due to problems with his right leg from [place] Nile virus.  Review of Systems  Constitutional:  Negative for fatigue, unexpected weight change and weight loss.  HENT:  Negative for nosebleeds.   Eyes:  Negative for visual disturbance.  Respiratory:  Negative for cough, chest tightness, shortness of breath and wheezing.    Cardiovascular:  Negative for chest pain, palpitations and leg swelling.  Gastrointestinal:  Positive for abdominal pain and nausea. Negative for constipation, diarrhea and vomiting.  Neurological:  Positive for focal weakness (chronic from [facility]). Negative for dizziness, weakness, light-headedness and headaches.     Lab Results  Component Value Date   NA 139 07/19/2023   K 4.8 07/19/2023   CO2 22 07/19/2023   GLUCOSE 105 (H) 07/19/2023   BUN 11 07/19/2023   CREATININE 1.02 07/19/2023   CALCIUM  9.5 07/19/2023   EGFR 83 07/19/2023   GFRNONAA >60 08/06/2020   Lab Results  Component Value Date   CHOL 226 (H) 07/19/2023   HDL 49 07/19/2023   LDLCALC 149 (H) 07/19/2023   TRIG 157 (H) 07/19/2023   CHOLHDL 4.6 07/19/2023   Lab Results  Component Value Date   TSH 1.83 08/11/2021   Lab Results  Component Value Date   HGBA1C 6.1 (H) 07/19/2023   Lab Results  Component Value Date   WBC 7.2 07/19/2023   HGB 14.3 07/19/2023   HCT 43.0 07/19/2023   MCV 93 07/19/2023   PLT 230 07/19/2023   Lab Results  Component Value Date   ALT 34 07/19/2023   AST 36 07/19/2023   ALKPHOS 109 07/19/2023   BILITOT 0.2 07/19/2023   Lab Results  Component Value Date   VD25OH 36.8 10/19/2021     Patient Active Problem List   Diagnosis Date Noted   Epigastric pain 01/09/2024   Allergy  to meat 02/12/2023   Prediabetes 05/10/2022   B12 deficiency 10/19/2021   Vitamin D  deficiency 10/19/2021   Radiculopathy with lower extremity symptoms 07/19/2021  Polyp of descending colon    Nocturia 10/11/2017   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   BMI 38.0-38.9,adult 06/30/2015   Essential hypertension 02/13/2014   Meralgia paresthetica of both lower extremities 02/13/2014   Mixed hyperlipidemia    MI (mitral incompetence) 02/07/2011   OSA (obstructive sleep apnea) 02/07/2011   Hx of colonic polyp 02/07/2011   Testosterone deficiency 02/07/2011   Tubular  adenoma 02/07/2011    Allergies  Allergen Reactions   Gabapentin Other (See Comments)    Is not right, made see things  Other Reaction(s): Hallucinations, Other (See Comments)  Is not right    Other Reaction(s): Other (See Comments)    Is not right, made see things    Is not right Is not right, made see things  Is not right    Is not right, made see things    Is not right, made see things  Other Reaction(s): Hallucinations, Other (See Comments)  Is not right  Other Reaction(s): Other (See Comments)  Is not right, made see things  Is not right Is not right, made see things    Is not right  Other Reaction(s): Other (See Comments)  Is not right, made see things  Is not right Is not right, made see things   Topiramate Rash and Dermatitis    Blistered rash on face  Blistered rash on face    Blistered rash on face  Blistered rash on face Blistered rash on face    Past Surgical History:  Procedure Laterality Date   CATARACT EXTRACTION W/PHACO Left 08/16/2023   Procedure: PHACOEMULSIFICATION, CATARACT, WITH IOL INSERTION 6.97, 00:44.9;  Surgeon: Enola Feliciano Hugger, MD;  Location: [facility];  Service: Ophthalmology;  Laterality: Left;   COLONOSCOPY WITH PROPOFOL  N/A 10/10/2019   Procedure: COLONOSCOPY WITH BIOPSY;  Surgeon: Jinny Carmine, MD;  Location: [facility];  Service: Endoscopy;  Laterality: N/A;  priority 3   EYE SURGERY     HAND SURGERY Bilateral 2013   carpal tunnel both hands   POLYPECTOMY N/A 10/10/2019   Procedure: POLYPECTOMY;  Surgeon: Jinny Carmine, MD;  Location: [facility];  Service: Endoscopy;  Laterality: N/A;   ROTATOR CUFF REPAIR Bilateral 2013    Social History   Tobacco Use   Smoking status: Never   Smokeless tobacco: Never  Vaping Use   Vaping status: Never Used  Substance Use Topics   Alcohol use: No   Drug use: No     Medication list has been reviewed and updated.  Current Meds  Medication Sig    CANNABIDIOL PO Take by mouth. Using daily for pain   cyanocobalamin  (,VITAMIN B-12,) 1000 MCG/ML injection Inject 1 mL (1,000 mcg total) into the muscle every 30 (thirty) days.   Multiple Vitamin (MULTIVITAMIN) LIQD Take 5 mLs by mouth daily.   nitroGLYCERIN (NITROSTAT) 0.4 MG SL tablet Place 0.4 mg under the tongue every 5 (five) minutes as needed for chest pain.   NON FORMULARY at bedtime. CPAP nightly   Omega-3 Fatty Acids (OMEGA 3 500 PO) Take by mouth.   Probiotic Product (ALOE 10000 & PROBIOTICS PO) Take by mouth.   Vitamin D , Ergocalciferol , (DRISDOL) 1.25 MG (50000 UNIT) CAPS capsule Take 50,000 Units by mouth every 7 (seven) days.   [DISCONTINUED] amLODipine -valsartan  (EXFORGE ) 5-160 MG tablet Take 1 tablet by mouth daily.   [DISCONTINUED] atorvastatin  (LIPITOR) 40 MG tablet Take 1 tablet (40 mg total) by mouth daily.   [DISCONTINUED] DULoxetine  (CYMBALTA ) 30 MG  capsule Take 3 capsules (90 mg total) by mouth daily.   [DISCONTINUED] metoprolol  succinate (TOPROL -XL) 25 MG 24 hr tablet Take 1 tablet (25 mg total) by mouth at bedtime. Take with or immediately following a meal.       07/19/2023    8:35 AM 02/12/2023    3:33 PM 05/10/2022    2:28 PM 07/19/2021   11:11 AM  GAD 7 : Generalized Anxiety Score  Nervous, Anxious, on Edge 1 0 0 0  Control/stop worrying 0 0 0 0  Worry too much - different things 0 0 0 0  Trouble relaxing 0 0 0 0  Restless 0 0 0 0  Easily annoyed or irritable 0 0 0 0  Afraid - awful might happen 0 0 0 0  Total GAD 7 Score 1 0 0 0  Anxiety Difficulty Not difficult at all Not difficult at all Not difficult at all Not difficult at all       07/19/2023    8:35 AM 02/12/2023    3:32 PM 02/06/2023   10:33 AM  Depression screen PHQ 2/9  Decreased Interest 2 0 0  Down, Depressed, Hopeless 0 0 0  PHQ - 2 Score 2 0 0  Altered sleeping 0 0 0  Tired, decreased energy 2 1 0  Change in appetite 1 0 0  Feeling bad or failure about yourself  0 0 0  Trouble  concentrating 2 0 0  Moving slowly or fidgety/restless 0 0 0  Suicidal thoughts 0 0 0  PHQ-9 Score 7 1 0  Difficult doing work/chores Somewhat difficult Not difficult at all Not difficult at all    BP Readings from Last 3 Encounters:  01/09/24 114/72  08/24/23 129/80  08/16/23 113/86    Physical Exam Vitals and nursing note reviewed.  Constitutional:      General: He is not in acute distress.    Appearance: Normal appearance. He is well-developed. He is obese.  HENT:     Head: Normocephalic and atraumatic.  Neck:     Vascular: No carotid bruit.  Cardiovascular:     Rate and Rhythm: Normal rate and regular rhythm.     Heart sounds: No murmur heard. Pulmonary:     Effort: Pulmonary effort is normal. No respiratory distress.     Breath sounds: No wheezing or rhonchi.  Abdominal:     General: Abdomen is protuberant. Bowel sounds are decreased.     Palpations: Abdomen is soft. There is no shifting dullness, hepatomegaly or splenomegaly.     Tenderness: There is abdominal tenderness in the epigastric area. There is no guarding or rebound.  Musculoskeletal:     Cervical back: Normal range of motion.     Right lower leg: No edema.     Left lower leg: No edema.  Lymphadenopathy:     Cervical: No cervical adenopathy.  Skin:    General: Skin is warm and dry.     Capillary Refill: Capillary refill takes less than 2 seconds.     Findings: No rash.  Neurological:     Mental Status: He is alert and oriented to person, place, and time.  Psychiatric:        Attention and Perception: Attention normal.        Mood and Affect: Mood normal.        Behavior: Behavior normal.     Wt Readings from Last 3 Encounters:  01/09/24 237 lb (107.5 kg)  08/24/23 233 lb 0.4 oz (105.7 kg)  08/16/23 233 lb (105.7 kg)    BP 114/72   Pulse 64   Ht 5' 6 (1.676 m)   Wt 237 lb (107.5 kg)   SpO2 97%   BMI 38.25 kg/m   Assessment and Plan:  Problem List Items Addressed This Visit        Unprioritized   Mixed hyperlipidemia (Chronic)   LDL is  Lab Results  Component Value Date   LDLCALC 149 (H) 07/19/2023   Currently taking Atorvastatin  40 mg - dose increased after last labs.   No medication side effects or other concerns. Recommended LDL goal is < 100.       Relevant Medications   metoprolol  succinate (TOPROL -XL) 25 MG 24 hr tablet   atorvastatin  (LIPITOR) 40 MG tablet   amLODipine -valsartan  (EXFORGE ) 5-160 MG tablet   Other Relevant Orders   Comprehensive metabolic panel with GFR   Lipid panel   Essential hypertension (Chronic)   Blood pressure is well controlled on metoprolol , amlodipine  and Valsartan . No medication side effects noted. Plan to continue current medications.       Relevant Medications   metoprolol  succinate (TOPROL -XL) 25 MG 24 hr tablet   atorvastatin  (LIPITOR) 40 MG tablet   amLODipine -valsartan  (EXFORGE ) 5-160 MG tablet   Other Relevant Orders   CBC with Differential/Platelet   TSH + free T4   Meralgia paresthetica of both lower extremities (Chronic)   Relevant Medications   DULoxetine  (CYMBALTA ) 30 MG capsule   OSA (obstructive sleep apnea) (Chronic)   Unable to tolerated CPAP - recently turned in the equipment. He complains of fatigue, poor sleep, inability to lose weight He might be a [place] candidate - doubt insurance will cover it .      BMI 38.0-38.9,adult (Chronic)   May benefit from GLP-1 if covered.      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  Check labs - if > 6.5 can consider Semaglutide for DM and weight loss      Relevant Orders   Hemoglobin A1c   Epigastric pain - Primary   Persistent  abdominal bloating and discomfort. +alpha gal but no improvement with diet changes Seen by GI - EGD planned.       No follow-ups on file.    Leita HILARIO Adie, MD [facility] [place]

## 2024-01-09 NOTE — Assessment & Plan Note (Signed)
 Lab Results  Component Value Date   HGBA1C 6.1 (H) 07/19/2023  Continue low carb diet.  Check labs - if > 6.5 can consider Semaglutide for DM and weight loss

## 2024-01-09 NOTE — Assessment & Plan Note (Signed)
 Chronic leg pain on the right GI is concerned that he also has gastrointestinal complications EGD planned

## 2024-01-09 NOTE — Assessment & Plan Note (Signed)
 Persistent abdominal bloating and discomfort. +alpha gal but no improvement with diet changes Seen by GI - EGD planned.

## 2024-01-09 NOTE — Assessment & Plan Note (Signed)
 May benefit from GLP-1 if covered.

## 2024-01-09 NOTE — Assessment & Plan Note (Signed)
 LDL is  Lab Results  Component Value Date   LDLCALC 149 (H) 07/19/2023   Currently taking Atorvastatin  40 mg - dose increased after last labs.   No medication side effects or other concerns. Recommended LDL goal is < 100.

## 2024-01-09 NOTE — Assessment & Plan Note (Signed)
 Unable to tolerated CPAP - recently turned in the equipment. He complains of fatigue, poor sleep, inability to lose weight He might be a [place] candidate - doubt insurance will cover it .

## 2024-01-09 NOTE — Assessment & Plan Note (Signed)
 Blood pressure is well controlled on metoprolol , amlodipine  and Valsartan . No medication side effects noted. Plan to continue current medications.

## 2024-01-10 ENCOUNTER — Ambulatory Visit: Payer: Self-pay | Admitting: Internal Medicine

## 2024-01-10 LAB — TSH+FREE T4
Free T4: 0.96 ng/dL (ref 0.82–1.77)
TSH: 0.888 u[IU]/mL (ref 0.450–4.500)

## 2024-01-10 LAB — CBC WITH DIFFERENTIAL/PLATELET
Basophils Absolute: 0 x10E3/uL (ref 0.0–0.2)
Basos: 0 %
EOS (ABSOLUTE): 0.1 x10E3/uL (ref 0.0–0.4)
Eos: 2 %
Hematocrit: 39.7 % (ref 37.5–51.0)
Hemoglobin: 12.7 g/dL — ABNORMAL LOW (ref 13.0–17.7)
Immature Grans (Abs): 0 x10E3/uL (ref 0.0–0.1)
Immature Granulocytes: 0 %
Lymphocytes Absolute: 2.5 x10E3/uL (ref 0.7–3.1)
Lymphs: 35 %
MCH: 30.3 pg (ref 26.6–33.0)
MCHC: 32 g/dL (ref 31.5–35.7)
MCV: 95 fL (ref 79–97)
Monocytes Absolute: 0.7 x10E3/uL (ref 0.1–0.9)
Monocytes: 11 %
Neutrophils Absolute: 3.7 x10E3/uL (ref 1.4–7.0)
Neutrophils: 52 %
Platelets: 228 x10E3/uL (ref 150–450)
RBC: 4.19 x10E6/uL (ref 4.14–5.80)
RDW: 12.5 % (ref 11.6–15.4)
WBC: 7.1 x10E3/uL (ref 3.4–10.8)

## 2024-01-10 LAB — LIPID PANEL
Chol/HDL Ratio: 5.5 ratio — ABNORMAL HIGH (ref 0.0–5.0)
Cholesterol, Total: 214 mg/dL — ABNORMAL HIGH (ref 100–199)
HDL: 39 mg/dL — ABNORMAL LOW (ref >39)
LDL Chol Calc (NIH): 130 mg/dL — ABNORMAL HIGH (ref 0–99)
Triglycerides: 255 mg/dL — ABNORMAL HIGH (ref 0–149)
VLDL Cholesterol Cal: 45 mg/dL — ABNORMAL HIGH (ref 5–40)

## 2024-01-10 LAB — COMPREHENSIVE METABOLIC PANEL WITH GFR
ALT: 32 IU/L (ref 0–44)
AST: 30 IU/L (ref 0–40)
Albumin: 4.3 g/dL (ref 3.9–4.9)
Alkaline Phosphatase: 91 IU/L (ref 44–121)
BUN/Creatinine Ratio: 15 (ref 10–24)
BUN: 17 mg/dL (ref 8–27)
Bilirubin Total: 0.3 mg/dL (ref 0.0–1.2)
CO2: 23 mmol/L (ref 20–29)
Calcium: 9.1 mg/dL (ref 8.6–10.2)
Chloride: 103 mmol/L (ref 96–106)
Creatinine, Ser: 1.11 mg/dL (ref 0.76–1.27)
Globulin, Total: 2.7 g/dL (ref 1.5–4.5)
Glucose: 95 mg/dL (ref 70–99)
Potassium: 4.2 mmol/L (ref 3.5–5.2)
Sodium: 138 mmol/L (ref 134–144)
Total Protein: 7 g/dL (ref 6.0–8.5)
eGFR: 75 mL/min/1.73 (ref >59)

## 2024-01-10 LAB — HEMOGLOBIN A1C
Est. average glucose Bld gHb Est-mCnc: 126 mg/dL
Hgb A1c MFr Bld: 6 % — ABNORMAL HIGH (ref 4.8–5.6)

## 2024-01-15 ENCOUNTER — Ambulatory Visit: Admitting: Anesthesiology

## 2024-01-15 ENCOUNTER — Encounter
Admission: RE | Disposition: A | Discharge status: Home / Self Care | Payer: Self-pay | Source: Home / Self Care | Attending: Gastroenterology

## 2024-01-15 ENCOUNTER — Ambulatory Visit
Admission: RE | Admit: 2024-01-15 | Discharge: 2024-01-15 | Disposition: A | Discharge status: Home / Self Care | Attending: Gastroenterology | Admitting: Gastroenterology

## 2024-01-15 ENCOUNTER — Encounter: Payer: Self-pay | Admitting: Gastroenterology

## 2024-01-15 DIAGNOSIS — I10 Essential (primary) hypertension: Secondary | ICD-10-CM | POA: Insufficient documentation

## 2024-01-15 DIAGNOSIS — G4730 Sleep apnea, unspecified: Secondary | ICD-10-CM | POA: Insufficient documentation

## 2024-01-15 DIAGNOSIS — K9049 Malabsorption due to intolerance, not elsewhere classified: Secondary | ICD-10-CM | POA: Diagnosis not present

## 2024-01-15 DIAGNOSIS — R112 Nausea with vomiting, unspecified: Secondary | ICD-10-CM | POA: Diagnosis not present

## 2024-01-15 DIAGNOSIS — K209 Esophagitis, unspecified without bleeding: Secondary | ICD-10-CM | POA: Diagnosis not present

## 2024-01-15 DIAGNOSIS — E785 Hyperlipidemia, unspecified: Secondary | ICD-10-CM | POA: Diagnosis not present

## 2024-01-15 DIAGNOSIS — R1013 Epigastric pain: Secondary | ICD-10-CM | POA: Diagnosis present

## 2024-01-15 DIAGNOSIS — A9232 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: Secondary | ICD-10-CM | POA: Diagnosis not present

## 2024-01-15 DIAGNOSIS — K2100 Gastro-esophageal reflux disease with esophagitis, without bleeding: Secondary | ICD-10-CM | POA: Diagnosis not present

## 2024-01-15 HISTORY — PX: ESOPHAGOGASTRODUODENOSCOPY: SHX5428

## 2024-01-15 SURGERY — EGD (ESOPHAGOGASTRODUODENOSCOPY)
Anesthesia: General

## 2024-01-15 MED ORDER — SODIUM CHLORIDE 0.9 % IV SOLN: INTRAVENOUS | Status: DC

## 2024-01-15 MED ORDER — MIDAZOLAM HCL 2 MG/2ML IJ SOLN
INTRAMUSCULAR | Status: DC | PRN
  Administered 2024-01-15: 2 mg via INTRAVENOUS

## 2024-01-15 MED ORDER — GLYCOPYRROLATE 0.2 MG/ML IJ SOLN
INTRAMUSCULAR | Status: DC | PRN
  Administered 2024-01-15: .2 mg via INTRAVENOUS

## 2024-01-15 MED ORDER — PROPOFOL 500 MG/50ML IV EMUL
INTRAVENOUS | Status: DC | PRN
Start: 2024-01-15 — End: 2024-01-15
  Administered 2024-01-15: 155 ug/kg/min via INTRAVENOUS

## 2024-01-15 MED ORDER — LIDOCAINE HCL (CARDIAC) PF 100 MG/5ML IV SOSY
PREFILLED_SYRINGE | INTRAVENOUS | Status: DC | PRN
Start: 2024-01-15 — End: 2024-01-15
  Administered 2024-01-15: 100 mg via INTRAVENOUS

## 2024-01-15 MED ORDER — MIDAZOLAM HCL 2 MG/2ML IJ SOLN
INTRAMUSCULAR | Status: AC
Start: 2024-01-15 — End: 2024-01-15
  Filled 2024-01-15: qty 2

## 2024-01-15 MED ORDER — PROPOFOL 10 MG/ML IV BOLUS
INTRAVENOUS | Status: DC | PRN
Start: 2024-01-15 — End: 2024-01-15
  Administered 2024-01-15: 20 mg via INTRAVENOUS
  Administered 2024-01-15: 60 mg via INTRAVENOUS
  Administered 2024-01-15: 20 mg via INTRAVENOUS

## 2024-01-15 NOTE — Transfer of Care (Signed)
 Immediate Anesthesia Transfer of Care Note  Patient: Jay Ford  Procedure(s) Performed: EGD (ESOPHAGOGASTRODUODENOSCOPY)  Patient Location: Endoscopy Unit  Anesthesia Type:General  Level of Consciousness: drowsy and patient cooperative  Airway & Oxygen Therapy: Patient Spontanous Breathing and Patient connected to face mask oxygen  Post-op Assessment: Report given to RN and Post -op Vital signs reviewed and stable  Post vital signs: Reviewed and stable  Last Vitals:  Vitals Value Taken Time  BP 106/79 01/15/24 11:15  Temp 36.1 C 01/15/24 11:14  Pulse 79 01/15/24 11:21  Resp 13 01/15/24 11:21  SpO2 93 % 01/15/24 11:21  Vitals shown include unfiled device data.  Last Pain:  Vitals:   01/15/24 1114  TempSrc: Temporal  PainSc: Asleep         Complications: No notable events documented.

## 2024-01-15 NOTE — H&P (Signed)
 Jay Ford , MD [address], [address], [place], [place], [address] Phone: [phone] Fax: [phone]  Primary Care Physician:  Justus Leita DEL, MD   Pre-Procedure History & Physical: HPI:  Jay Ford is a 63 y.o. male is here for an endoscopy    Past Medical History:  Diagnosis Date   Hyperlipidemia    Hypertension    controlled on meds   Remote history of TIA    Sleep apnea    CPAP   Tremors of nervous system    Right upper extremity   West Nile Virus infection 2008   pain in knee-thigh in right leg    Past Surgical History:  Procedure Laterality Date   CATARACT EXTRACTION W/PHACO Left 08/16/2023   Procedure: PHACOEMULSIFICATION, CATARACT, WITH IOL INSERTION 6.97, 00:44.9;  Surgeon: Enola Feliciano Hugger, MD;  Location: [facility];  Service: Ophthalmology;  Laterality: Left;   COLONOSCOPY WITH PROPOFOL  N/A 10/10/2019   Procedure: COLONOSCOPY WITH BIOPSY;  Surgeon: Jinny Carmine, MD;  Location: [facility];  Service: Endoscopy;  Laterality: N/A;  priority 3   EYE SURGERY     HAND SURGERY Bilateral 2013   carpal tunnel both hands   POLYPECTOMY N/A 10/10/2019   Procedure: POLYPECTOMY;  Surgeon: Jinny Carmine, MD;  Location: [facility];  Service: Endoscopy;  Laterality: N/A;   ROTATOR CUFF REPAIR Bilateral 2013    Prior to Admission medications   Medication Sig Start Date End Date Taking? Authorizing Provider  amLODipine -valsartan  (EXFORGE ) 5-160 MG tablet Take 1 tablet by mouth daily. 01/09/24 04/01/25  Justus Leita DEL, MD  atorvastatin  (LIPITOR) 40 MG tablet Take 1 tablet (40 mg total) by mouth daily. 01/09/24   Justus Leita DEL, MD  CANNABIDIOL PO Take by mouth. Using daily for pain    [provider]  cyanocobalamin  (,VITAMIN B-12,) 1000 MCG/ML injection Inject 1 mL (1,000 mcg total) into the muscle every 30 (thirty) days. 10/19/21   Justus Leita DEL, MD  DULoxetine  (CYMBALTA ) 30 MG capsule Take 3 capsules (90 mg  total) by mouth daily. 01/09/24   Justus Leita DEL, MD  metoprolol  succinate (TOPROL -XL) 25 MG 24 hr tablet Take 1 tablet (25 mg total) by mouth at bedtime. Take with or immediately following a meal. 01/09/24   Justus Leita DEL, MD  Multiple Vitamin (MULTIVITAMIN) LIQD Take 5 mLs by mouth daily.    [provider]  nitroGLYCERIN (NITROSTAT) 0.4 MG SL tablet Place 0.4 mg under the tongue every 5 (five) minutes as needed for chest pain. 10/09/22 01/09/24  [provider]  NON FORMULARY at bedtime. CPAP nightly    [provider]  Omega-3 Fatty Acids (OMEGA 3 500 PO) Take by mouth.    [provider]  Probiotic Product (ALOE 89999 & PROBIOTICS PO) Take by mouth.    [provider]  Vitamin D , Ergocalciferol , (DRISDOL) 1.25 MG (50000 UNIT) CAPS capsule Take 50,000 Units by mouth every 7 (seven) days.    [provider]    Allergies as of 01/08/2024 - Review Complete 08/24/2023  Allergen Reaction Noted   Gabapentin Other (See Comments) 10/21/2014   Topiramate Rash 08/09/2016    Family History  Problem Relation Age of Onset   Cancer Father        prostate cancer   Diabetes Father    Heart attack Father    Cancer Mother        breast cancer   Diabetes Sister     Social History  Socioeconomic History   Marital status: Married    Spouse name: [name]   Number of children: 2   Years of education: Not on file   Highest education level: High school graduate  Occupational History   Occupation: parent    Comment: on [employer] Disability  Tobacco Use   Smoking status: Never   Smokeless tobacco: Never  Vaping Use   Vaping status: Never Used  Substance and Sexual Activity   Alcohol use: No   Drug use: No   Sexual activity: Yes    Birth control/protection: None  Other Topics Concern   Not on file  Social History Narrative   Not on file   Social Drivers of Health   Financial Resource Strain: Low Risk  (01/08/2024)   Received  from [facility]   Overall Financial Resource Strain (CARDIA)    Difficulty of Paying Living Expenses: Not hard at all  Food Insecurity: No Food Insecurity (01/08/2024)   Received from [facility]   Hunger Vital Sign    Within the past 12 months, you worried that your food would run out before you got the money to buy more.: Never true    Within the past 12 months, the food you bought just didn't last and you didn't have money to get more.: Never true  Transportation Needs: No Transportation Needs (01/08/2024)   Received from [facility] - Transportation    In the past 12 months, has lack of transportation kept you from medical appointments or from getting medications?: No    Lack of Transportation (Non-Medical): No  Physical Activity: Insufficiently Active (02/06/2023)   Exercise Vital Sign    Days of Exercise per Week: 3 days    Minutes of Exercise per Session: 30 min  Stress: No Stress Concern Present (02/06/2023)   [employer] of Occupational Health - Occupational Stress Questionnaire    Feeling of Stress : Not at all  Social Connections: Moderately Isolated (02/06/2023)   Social Connection and Isolation Panel    Frequency of Communication with Friends and Family: More than three times a week    Frequency of Social Gatherings with Friends and Family: Never    Attends Religious Services: Never    Database administrator or Organizations: No    Attends Banker Meetings: Never    Marital Status: Married  Catering manager Violence: Not At Risk (02/06/2023)   Humiliation, Afraid, Rape, and Kick questionnaire    Fear of Current or Ex-Partner: No    Emotionally Abused: No    Physically Abused: No    Sexually Abused: No    Review of Systems: See HPI, otherwise negative ROS  Physical Exam: There were no vitals taken for this visit. General:   Alert,  pleasant and cooperative in NAD Head:  Normocephalic  and atraumatic. Neck:  Supple; no masses or thyromegaly. Lungs:  Clear throughout to auscultation, normal respiratory effort.    Heart:  +S1, +S2, Regular rate and rhythm, No edema. Abdomen:  Soft, nontender and nondistended. Normal bowel sounds, without guarding, and without rebound.   Neurologic:  Alert and  oriented x4;  grossly normal neurologically.  Impression/Plan: Jay Ford is here for an endoscopy  to be performed for  evaluation of food intolerances .     Risks, benefits, limitations, and alternatives regarding endoscopy have been reviewed with the patient.  Questions have been answered.  All parties agreeable.   Jay Kung,  MD  01/15/2024, 10:07 AM

## 2024-01-15 NOTE — Anesthesia Preprocedure Evaluation (Addendum)
 Anesthesia Evaluation  Patient identified by MRN, date of birth, ID band Patient awake    Reviewed: Allergy  & Precautions, H&P , NPO status , Patient's Chart, lab work & pertinent test results  Airway Mallampati: II  TM Distance: >3 FB Neck ROM: full    Dental no notable dental hx.    Pulmonary sleep apnea    Pulmonary exam normal        Cardiovascular hypertension, Normal cardiovascular exam     Neuro/Psych TIA (Remote history of TIA) negative psych ROS   GI/Hepatic negative GI ROS, Neg liver ROS,,,  Endo/Other  negative endocrine ROS    Renal/GU negative Renal ROS  negative genitourinary   Musculoskeletal   Abdominal   Peds  Hematology negative hematology ROS (+)   Anesthesia Other Findings Has tremors when he gets nervous, has RLS at night, has OSA untreated, has not used CPAP in years.   Past Medical History: No date: Hyperlipidemia No date: Hypertension     Comment:  controlled on meds No date: Remote history of TIA No date: Sleep apnea     Comment:  CPAP No date: Tremors of nervous system     Comment:  Right upper extremity 2008: West Nile Virus infection     Comment:  pain in knee-thigh in right leg  Past Surgical History: 08/16/2023: CATARACT EXTRACTION W/PHACO; Left     Comment:  Procedure: PHACOEMULSIFICATION, CATARACT, WITH IOL               INSERTION 6.97, 00:44.9;  Surgeon: Enola Feliciano Hugger, MD;  Location: [facility];  Service:               Ophthalmology;  Laterality: Left; 10/10/2019: COLONOSCOPY WITH PROPOFOL ; N/A     Comment:  Procedure: COLONOSCOPY WITH BIOPSY;  Surgeon: Jinny Carmine, MD;  Location: [facility];  Service:               Endoscopy;  Laterality: N/A;  priority 3 No date: EYE SURGERY 2013: HAND SURGERY; Bilateral     Comment:  carpal tunnel both hands 10/10/2019: POLYPECTOMY; N/A     Comment:  Procedure: POLYPECTOMY;   Surgeon: Jinny Carmine, MD;                Location: [facility];  Service: Endoscopy;                Laterality: N/A; 2013: ROTATOR CUFF REPAIR; Bilateral     Reproductive/Obstetrics negative OB ROS                              Anesthesia Physical Anesthesia Plan  ASA: 3  Anesthesia Plan: General   Post-op Pain Management: Minimal or no pain anticipated   Induction: Intravenous  PONV Risk Score and Plan: Propofol  infusion and TIVA  Airway Management Planned: Natural Airway  Additional Equipment:   Intra-op Plan:   Post-operative Plan:   Informed Consent: I have reviewed the patients History and Physical, chart, labs and discussed the procedure including the risks, benefits and alternatives for the proposed anesthesia with the patient or authorized representative who has indicated his/her understanding and acceptance.     Dental Advisory Given  Plan Discussed with: CRNA and Surgeon  Anesthesia Plan Comments:  Anesthesia Quick Evaluation

## 2024-01-15 NOTE — Anesthesia Postprocedure Evaluation (Signed)
 Anesthesia Post Note  Patient: Jay Ford  Procedure(s) Performed: EGD (ESOPHAGOGASTRODUODENOSCOPY)  Patient location during evaluation: PACU Anesthesia Type: General Level of consciousness: awake and alert Pain management: pain level controlled Vital Signs Assessment: post-procedure vital signs reviewed and stable Respiratory status: spontaneous breathing, nonlabored ventilation and respiratory function stable Cardiovascular status: blood pressure returned to baseline and stable Postop Assessment: no apparent nausea or vomiting Anesthetic complications: no   No notable events documented.   Last Vitals:  Vitals:   01/15/24 1134 01/15/24 1144  BP: (!) 126/90 (!) 116/92  Pulse: 70 (!) 57  Resp: 19 16  Temp:    SpO2: 96% 100%    Last Pain:  Vitals:   01/15/24 1144  TempSrc:   PainSc: 0-No pain                 [name]

## 2024-01-15 NOTE — Op Note (Addendum)
 [facility] Patient Name: Jay Ford Procedure Date: 01/15/2024 11:04 AM MRN: [id number] Account #: [id number] Date of Birth: January 04, 1961 Admit Type: Outpatient Age: 63 Room: [facility] ENDO ROOM 2 Gender: Male Note Status: Supervisor Override Instrument Name: Barnie GI Scope [phone] [id number] Procedure:             Upper GI endoscopy Indications:           Generalized abdominal pain, Nausea with vomiting Providers:             Ruel Kung MD, MD Referring MD:          No Local Md, MD (Referring MD) Medicines:             Monitored Anesthesia Care Complications:         No immediate complications. Procedure:             Pre-Anesthesia Assessment:                        - Prior to the procedure, a History and Physical was                         performed, and patient medications, allergies and                         sensitivities were reviewed. The patient's tolerance                         of previous anesthesia was reviewed.                        - The risks and benefits of the procedure and the                         sedation options and risks were discussed with the                         patient. All questions were answered and informed                         consent was obtained.                        - ASA Grade Assessment: II - A patient with mild                         systemic disease.                        After obtaining informed consent, the endoscope was                         passed under direct vision. Throughout the procedure,                         the patient's blood pressure, pulse, and oxygen                         saturations were monitored continuously. The Endoscope  was introduced through the mouth, and advanced to the                         third part of duodenum. The upper GI endoscopy was                         accomplished with ease. The patient tolerated the                         procedure  well. Findings:      LA Grade A (one or more mucosal breaks less than 5 mm, not extending       between tops of 2 mucosal folds) esophagitis with no bleeding was found       in the lower third of the esophagus.      The entire examined stomach was normal. Biopsies were taken with a cold       forceps for histology.      The examined duodenum was normal. Biopsies were taken with a cold       forceps for histology.      The cardia and gastric fundus were normal on retroflexion. Impression:            - LA Grade A reflux esophagitis with no bleeding.                        - Normal stomach. Biopsied.                        - Normal examined duodenum. Biopsied. Recommendation:        - Await pathology results.                        - Discharge patient to home (with escort).                        - Resume previous diet.                        - Continue present medications.                        - Return to GI office as previously scheduled. Procedure Code(s):     --- Professional ---                        [phone], Esophagogastroduodenoscopy, flexible,                         transoral; with biopsy, single or multiple Diagnosis Code(s):     --- Professional ---                        K21.00, Gastro-esophageal reflux disease with                         esophagitis, without bleeding                        R10.84, Generalized abdominal pain                        R11.2, Nausea  with vomiting, unspecified CPT copyright 2022 American Medical Association. All rights reserved. The codes documented in this report are preliminary and upon coder review may  be revised to meet current compliance requirements. Ruel Kung, MD Ruel Kung MD, MD 01/15/2024 11:13:22 AM This report has been signed electronically. Number of Addenda: 0 Note Initiated On: 01/15/2024 11:04 AM Estimated Blood Loss:  Estimated blood loss: none.      [facility]

## 2024-01-16 LAB — SURGICAL PATHOLOGY

## 2024-01-18 NOTE — Progress Notes (Signed)
/  [facility] Quality Team Note / Name: Jay Ford Date of Birth: August 06, 1960 MRN: [id number] Date: 01/18/2024  [facility] Quality Team has reviewed this patient's chart, please see recommendations below:  [facility] Quality Other; (PATIENT DUE FOR KIDNEY HEALTH EVALUATION. SET TO HAVE COMPLETED AT UPCOMING OFFICE VISIT 01/22/2024 )

## 2024-01-22 ENCOUNTER — Encounter: Payer: Self-pay | Admitting: Internal Medicine

## 2024-01-22 ENCOUNTER — Ambulatory Visit (INDEPENDENT_AMBULATORY_CARE_PROVIDER_SITE_OTHER): Admitting: Internal Medicine

## 2024-01-22 ENCOUNTER — Other Ambulatory Visit: Payer: Self-pay | Admitting: Internal Medicine

## 2024-01-22 VITALS — BP 126/72 | HR 62 | Ht 66.0 in | Wt 234.0 lb

## 2024-01-22 DIAGNOSIS — Z23 Encounter for immunization: Secondary | ICD-10-CM

## 2024-01-22 DIAGNOSIS — G4733 Obstructive sleep apnea (adult) (pediatric): Secondary | ICD-10-CM

## 2024-01-22 DIAGNOSIS — Z6838 Body mass index (BMI) 38.0-38.9, adult: Secondary | ICD-10-CM

## 2024-01-22 DIAGNOSIS — I10 Essential (primary) hypertension: Secondary | ICD-10-CM | POA: Diagnosis not present

## 2024-01-22 DIAGNOSIS — E782 Mixed hyperlipidemia: Secondary | ICD-10-CM | POA: Diagnosis not present

## 2024-01-22 DIAGNOSIS — K209 Esophagitis, unspecified without bleeding: Secondary | ICD-10-CM | POA: Diagnosis not present

## 2024-01-22 MED ORDER — ZEPBOUND 2.5 MG/0.5ML ~~LOC~~ SOAJ: 2.5000 mg | SUBCUTANEOUS | 0 refills | Status: DC

## 2024-01-22 MED ORDER — OMEPRAZOLE 20 MG PO CPDR: 20.0000 mg | DELAYED_RELEASE_CAPSULE | Freq: Every day | ORAL | 1 refills | Status: DC

## 2024-01-22 NOTE — Assessment & Plan Note (Signed)
 Blood pressure is well controlled on triple agents. No medication side effects noted. Plan to continue current medications.

## 2024-01-22 NOTE — Telephone Encounter (Signed)
 Requested medications are due for refill today.  No - see note  Requested medications are on the active medications list.  yes  Last refill. 01/22/2024  Future visit scheduled.   yes  Notes to clinic.  Pharmacy comment: Alternative Requested:NOT COVERED.     Requested Prescriptions  Pending Prescriptions Disp Refills   ZEPBOUND  2.5 MG/0.5ML Pen [Pharmacy Med Name: ZEPBOUND  2.5 MG/0.5 ML PEN]  0    Sig: INJECT 2.5 MG SUBCUTANEOUSLY WEEKLY     Off-Protocol Failed - 01/22/2024  5:01 PM      Failed - Medication not assigned to a protocol, review manually.      Passed - Valid encounter within last 12 months    Recent Outpatient Visits           Today Mixed hyperlipidemia   [place] [facility] at [facility], Leita DEL, MD   1 week ago Epigastric pain   [place] [facility] at [facility], Leita DEL, MD   6 months ago Annual physical exam   [facility] at [facility], Leita DEL, MD

## 2024-01-22 NOTE — Assessment & Plan Note (Signed)
 Recent EGD with negative bx Esophagitis in lower third  Will start PPI

## 2024-01-22 NOTE — Assessment & Plan Note (Addendum)
 He continues with weight gain despite diet efforts. Lab Results  Component Value Date   HGBA1C 6.0 (H) 01/09/2024  He wants to see if GLP-1 is covered.  Zepbound  ordered; follow up in 2 months

## 2024-01-22 NOTE — Progress Notes (Signed)
 Date:  01/22/2024   Name:  Jay Ford   DOB:  21-Mar-1961   MRN:  [id number]   Chief Complaint: Hypertension and Weight Loss (Wants to discuss weight loss. Endoscopy came back normal.)  Hypertension This is a chronic problem. The problem is controlled. Pertinent negatives include no chest pain, headaches, palpitations or shortness of breath. Past treatments include angiotensin blockers, calcium  channel blockers and diuretics. There is no history of kidney disease, CAD/MI or CVA.  Gastroesophageal Reflux He complains of heartburn. He reports no abdominal pain, no chest pain, no coughing or no wheezing. This is a recurrent problem. The problem occurs frequently. Pertinent negatives include no fatigue. Past procedures include an EGD. just done showing only esophagitis.SABRA  BMI elevated - he has tried cutting back, protein shakes and exercise with no benefit. He is prediabetic - last A1C 6.0.  He also has elevated lipids, OSA, HTN.  Review of Systems  Constitutional:  Negative for fatigue and unexpected weight change.  HENT:  Negative for nosebleeds.   Eyes:  Negative for visual disturbance.  Respiratory:  Negative for cough, chest tightness, shortness of breath and wheezing.   Cardiovascular:  Negative for chest pain, palpitations and leg swelling.  Gastrointestinal:  Positive for heartburn. Negative for abdominal pain, constipation and diarrhea.  Neurological:  Negative for dizziness, weakness, light-headedness and headaches.     Lab Results  Component Value Date   NA 138 01/09/2024   K 4.2 01/09/2024   CO2 23 01/09/2024   GLUCOSE 95 01/09/2024   BUN 17 01/09/2024   CREATININE 1.11 01/09/2024   CALCIUM  9.1 01/09/2024   EGFR 75 01/09/2024   GFRNONAA >60 08/06/2020   Lab Results  Component Value Date   CHOL 214 (H) 01/09/2024   HDL 39 (L) 01/09/2024   LDLCALC 130 (H) 01/09/2024   TRIG 255 (H) 01/09/2024   CHOLHDL 5.5 (H) 01/09/2024   Lab Results  Component Value Date    TSH 0.888 01/09/2024   Lab Results  Component Value Date   HGBA1C 6.0 (H) 01/09/2024   Lab Results  Component Value Date   WBC 7.1 01/09/2024   HGB 12.7 (L) 01/09/2024   HCT 39.7 01/09/2024   MCV 95 01/09/2024   PLT 228 01/09/2024   Lab Results  Component Value Date   ALT 32 01/09/2024   AST 30 01/09/2024   ALKPHOS 91 01/09/2024   BILITOT 0.3 01/09/2024   Lab Results  Component Value Date   VD25OH 36.8 10/19/2021     Patient Active Problem List   Diagnosis Date Noted   Esophagitis determined by endoscopy 01/22/2024   Epigastric pain 01/09/2024   Allergy  to meat 02/12/2023   Prediabetes 05/10/2022   B12 deficiency 10/19/2021   Vitamin D  deficiency 10/19/2021   Radiculopathy with lower extremity symptoms 07/19/2021   Polyp of descending colon    Nocturia 10/11/2017   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   BMI 38.0-38.9,adult 06/30/2015   Essential hypertension 02/13/2014   Meralgia paresthetica of both lower extremities 02/13/2014   Mixed hyperlipidemia    MI (mitral incompetence) 02/07/2011   OSA (obstructive sleep apnea) 02/07/2011   Hx of colonic polyp 02/07/2011   Testosterone deficiency 02/07/2011   Tubular adenoma 02/07/2011    Allergies  Allergen Reactions   Gabapentin Other (See Comments)    Is not right, made see things  Other Reaction(s): Hallucinations, Other (See Comments)  Is not right    Other Reaction(s): Other (See Comments)  Is not right, made see things    Is not right Is not right, made see things  Is not right    Is not right, made see things    Is not right, made see things  Other Reaction(s): Hallucinations, Other (See Comments)  Is not right  Other Reaction(s): Other (See Comments)  Is not right, made see things  Is not right Is not right, made see things    Is not right  Other Reaction(s): Other (See Comments)  Is not right, made see things  Is not right Is not right, made see things    Topiramate Rash and Dermatitis    Blistered rash on face  Blistered rash on face    Blistered rash on face  Blistered rash on face Blistered rash on face    Past Surgical History:  Procedure Laterality Date   CATARACT EXTRACTION W/PHACO Left 08/16/2023   Procedure: PHACOEMULSIFICATION, CATARACT, WITH IOL INSERTION 6.97, 00:44.9;  Surgeon: Enola Feliciano Hugger, MD;  Location: [facility];  Service: Ophthalmology;  Laterality: Left;   COLONOSCOPY WITH PROPOFOL  N/A 10/10/2019   Procedure: COLONOSCOPY WITH BIOPSY;  Surgeon: Jinny Carmine, MD;  Location: [facility];  Service: Endoscopy;  Laterality: N/A;  priority 3   ESOPHAGOGASTRODUODENOSCOPY N/A 01/15/2024   Procedure: EGD (ESOPHAGOGASTRODUODENOSCOPY);  Surgeon: Therisa Bi, MD;  Location: [facility] END[facility]COPY;  Service: Gastroenterology;  Laterality: N/A;   EYE SURGERY     HAND SURGERY Bilateral 2013   carpal tunnel both hands   POLYPECTOMY N/A 10/10/2019   Procedure: POLYPECTOMY;  Surgeon: Jinny Carmine, MD;  Location: [facility];  Service: Endoscopy;  Laterality: N/A;   ROTATOR CUFF REPAIR Bilateral 2013    Social History   Tobacco Use   Smoking status: Never   Smokeless tobacco: Never  Vaping Use   Vaping status: Never Used  Substance Use Topics   Alcohol use: No   Drug use: No     Medication list has been reviewed and updated.  Current Meds  Medication Sig   amLODipine -valsartan  (EXFORGE ) 5-160 MG tablet Take 1 tablet by mouth daily.   atorvastatin  (LIPITOR) 40 MG tablet Take 1 tablet (40 mg total) by mouth daily.   CANNABIDIOL PO Take by mouth. Using daily for pain   cyanocobalamin  (,VITAMIN B-12,) 1000 MCG/ML injection Inject 1 mL (1,000 mcg total) into the muscle every 30 (thirty) days.   DULoxetine  (CYMBALTA ) 30 MG capsule Take 3 capsules (90 mg total) by mouth daily.   metoprolol  succinate (TOPROL -XL) 25 MG 24 hr tablet Take 1 tablet (25 mg total) by mouth at bedtime. Take with or immediately  following a meal.   Multiple Vitamin (MULTIVITAMIN) LIQD Take 5 mLs by mouth daily.   nitroGLYCERIN (NITROSTAT) 0.4 MG SL tablet Place 0.4 mg under the tongue every 5 (five) minutes as needed for chest pain.   NON FORMULARY at bedtime. CPAP nightly   Omega-3 Fatty Acids (OMEGA 3 500 PO) Take by mouth.   omeprazole  (PRILOSEC) 20 MG capsule Take 1 capsule (20 mg total) by mouth daily.   Probiotic Product (ALOE 10000 & PROBIOTICS PO) Take by mouth.   tirzepatide  (ZEPBOUND ) 2.5 MG/0.5ML Pen Inject 2.5 mg into the skin once a week.   Vitamin D , Ergocalciferol , (DRISDOL) 1.25 MG (50000 UNIT) CAPS capsule Take 50,000 Units by mouth every 7 (seven) days.       07/19/2023    8:35 AM 02/12/2023    3:33 PM 05/10/2022    2:28 PM 07/19/2021  11:11 AM  GAD 7 : Generalized Anxiety Score  Nervous, Anxious, on Edge 1 0 0 0  Control/stop worrying 0 0 0 0  Worry too much - different things 0 0 0 0  Trouble relaxing 0 0 0 0  Restless 0 0 0 0  Easily annoyed or irritable 0 0 0 0  Afraid - awful might happen 0 0 0 0  Total GAD 7 Score 1 0 0 0  Anxiety Difficulty Not difficult at all Not difficult at all Not difficult at all Not difficult at all       07/19/2023    8:35 AM 02/12/2023    3:32 PM 02/06/2023   10:33 AM  Depression screen PHQ 2/9  Decreased Interest 2 0 0  Down, Depressed, Hopeless 0 0 0  PHQ - 2 Score 2 0 0  Altered sleeping 0 0 0  Tired, decreased energy 2 1 0  Change in appetite 1 0 0  Feeling bad or failure about yourself  0 0 0  Trouble concentrating 2 0 0  Moving slowly or fidgety/restless 0 0 0  Suicidal thoughts 0 0 0  PHQ-9 Score 7 1 0  Difficult doing work/chores Somewhat difficult Not difficult at all Not difficult at all    BP Readings from Last 3 Encounters:  01/22/24 126/72  01/15/24 (!) 116/92  01/09/24 114/72    Physical Exam Vitals and nursing note reviewed.  Constitutional:      General: He is not in acute distress.    Appearance: He is well-developed.   HENT:     Head: Normocephalic and atraumatic.  Pulmonary:     Effort: Pulmonary effort is normal. No respiratory distress.  Skin:    General: Skin is warm and dry.     Findings: No rash.  Neurological:     Mental Status: He is alert and oriented to person, place, and time.  Psychiatric:        Mood and Affect: Mood normal.        Behavior: Behavior normal.     Wt Readings from Last 3 Encounters:  01/22/24 234 lb (106.1 kg)  01/09/24 237 lb (107.5 kg)  08/24/23 233 lb 0.4 oz (105.7 kg)    BP 126/72   Pulse 62   Ht 5' 6 (1.676 m)   Wt 234 lb (106.1 kg)   SpO2 97%   BMI 37.77 kg/m   Assessment and Plan:  Problem List Items Addressed This Visit       Unprioritized   Mixed hyperlipidemia - Primary (Chronic)   Relevant Medications   tirzepatide  (ZEPBOUND ) 2.5 MG/0.5ML Pen   Essential hypertension (Chronic)   Blood pressure is well controlled on triple agents. No medication side effects noted. Plan to continue current medications.       Relevant Medications   tirzepatide  (ZEPBOUND ) 2.5 MG/0.5ML Pen   OSA (obstructive sleep apnea) (Chronic)   He has been unable to tolerate CPAP therapy. He would benefit from weight loss. Will see if Zepbound  will be covered.      Relevant Medications   tirzepatide  (ZEPBOUND ) 2.5 MG/0.5ML Pen   BMI 38.0-38.9,adult (Chronic)   He continues with weight gain despite diet efforts. Lab Results  Component Value Date   HGBA1C 6.0 (H) 01/09/2024  He wants to see if GLP-1 is covered.  Zepbound  ordered; follow up in 2 months       Relevant Medications   tirzepatide  (ZEPBOUND ) 2.5 MG/0.5ML Pen   Esophagitis determined by endoscopy  Recent EGD with negative bx Esophagitis in lower third  Will start PPI      Relevant Medications   omeprazole  (PRILOSEC) 20 MG capsule   Other Visit Diagnoses       Encounter for immunization       Relevant Orders   Flu vaccine trivalent PF, 6mos and older(Flulaval,Afluria,Fluarix,Fluzone)  (Completed)       Return in about 2 months (around 03/23/2024) for HTN, wt management.    Leita HILARIO Adie, MD [facility] [place]

## 2024-01-22 NOTE — Patient Instructions (Signed)
 Start Zepbound  weekly 2.5 mg for 4 doses then call for the next strength 5 mg weekly.  See me in 2 months to follow up.

## 2024-01-22 NOTE — Assessment & Plan Note (Signed)
 He has been unable to tolerate CPAP therapy. He would benefit from weight loss. Will see if Zepbound  will be covered.

## 2024-01-23 ENCOUNTER — Telehealth: Payer: Self-pay

## 2024-01-23 NOTE — Telephone Encounter (Signed)
 Copied from CRM #[id number]. Topic: Clinical - Medication Question >> Jan 23, 2024  3:09 PM Larissa S wrote: Reason for CRM: Patient states insurance is not covering Zepbound  medication. States if medication is coded for sleep apnea, then it can be covered, If not, requesting an alternative medication. Request a callback to spouse, Hyun Marsalis at [phone]

## 2024-01-23 NOTE — Telephone Encounter (Signed)
 Please complete PA for Zepbound  please pt  sleep apnea for the main reason for the medication.  KP

## 2024-01-24 ENCOUNTER — Telehealth: Payer: Self-pay | Admitting: Pharmacy Technician

## 2024-01-24 ENCOUNTER — Other Ambulatory Visit (HOSPITAL_COMMUNITY): Payer: Self-pay

## 2024-01-24 NOTE — Telephone Encounter (Signed)
 Pharmacy Patient Advocate Encounter  Received notification from [employer] that Prior Authorization for Zepbound  2.5MG /0.5ML pen-injectors  has been APPROVED from 01/24/24 to 05/21/24. Ran test claim, Copay is $481.21. This test claim was processed through [facility]- copay amounts may vary at other pharmacies due to pharmacy/plan contracts, or as the patient moves through the different stages of their insurance plan.   PA #/Case ID/Reference #: [id number]

## 2024-01-24 NOTE — Telephone Encounter (Signed)
 For review.  KP

## 2024-01-24 NOTE — Telephone Encounter (Signed)
 PA request has been Submitted. New Encounter has been or will be created for follow up. For additional info see Pharmacy Prior Auth telephone encounter from 01/24/2024.

## 2024-01-24 NOTE — Telephone Encounter (Signed)
 Pharmacy Patient Advocate Encounter   Received notification from Pt Calls Messages that prior authorization for Zepbound  2.5MG /0.5ML pen-injectors  is required/requested.   Insurance verification completed.   The patient is insured through [facility] .   Per test claim: PA required; PA submitted to above mentioned insurance via Latent Key/confirmation #/EOC [id number] Status is pending

## 2024-01-24 NOTE — Telephone Encounter (Signed)
 Submitted and approved.

## 2024-01-25 NOTE — Telephone Encounter (Signed)
 Noted  KP

## 2024-01-30 ENCOUNTER — Telehealth: Payer: Self-pay

## 2024-01-30 NOTE — Telephone Encounter (Signed)
 Copied from CRM #[id number]. Topic: Clinical - Prescription Issue >> Jan 30, 2024 10:48 AM Ivette P wrote: Reason for CRM: PT called in about medication tirzepatide  (ZEPBOUND ) 2.5 MG/0.5ML Pen   Called in last week and has not gotten an update. Pls follow up with pt regarding medication before end of day

## 2024-01-30 NOTE — Telephone Encounter (Signed)
 Copay is 481$.  KP

## 2024-01-30 NOTE — Telephone Encounter (Signed)
 Spoke to pts wife she stated the copay is too much and wants to know if there is another medication that can be sent in. She wants [facility] [place].  [place]

## 2024-01-31 ENCOUNTER — Other Ambulatory Visit: Payer: Self-pay | Admitting: Internal Medicine

## 2024-01-31 ENCOUNTER — Telehealth: Payer: Self-pay

## 2024-01-31 DIAGNOSIS — Z6838 Body mass index (BMI) 38.0-38.9, adult: Secondary | ICD-10-CM

## 2024-01-31 DIAGNOSIS — G4733 Obstructive sleep apnea (adult) (pediatric): Secondary | ICD-10-CM

## 2024-01-31 MED ORDER — SEMAGLUTIDE-WEIGHT MANAGEMENT 0.25 MG/0.5ML ~~LOC~~ SOAJ: 0.2500 mg | SUBCUTANEOUS | 0 refills | Status: DC

## 2024-01-31 NOTE — Progress Notes (Unsigned)
 Date:  01/31/2024   Name:  Jay Ford   DOB:  1960/12/27   MRN:  [id number]   Chief Complaint: No chief complaint on file.  HPI  Review of Systems   Lab Results  Component Value Date   NA 138 01/09/2024   K 4.2 01/09/2024   CO2 23 01/09/2024   GLUCOSE 95 01/09/2024   BUN 17 01/09/2024   CREATININE 1.11 01/09/2024   CALCIUM  9.1 01/09/2024   EGFR 75 01/09/2024   GFRNONAA >60 08/06/2020   Lab Results  Component Value Date   CHOL 214 (H) 01/09/2024   HDL 39 (L) 01/09/2024   LDLCALC 130 (H) 01/09/2024   TRIG 255 (H) 01/09/2024   CHOLHDL 5.5 (H) 01/09/2024   Lab Results  Component Value Date   TSH 0.888 01/09/2024   Lab Results  Component Value Date   HGBA1C 6.0 (H) 01/09/2024   Lab Results  Component Value Date   WBC 7.1 01/09/2024   HGB 12.7 (L) 01/09/2024   HCT 39.7 01/09/2024   MCV 95 01/09/2024   PLT 228 01/09/2024   Lab Results  Component Value Date   ALT 32 01/09/2024   AST 30 01/09/2024   ALKPHOS 91 01/09/2024   BILITOT 0.3 01/09/2024   Lab Results  Component Value Date   VD25OH 36.8 10/19/2021     Patient Active Problem List   Diagnosis Date Noted   Esophagitis determined by endoscopy 01/22/2024   Epigastric pain 01/09/2024   Allergy  to meat 02/12/2023   Prediabetes 05/10/2022   B12 deficiency 10/19/2021   Vitamin D  deficiency 10/19/2021   Radiculopathy with lower extremity symptoms 07/19/2021   Polyp of descending colon    Nocturia 10/11/2017   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   BMI 38.0-38.9,adult 06/30/2015   Essential hypertension 02/13/2014   Meralgia paresthetica of both lower extremities 02/13/2014   Mixed hyperlipidemia    MI (mitral incompetence) 02/07/2011   OSA (obstructive sleep apnea) 02/07/2011   Hx of colonic polyp 02/07/2011   Testosterone deficiency 02/07/2011   Tubular adenoma 02/07/2011    Allergies  Allergen Reactions   Gabapentin Other (See Comments)    Is not  right, made see things  Other Reaction(s): Hallucinations, Other (See Comments)  Is not right    Other Reaction(s): Other (See Comments)    Is not right, made see things    Is not right Is not right, made see things  Is not right    Is not right, made see things    Is not right, made see things  Other Reaction(s): Hallucinations, Other (See Comments)  Is not right  Other Reaction(s): Other (See Comments)  Is not right, made see things  Is not right Is not right, made see things    Is not right  Other Reaction(s): Other (See Comments)  Is not right, made see things  Is not right Is not right, made see things   Topiramate Rash and Dermatitis    Blistered rash on face  Blistered rash on face    Blistered rash on face  Blistered rash on face Blistered rash on face    Past Surgical History:  Procedure Laterality Date   CATARACT EXTRACTION W/PHACO Left 08/16/2023   Procedure: PHACOEMULSIFICATION, CATARACT, WITH IOL INSERTION 6.97, 00:44.9;  Surgeon: Enola Feliciano Hugger, MD;  Location: [facility];  Service: Ophthalmology;  Laterality: Left;   COLONOSCOPY WITH PROPOFOL  N/A 10/10/2019   Procedure: COLONOSCOPY WITH BIOPSY;  Surgeon: Jinny Carmine, MD;  Location: [facility];  Service: Endoscopy;  Laterality: N/A;  priority 3   ESOPHAGOGASTRODUODENOSCOPY N/A 01/15/2024   Procedure: EGD (ESOPHAGOGASTRODUODENOSCOPY);  Surgeon: Therisa Bi, MD;  Location: [facility] ENDOSCOPY;  Service: Gastroenterology;  Laterality: N/A;   EYE SURGERY     HAND SURGERY Bilateral 2013   carpal tunnel both hands   POLYPECTOMY N/A 10/10/2019   Procedure: POLYPECTOMY;  Surgeon: Jinny Carmine, MD;  Location: [facility];  Service: Endoscopy;  Laterality: N/A;   ROTATOR CUFF REPAIR Bilateral 2013    Social History   Tobacco Use   Smoking status: Never   Smokeless tobacco: Never  Vaping Use   Vaping status: Never Used  Substance Use Topics   Alcohol use: No   Drug use: No      Medication list has been reviewed and updated.  No outpatient medications have been marked as taking for the 01/31/24 encounter (Orders Only) with Justus Leita DEL, MD.       07/19/2023    8:35 AM 02/12/2023    3:33 PM 05/10/2022    2:28 PM 07/19/2021   11:11 AM  GAD 7 : Generalized Anxiety Score  Nervous, Anxious, on Edge 1 0 0 0  Control/stop worrying 0 0 0 0  Worry too much - different things 0 0 0 0  Trouble relaxing 0 0 0 0  Restless 0 0 0 0  Easily annoyed or irritable 0 0 0 0  Afraid - awful might happen 0 0 0 0  Total GAD 7 Score 1 0 0 0  Anxiety Difficulty Not difficult at all Not difficult at all Not difficult at all Not difficult at all       07/19/2023    8:35 AM 02/12/2023    3:32 PM 02/06/2023   10:33 AM  Depression screen PHQ 2/9  Decreased Interest 2 0 0  Down, Depressed, Hopeless 0 0 0  PHQ - 2 Score 2 0 0  Altered sleeping 0 0 0  Tired, decreased energy 2 1 0  Change in appetite 1 0 0  Feeling bad or failure about yourself  0 0 0  Trouble concentrating 2 0 0  Moving slowly or fidgety/restless 0 0 0  Suicidal thoughts 0 0 0  PHQ-9 Score 7 1 0  Difficult doing work/chores Somewhat difficult Not difficult at all Not difficult at all    BP Readings from Last 3 Encounters:  01/22/24 126/72  01/15/24 (!) 116/92  01/09/24 114/72    Physical Exam  Wt Readings from Last 3 Encounters:  01/22/24 234 lb (106.1 kg)  01/09/24 237 lb (107.5 kg)  08/24/23 233 lb 0.4 oz (105.7 kg)    There were no vitals taken for this visit.  Assessment and Plan:  Problem List Items Addressed This Visit   None   No follow-ups on file.    Leita HILARIO Justus, MD [facility] [place]

## 2024-01-31 NOTE — Telephone Encounter (Signed)
 Please complete PA for Wegovy 0.25 MG.  KP

## 2024-01-31 NOTE — Telephone Encounter (Signed)
 Called wife told her wegovy  was sent in. She is aware that insurance may not cover it or it may cost a lot.  KP

## 2024-02-01 ENCOUNTER — Other Ambulatory Visit: Payer: Self-pay | Admitting: Internal Medicine

## 2024-02-01 ENCOUNTER — Other Ambulatory Visit (HOSPITAL_COMMUNITY): Payer: Self-pay

## 2024-02-01 DIAGNOSIS — Z6838 Body mass index (BMI) 38.0-38.9, adult: Secondary | ICD-10-CM

## 2024-02-01 DIAGNOSIS — G4733 Obstructive sleep apnea (adult) (pediatric): Secondary | ICD-10-CM

## 2024-02-01 NOTE — Telephone Encounter (Signed)
 Spoke to pts wife she stated she would talk to her husband and see what he wants to do as far as maybe paying out of pocket for Zepbound .  KP

## 2024-02-01 NOTE — Telephone Encounter (Signed)
 Wife notified wants to know the next steps.  KP

## 2024-02-01 NOTE — Telephone Encounter (Signed)
 Please review.  KP

## 2024-02-01 NOTE — Telephone Encounter (Signed)
 PA completed and denied.  KP

## 2024-02-01 NOTE — Telephone Encounter (Signed)
 Requested medication (s) are due for refill today:   An alternative is being requested or needs a PA  (See phar. Note)  Requested medication (s) are on the active medication list:   N/A  Future visit scheduled:   Yes 11/3   Last ordered: PA required   Requested Prescriptions  Pending Prescriptions Disp Refills   WEGOVY  0.25 MG/0.5ML SOAJ SQ injection [Pharmacy Med Name: WEGOVY  0.25 MG/0.5 ML PEN]  0    Sig: INJECT 0.25MG  INTO THE SKIN ONE TIME PER WEEK     Endocrinology:  Diabetes - GLP-1 Receptor Agonists - semaglutide  Failed - 02/01/2024 11:43 AM      Failed - HBA1C in normal range and within 180 days    Hemoglobin A1C  Date Value Ref Range Status  08/11/2021 5.9  Final   Hgb A1c MFr Bld  Date Value Ref Range Status  01/09/2024 6.0 (H) 4.8 - 5.6 % Final    Comment:             Prediabetes: 5.7 - 6.4          Diabetes: >6.4          Glycemic control for adults with diabetes: <7.0          Passed - Cr in normal range and within 360 days    Creatinine, Ser  Date Value Ref Range Status  01/09/2024 1.11 0.76 - 1.27 mg/dL Final         Passed - Valid encounter within last 6 months    Recent Outpatient Visits           1 week ago Mixed hyperlipidemia   [place] [facility] at [facility], Leita DEL, MD   3 weeks ago Epigastric pain   [place] [facility] at [facility], Leita DEL, MD   6 months ago Annual physical exam   [facility] at [facility], Leita DEL, MD

## 2024-02-01 NOTE — Telephone Encounter (Signed)
 Good morning unfortunately Jay Ford's plan will not cover Wegovy  because it is plan/benefit exclusion, non-formulary drug

## 2024-02-04 NOTE — Telephone Encounter (Signed)
 Requested medication (s) are due for refill today: Pharmacy comment: Alternative Requested:PA REQUIRED.   Requested medication (s) are on the active medication list: yes  Last refill:  02/01/24  Future visit scheduled: yes  Notes to clinic:  Pharmacy comment: Alternative Requested:PA REQUIRED.      Requested Prescriptions  Pending Prescriptions Disp Refills   WEGOVY  0.25 MG/0.5ML SOAJ SQ injection [Pharmacy Med Name: WEGOVY  0.25 MG/0.5 ML PEN]  0    Sig: INJECT 0.25MG  INTO THE SKIN ONE TIME PER WEEK     Endocrinology:  Diabetes - GLP-1 Receptor Agonists - semaglutide  Failed - 02/04/2024 11:10 AM      Failed - HBA1C in normal range and within 180 days    Hemoglobin A1C  Date Value Ref Range Status  08/11/2021 5.9  Final   Hgb A1c MFr Bld  Date Value Ref Range Status  01/09/2024 6.0 (H) 4.8 - 5.6 % Final    Comment:             Prediabetes: 5.7 - 6.4          Diabetes: >6.4          Glycemic control for adults with diabetes: <7.0          Passed - Cr in normal range and within 360 days    Creatinine, Ser  Date Value Ref Range Status  01/09/2024 1.11 0.76 - 1.27 mg/dL Final         Passed - Valid encounter within last 6 months    Recent Outpatient Visits           1 week ago Mixed hyperlipidemia   [place] [facility] at [facility], Leita DEL, MD   3 weeks ago Epigastric pain   [place] [facility] at [facility], Leita DEL, MD   6 months ago Annual physical exam   [facility] at [facility], Leita DEL, MD

## 2024-03-06 ENCOUNTER — Ambulatory Visit: Payer: Medicare Other | Admitting: Emergency Medicine

## 2024-03-06 VITALS — Ht 70.0 in | Wt 230.0 lb

## 2024-03-06 DIAGNOSIS — Z0000 Encounter for general adult medical examination without abnormal findings: Secondary | ICD-10-CM | POA: Diagnosis not present

## 2024-03-06 NOTE — Progress Notes (Signed)
 Subjective:   Jay Ford is a 63 y.o. who presents for a Medicare Wellness preventive visit.  As a reminder, Annual Wellness Visits don't include a physical exam, and some assessments may be limited, especially if this visit is performed virtually. We may recommend an in-person follow-up visit with your provider if needed.  Visit Complete: Virtual I connected with  Jay Ford on 03/06/24 by a audio enabled telemedicine application and verified that I am speaking with the correct person using two identifiers.  Patient Location: Home  Provider Location: Home Office  I discussed the limitations of evaluation and management by telemedicine. The patient expressed understanding and agreed to proceed.  Vital Signs: Because this visit was a virtual/telehealth visit, some criteria may be missing or patient reported. Any vitals not documented were not able to be obtained and vitals that have been documented are patient reported.  VideoDeclined- This patient declined Librarian, academic. Therefore the visit was completed with audio only.  Persons Participating in Visit: Patient.  AWV Questionnaire: No: Patient Medicare AWV questionnaire was not completed prior to this visit.  Cardiac Risk Factors include: advanced age (>51men, >74 women);male gender;hypertension;dyslipidemia;obesity (BMI >30kg/m2);Other (see comment), Risk factor comments: prediabetic, OSA (no cpap)     Objective:    Today's Vitals   03/06/24 0851 03/06/24 0852  Weight: 230 lb (104.3 kg)   Height: 5' 10 (1.778 m)   PainSc:  6    Body mass index is 33 kg/m.     03/06/2024    9:07 AM 01/15/2024   10:36 AM 08/24/2023    7:08 PM 08/16/2023    8:03 AM 02/06/2023   10:35 AM 01/26/2021    8:50 AM 01/07/2020    3:08 PM  Advanced Directives  Does Patient Have a Medical Advance Directive? No No No No No No No  Would patient like information on creating a medical advance directive? No -  Guardian declined   No - Patient declined No - Patient declined Yes (MAU/Ambulatory/Procedural Areas - Information given) No - Patient declined    Current Medications (verified) Outpatient Encounter Medications as of 03/06/2024  Medication Sig   amLODipine -valsartan  (EXFORGE ) 5-160 MG tablet Take 1 tablet by mouth daily.   atorvastatin  (LIPITOR) 40 MG tablet Take 1 tablet (40 mg total) by mouth daily.   CANNABIDIOL PO Take by mouth. Using daily for pain   DULoxetine  (CYMBALTA ) 30 MG capsule Take 3 capsules (90 mg total) by mouth daily.   metoprolol  succinate (TOPROL -XL) 25 MG 24 hr tablet Take 1 tablet (25 mg total) by mouth at bedtime. Take with or immediately following a meal.   Multiple Vitamin (MULTIVITAMIN) LIQD Take 5 mLs by mouth daily.   nitroGLYCERIN (NITROSTAT) 0.4 MG SL tablet Place 0.4 mg under the tongue every 5 (five) minutes as needed for chest pain.   omeprazole  (PRILOSEC) 20 MG capsule Take 1 capsule (20 mg total) by mouth daily.   Probiotic Product (ALOE 10000 & PROBIOTICS PO) Take by mouth.   cyanocobalamin  (,VITAMIN B-12,) 1000 MCG/ML injection Inject 1 mL (1,000 mcg total) into the muscle every 30 (thirty) days. (Patient not taking: Reported on 03/06/2024)   NON FORMULARY at bedtime. CPAP nightly (Patient not taking: Reported on 03/06/2024)   Omega-3 Fatty Acids (OMEGA 3 500 PO) Take by mouth. (Patient not taking: Reported on 03/06/2024)   semaglutide -weight management (WEGOVY ) 0.25 MG/0.5ML SOAJ SQ injection Inject 0.25 mg into the skin once a week. (Patient not taking: Reported on 03/06/2024)  Vitamin D , Ergocalciferol , (DRISDOL) 1.25 MG (50000 UNIT) CAPS capsule Take 50,000 Units by mouth every 7 (seven) days. (Patient not taking: Reported on 03/06/2024)   No facility-administered encounter medications on file as of 03/06/2024.    Allergies (verified) Gabapentin and Topiramate   History: Past Medical History:  Diagnosis Date   Hyperlipidemia    Hypertension     controlled on meds   Remote history of TIA    Sleep apnea    CPAP   Tremors of nervous system    Right upper extremity   West Nile Virus infection 2008   pain in knee-thigh in right leg   Past Surgical History:  Procedure Laterality Date   CATARACT EXTRACTION W/PHACO Left 08/16/2023   Procedure: PHACOEMULSIFICATION, CATARACT, WITH IOL INSERTION 6.97, 00:44.9;  Surgeon: Enola Feliciano Hugger, MD;  Location: [facility];  Service: Ophthalmology;  Laterality: Left;   COLONOSCOPY WITH PROPOFOL  N/A 10/10/2019   Procedure: COLONOSCOPY WITH BIOPSY;  Surgeon: Jinny Carmine, MD;  Location: [facility];  Service: Endoscopy;  Laterality: N/A;  priority 3   ESOPHAGOGASTRODUODENOSCOPY N/A 01/15/2024   Procedure: EGD (ESOPHAGOGASTRODUODENOSCOPY);  Surgeon: Therisa Bi, MD;  Location: [facility] ENDOSCOPY;  Service: Gastroenterology;  Laterality: N/A;   EYE SURGERY     HAND SURGERY Bilateral 2013   carpal tunnel both hands   POLYPECTOMY N/A 10/10/2019   Procedure: POLYPECTOMY;  Surgeon: Jinny Carmine, MD;  Location: [facility];  Service: Endoscopy;  Laterality: N/A;   ROTATOR CUFF REPAIR Bilateral 2013   Family History  Problem Relation Age of Onset   Cancer Father        prostate cancer   Diabetes Father    Heart attack Father    Cancer Mother        breast cancer   Diabetes Sister    Social History   Socioeconomic History   Marital status: Married    Spouse name: Jay Ford   Number of children: 2   Years of education: Not on file   Highest education level: High school graduate  Occupational History   Occupation: parent    Comment: on SS Disability  Tobacco Use   Smoking status: Never   Smokeless tobacco: Never  Vaping Use   Vaping status: Never Used  Substance and Sexual Activity   Alcohol use: No   Drug use: No   Sexual activity: Yes    Birth control/protection: None  Other Topics Concern   Not on file  Social History Narrative   03/06/24 2  biological children and 1 step-child/pbt   Social Drivers of Health   Financial Resource Strain: Low Risk  (03/06/2024)   Overall Financial Resource Strain (CARDIA)    Difficulty of Paying Living Expenses: Not hard at all  Food Insecurity: No Food Insecurity (03/06/2024)   Hunger Vital Sign    Worried About Running Out of Food in the Last Year: Never true    Ran Out of Food in the Last Year: Never true  Transportation Needs: No Transportation Needs (03/06/2024)   PRAPARE - Administrator, Civil Service (Medical): No    Lack of Transportation (Non-Medical): No  Physical Activity: Insufficiently Active (03/06/2024)   Exercise Vital Sign    Days of Exercise per Week: 3 days    Minutes of Exercise per Session: 10 min  Stress: No Stress Concern Present (03/06/2024)   [employer] of Occupational Health - Occupational Stress Questionnaire    Feeling of Stress: Not at all  Social  Connections: Moderately Isolated (03/06/2024)   Social Connection and Isolation Panel    Frequency of Communication with Friends and Family: More than three times a week    Frequency of Social Gatherings with Friends and Family: Twice a week    Attends Religious Services: Never    Database administrator or Organizations: No    Attends Engineer, structural: Never    Marital Status: Married    Tobacco Counseling Counseling given: Not Answered    Clinical Intake:  Pre-visit preparation completed: Yes  Pain : 0-10 Pain Score: 6  Pain Type: Chronic pain Pain Location: Leg Pain Orientation: Right Pain Descriptors / Indicators: Aching     BMI - recorded: 33 Nutritional Status: BMI > 30  Obese Nutritional Risks: None Diabetes: No  Lab Results  Component Value Date   HGBA1C 6.0 (H) 01/09/2024   HGBA1C 6.1 (H) 07/19/2023   HGBA1C 5.6 05/10/2022     How often do you need to have someone help you when you read instructions, pamphlets, or other written materials from your  doctor or pharmacy?: 1 - Never  Interpreter Needed?: No  Information entered by :: Vina Ned, CMA   Activities of Daily Living     03/06/2024    8:54 AM 08/16/2023    8:05 AM  In your present state of health, do you have any difficulty performing the following activities:  Hearing? 0 0  Vision? 0 0  Difficulty concentrating or making decisions? 0 0  Walking or climbing stairs? 0   Dressing or bathing? 0   Doing errands, shopping? 1   Comment wife drives to appointments   Preparing Food and eating ? N   Using the Toilet? N   In the past six months, have you accidently leaked urine? N   Do you have problems with loss of bowel control? N   Managing your Medications? N   Managing your Finances? N   Housekeeping or managing your Housekeeping? N     Patient Care Team: Justus Leita DEL, MD as PCP - General (Family Medicine) [facility], [facility]) Therisa Bi, MD as Consulting Physician (Gastroenterology) Tobin Venus Gelineau, MD (Ophthalmology)  I have updated your Care Teams any recent Medical Services you may have received from other providers in the past year.     Assessment:   This is a routine wellness examination for [place].  Hearing/Vision screen Hearing Screening - Comments:: Denies hearing loss  Vision Screening - Comments:: Gets routine eye exams, [facility] [place]   Goals Addressed             This Visit's Progress    Patient Stated       Lose weight       Depression Screen     03/06/2024    9:01 AM 07/19/2023    8:35 AM 02/12/2023    3:32 PM 02/06/2023   10:33 AM 05/10/2022    2:28 PM 02/03/2022    8:19 AM 07/19/2021   11:11 AM  PHQ 2/9 Scores  PHQ - 2 Score 0 2 0 0 0 0 1  PHQ- 9 Score 2 7 1  0 3  2    Fall Risk     03/06/2024    9:07 AM 07/19/2023    8:35 AM 02/12/2023    3:32 PM 02/06/2023   10:35 AM 05/10/2022    2:28 PM  Fall Risk   Falls in the past year? 0 0 0 0 0  Number falls in past yr: 0 0 0 0 0  Injury with  Fall? 0 0 0 0 0  Risk for fall due to : No Fall Risks No Fall Risks No Fall Risks No Fall Risks No Fall Risks  Follow up Falls evaluation completed Falls evaluation completed Falls evaluation completed Falls prevention discussed;Falls evaluation completed Falls evaluation completed      Data saved with a previous flowsheet row definition    MEDICARE RISK AT HOME:  Medicare Risk at Home Any stairs in or around the home?: Yes (1 step) If so, are there any without handrails?: No Home free of loose throw rugs in walkways, pet beds, electrical cords, etc?: Yes Adequate lighting in your home to reduce risk of falls?: Yes Life alert?: No Use of a cane, walker or w/c?: No Grab bars in the bathroom?: Yes Shower chair or bench in shower?: Yes Elevated toilet seat or a handicapped toilet?: No  TIMED UP AND GO:  Was the test performed?  No  Cognitive Function: 6CIT completed        03/06/2024    9:09 AM 02/06/2023   10:36 AM 02/03/2022    8:20 AM  6CIT Screen  What Year? 0 points 0 points 0 points  What month? 0 points 0 points 0 points  What time? 0 points 3 points 0 points  Count back from 20 0 points 2 points 0 points  Months in reverse 4 points 4 points 0 points  Repeat phrase 4 points 0 points 8 points  Total Score 8 points 9 points 8 points    Immunizations Immunization History  Administered Date(s) Administered   Influenza, Seasonal, Injecte, Preservative Fre 02/12/2023, 01/22/2024   Influenza,inj,Quad PF,6+ Mos 03/20/2016, 03/05/2017, 04/10/2018, 02/01/2021, 05/10/2022   Influenza-Unspecified 02/20/2015   PNEUMOCOCCAL CONJUGATE-20 07/19/2023   Tdap 06/30/2015    Screening Tests Health Maintenance  Topic Date Due   Zoster Vaccines- Shingrix (1 of 2) Never done   COVID-19 Vaccine (1) 01/23/2025 (Originally 07/02/1965)   Colonoscopy  10/09/2024   Medicare Annual Wellness (AWV)  03/06/2025   DTaP/Tdap/Td (2 - Td or Tdap) 06/29/2025   Pneumococcal Vaccine: 50+ Years   Completed   Influenza Vaccine  Completed   Hepatitis C Screening  Completed   HIV Screening  Completed   Hepatitis B Vaccines 19-59 Average Risk  Aged Out   HPV VACCINES  Aged Out   Meningococcal B Vaccine  Aged Out   HEMOGLOBIN A1C  Discontinued    Health Maintenance Items Addressed: Vaccines Due: Shingrix, See Nurse Notes at the end of this note  Additional Screening:  Vision Screening: Recommended annual ophthalmology exams for early detection of glaucoma and other disorders of the eye. Is the patient up to date with their annual eye exam?  Yes  Who is the provider or what is the name of the office in which the patient attends annual eye exams? [facility] [place]  Dental Screening: Recommended annual dental exams for proper oral hygiene  Community Resource Referral / Chronic Care Management: CRR required this visit?  No   CCM required this visit?  No   Plan:    I have personally reviewed and noted the following in the patient's chart:   Medical and social history Use of alcohol, tobacco or illicit drugs  Current medications and supplements including opioid prescriptions. Patient is not currently taking opioid prescriptions. Functional ability and status Nutritional status Physical activity Advanced directives List of other physicians Hospitalizations, surgeries, and ER  visits in previous 12 months Vitals Screenings to include cognitive, depression, and falls Referrals and appointments  In addition, I have reviewed and discussed with patient certain preventive protocols, quality metrics, and best practice recommendations. A written personalized care plan for preventive services as well as general preventive health recommendations were provided to patient.   Vina Ned, CMA   03/06/2024   After Visit Summary: (MyChart) Due to this being a telephonic visit, the after visit summary with patients personalized plan was offered to patient via MyChart    Notes:  6 CIT Score - 8 Needs Shingrix vaccine (pharmacy) Declined Covid vaccine

## 2024-03-06 NOTE — Patient Instructions (Signed)
 Jay Ford,  Thank you for taking the time for your Medicare Wellness Visit. I appreciate your continued commitment to your health goals. Please review the care plan we discussed, and feel free to reach out if I can assist you further.  Medicare recommends these wellness visits once per year to help you and your care team stay ahead of potential health issues. These visits are designed to focus on prevention, allowing your provider to concentrate on managing your acute and chronic conditions during your regular appointments.  Please note that Annual Wellness Visits do not include a physical exam. Some assessments may be limited, especially if the visit was conducted virtually. If needed, we may recommend a separate in-person follow-up with your provider.  Ongoing Care Seeing your primary care provider every 3 to 6 months helps us  monitor your health and provide consistent, personalized care.   Referrals If a referral was made during today's visit and you haven't received any updates within two weeks, please contact the referred provider directly to check on the status.  Recommended Screenings: Get the Shingles vaccine at your local pharmacy at your convenience.   Health Maintenance  Topic Date Due   Zoster (Shingles) Vaccine (1 of 2) Never done   COVID-19 Vaccine (1) 01/23/2025*   Colon Cancer Screening  10/09/2024   Medicare Annual Wellness Visit  03/06/2025   DTaP/Tdap/Td vaccine (2 - Td or Tdap) 06/29/2025   Pneumococcal Vaccine for age over 21  Completed   Flu Shot  Completed   Hepatitis C Screening  Completed   HIV Screening  Completed   Hepatitis B Vaccine  Aged Out   HPV Vaccine  Aged Out   Meningitis B Vaccine  Aged Out   Hemoglobin A1C  Discontinued  *Topic was postponed. The date shown is not the original due date.       03/06/2024    9:07 AM  Advanced Directives  Does Patient Have a Medical Advance Directive? No  Would patient like information on creating a medical  advance directive? No - Guardian declined   Advance Care Planning is important because it: Ensures you receive medical care that aligns with your values, goals, and preferences. Provides guidance to your family and loved ones, reducing the emotional burden of decision-making during critical moments.  Vision: Annual vision screenings are recommended for early detection of glaucoma, cataracts, and diabetic retinopathy. These exams can also reveal signs of chronic conditions such as diabetes and high blood pressure.  Dental: Annual dental screenings help detect early signs of oral cancer, gum disease, and other conditions linked to overall health, including heart disease and diabetes.  Please see the attached documents for additional preventive care recommendations.   Fall Prevention in the Home, Adult Falls can cause injuries and affect people of all ages. There are many simple things that you can do to make your home safe and to help prevent falls. If you need it, ask for help making these changes. What actions can I take to prevent falls? General information Use good lighting in all rooms. Make sure to: Replace any light bulbs that burn out. Turn on lights if it is dark and use night-lights. Keep items that you use often in easy-to-reach places. Lower the shelves around your home if needed. Move furniture so that there are clear paths around it. Do not keep throw rugs or other things on the floor that can make you trip. If any of your floors are uneven, fix them. Add color or contrast  paint or tape to clearly mark and help you see: Grab bars or handrails. First and last steps of staircases. Where the edge of each step is. If you use a ladder or stepladder: Make sure that it is fully opened. Do not climb a closed ladder. Make sure the sides of the ladder are locked in place. Have someone hold the ladder while you use it. Know where your pets are as you move through your home. What can  I do in the bathroom?     Keep the floor dry. Clean up any water  that is on the floor right away. Remove soap buildup in the bathtub or shower. Buildup makes bathtubs and showers slippery. Use non-skid mats or decals on the floor of the bathtub or shower. Attach bath mats securely with double-sided, non-slip rug tape. If you need to sit down while you are in the shower, use a non-slip stool. Install grab bars by the toilet and in the bathtub and shower. Do not use towel bars as grab bars. What can I do in the bedroom? Make sure that you have a light by your bed that is easy to reach. Do not use any sheets or blankets on your bed that hang to the floor. Have a firm bench or chair with side arms that you can use for support when you get dressed. What can I do in the kitchen? Clean up any spills right away. If you need to reach something above you, use a sturdy step stool that has a grab bar. Keep electrical cables out of the way. Do not use floor polish or wax that makes floors slippery. What can I do with my stairs? Do not leave anything on the stairs. Make sure that you have a light switch at the top and the bottom of the stairs. Have them installed if you do not have them. Make sure that there are handrails on both sides of the stairs. Fix handrails that are broken or loose. Make sure that handrails are as long as the staircases. Install non-slip stair treads on all stairs in your home if they do not have carpet. Avoid having throw rugs at the top or bottom of stairs, or secure the rugs with carpet tape to prevent them from moving. Choose a carpet design that does not hide the edge of steps on the stairs. Make sure that carpet is firmly attached to the stairs. Fix any carpet that is loose or worn. What can I do on the outside of my home? Use bright outdoor lighting. Repair the edges of walkways and driveways and fix any cracks. Clear paths of anything that can make you trip, such as  tools or rocks. Add color or contrast paint or tape to clearly mark and help you see high doorway thresholds. Trim any bushes or trees on the main path into your home. Check that handrails are securely fastened and in good repair. Both sides of all steps should have handrails. Install guardrails along the edges of any raised decks or porches. Have leaves, snow, and ice cleared regularly. Use sand, salt, or ice melt on walkways during winter months if you live where there is ice and snow. In the garage, clean up any spills right away, including grease or oil spills. What other actions can I take? Review your medicines with your health care provider. Some medicines can make you confused or feel dizzy. This can increase your chance of falling. Wear closed-toe shoes that fit well and support  your feet. Wear shoes that have rubber soles and low heels. Use a cane, walker, scooter, or crutches that help you move around if needed. Talk with your provider about other ways that you can decrease your risk of falls. This may include seeing a physical therapist to learn to do exercises to improve movement and strength. Where to find more information Centers for Disease Control and Prevention, STEAD[employer]: TonerPromos.no [employer] on Aging: BaseRingTones.pl National Institute on Aging: BaseRingTones.pl Contact a health care provider if: You are afraid of falling at home. You feel weak, drowsy, or dizzy at home. You fall at home. Get help right away if you: Lose consciousness or have trouble moving after a fall. Have a fall that causes a head injury. These symptoms may be an emergency. Get help right away. Call 911. Do not wait to see if the symptoms will go away. Do not drive yourself to the hospital. This information is not intended to replace advice given to you by your health care provider. Make sure you discuss any questions you have with your health care provider. Document Revised: 01/09/2022 Document Reviewed:  01/09/2022 Elsevier Patient Education  2024 [employer].

## 2024-03-10 ENCOUNTER — Telehealth: Payer: Self-pay

## 2024-03-10 NOTE — Telephone Encounter (Signed)
 Noted  KP

## 2024-03-10 NOTE — Progress Notes (Signed)
 Jay Ford                                          MRN: [id number]   03/10/2024   The [facility] Quality Team Specialist reviewed this patient medical record for the purposes of chart review for care gap closure. The following were reviewed: chart review for care gap closure-kidney health evaluation for diabetes:eGFR  and uACR. Patient has appt 11/3- could the uACR be completed at this time?    [facility] Quality Team

## 2024-03-24 ENCOUNTER — Ambulatory Visit: Admitting: Internal Medicine

## 2024-04-07 NOTE — Progress Notes (Addendum)
 Jay Ford                                          MRN: [id number]   04/07/2024   The [facility] Quality Team Specialist reviewed this patient medical record for the purposes of chart review for care gap closure. The following were reviewed: chart review for care gap closure-kidney health evaluation for diabetes:eGFR  and uACR.  05/20/2024- CANNOT CLOSE KED.    [facility] Quality Team

## 2024-04-15 ENCOUNTER — Other Ambulatory Visit: Payer: Self-pay | Admitting: Internal Medicine

## 2024-04-15 DIAGNOSIS — I10 Essential (primary) hypertension: Secondary | ICD-10-CM

## 2024-04-15 NOTE — Telephone Encounter (Signed)
 Copied from CRM [id number]. Topic: Clinical - Medication Refill >> Apr 15, 2024  8:49 AM Wess RAMAN wrote: Medication: amLODipine -valsartan  (EXFORGE ) 5-160 MG tablet  Has the patient contacted their pharmacy? Yes (Agent: If no, request that the patient contact the pharmacy for the refill. If patient does not wish to contact the pharmacy document the reason why and proceed with request.) (Agent: If yes, when and what did the pharmacy advise?)  This is the patient's preferred pharmacy:  [id number] - [place], [place] - [address] [place] [address] Phone: [phone] Fax: [phone]   Is this the correct pharmacy for this prescription? Yes If no, delete pharmacy and type the correct one.   Has the prescription been filled recently? Yes  Is the patient out of the medication? Yes  Has the patient been seen for an appointment in the last year OR does the patient have an upcoming appointment? Yes  Can we respond through MyChart? Yes  Agent: Please be advised that Rx refills may take up to 3 business days. We ask that you follow-up with your pharmacy.

## 2024-04-16 NOTE — Telephone Encounter (Signed)
 Requested Prescriptions  Pending Prescriptions Disp Refills   amLODipine -valsartan  (EXFORGE ) 5-160 MG tablet 90 tablet 1    Sig: Take 1 tablet by mouth daily.     Cardiovascular: CCB + ARB Combos Passed - 04/16/2024 12:09 PM      Passed - K in normal range and within 180 days    Potassium  Date Value Ref Range Status  01/09/2024 4.2 3.5 - 5.2 mmol/L Final         Passed - Cr in normal range and within 180 days    Creatinine, Ser  Date Value Ref Range Status  01/09/2024 1.11 0.76 - 1.27 mg/dL Final         Passed - Na in normal range and within 180 days    Sodium  Date Value Ref Range Status  01/09/2024 138 134 - 144 mmol/L Final         Passed - Patient is not pregnant      Passed - Last BP in normal range    BP Readings from Last 1 Encounters:  01/22/24 126/72         Passed - Valid encounter within last 6 months    Recent Outpatient Visits           2 months ago Mixed hyperlipidemia   [place] [facility], Leita DEL, MD   3 months ago Epigastric pain   [place] [facility], Leita DEL, MD   9 months ago Annual physical exam   [facility] at [facility], Leita DEL, MD

## 2024-04-21 ENCOUNTER — Other Ambulatory Visit: Payer: Self-pay | Admitting: Internal Medicine

## 2024-04-21 ENCOUNTER — Ambulatory Visit: Admitting: Internal Medicine

## 2024-04-21 ENCOUNTER — Other Ambulatory Visit: Payer: Self-pay

## 2024-04-21 DIAGNOSIS — K209 Esophagitis, unspecified without bleeding: Secondary | ICD-10-CM

## 2024-04-21 DIAGNOSIS — G5713 Meralgia paresthetica, bilateral lower limbs: Secondary | ICD-10-CM

## 2024-04-21 DIAGNOSIS — E782 Mixed hyperlipidemia: Secondary | ICD-10-CM

## 2024-04-21 DIAGNOSIS — I10 Essential (primary) hypertension: Secondary | ICD-10-CM

## 2024-04-21 DIAGNOSIS — A9232 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: Secondary | ICD-10-CM

## 2024-04-21 MED ORDER — ATORVASTATIN CALCIUM 40 MG PO TABS: 40.0000 mg | ORAL_TABLET | Freq: Every day | ORAL | 1 refills | Status: AC

## 2024-04-21 MED ORDER — DULOXETINE HCL 30 MG PO CPEP: 90.0000 mg | ORAL_CAPSULE | Freq: Every day | ORAL | 1 refills | Status: AC

## 2024-04-21 MED ORDER — AMLODIPINE BESYLATE-VALSARTAN 5-160 MG PO TABS: 1.0000 | ORAL_TABLET | Freq: Every day | ORAL | 1 refills | Status: AC

## 2024-04-21 MED ORDER — OMEPRAZOLE 20 MG PO CPDR: 20.0000 mg | DELAYED_RELEASE_CAPSULE | Freq: Every day | ORAL | 1 refills | Status: AC

## 2024-04-21 MED ORDER — METOPROLOL SUCCINATE ER 25 MG PO TB24: 25.0000 mg | ORAL_TABLET | Freq: Every day | ORAL | 1 refills | Status: AC

## 2024-04-21 NOTE — Progress Notes (Unsigned)
 Date:  04/21/2024   Name:  Jay Ford   DOB:  [birth year]   MRN:  [id number]   Chief Complaint: No chief complaint on file.  HPI  Review of Systems   Lab Results  Component Value Date   NA 138 01/09/2024   K 4.2 01/09/2024   CO2 23 01/09/2024   GLUCOSE 95 01/09/2024   BUN 17 01/09/2024   CREATININE 1.11 01/09/2024   CALCIUM  9.1 01/09/2024   EGFR 75 01/09/2024   GFRNONAA >60 08/06/2020   Lab Results  Component Value Date   CHOL 214 (H) 01/09/2024   HDL 39 (L) 01/09/2024   LDLCALC 130 (H) 01/09/2024   TRIG 255 (H) 01/09/2024   CHOLHDL 5.5 (H) 01/09/2024   Lab Results  Component Value Date   TSH 0.888 01/09/2024   Lab Results  Component Value Date   HGBA1C 6.0 (H) 01/09/2024   Lab Results  Component Value Date   WBC 7.1 01/09/2024   HGB 12.7 (L) 01/09/2024   HCT 39.7 01/09/2024   MCV 95 01/09/2024   PLT 228 01/09/2024   Lab Results  Component Value Date   ALT 32 01/09/2024   AST 30 01/09/2024   ALKPHOS 91 01/09/2024   BILITOT 0.3 01/09/2024   Lab Results  Component Value Date   VD25OH 36.8 10/19/2021     Patient Active Problem List   Diagnosis Date Noted   Esophagitis determined by endoscopy 01/22/2024   Epigastric pain 01/09/2024   Allergy  to meat 02/12/2023   Prediabetes 05/10/2022   B12 deficiency 10/19/2021   Vitamin D  deficiency 10/19/2021   Radiculopathy with lower extremity symptoms 07/19/2021   Polyp of descending colon    Nocturia 10/11/2017   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   BMI 38.0-38.9,adult 06/30/2015   Essential hypertension 02/13/2014   Meralgia paresthetica of both lower extremities 02/13/2014   Mixed hyperlipidemia    MI (mitral incompetence) 02/07/2011   OSA (obstructive sleep apnea) 02/07/2011   Hx of colonic polyp 02/07/2011   Testosterone deficiency 02/07/2011   Tubular adenoma 02/07/2011    Allergies  Allergen Reactions   Gabapentin Other (See Comments)    Is not  right, made see things  Other Reaction(s): Hallucinations, Other (See Comments)  Is not right    Other Reaction(s): Other (See Comments)    Is not right, made see things    Is not right Is not right, made see things  Is not right    Is not right, made see things    Is not right, made see things  Other Reaction(s): Hallucinations, Other (See Comments)  Is not right  Other Reaction(s): Other (See Comments)  Is not right, made see things  Is not right Is not right, made see things    Is not right  Other Reaction(s): Other (See Comments)  Is not right, made see things  Is not right Is not right, made see things   Topiramate Rash and Dermatitis    Blistered rash on face  Blistered rash on face    Blistered rash on face  Blistered rash on face Blistered rash on face    Past Surgical History:  Procedure Laterality Date   CATARACT EXTRACTION W/PHACO Left 08/16/2023   Procedure: PHACOEMULSIFICATION, CATARACT, WITH IOL INSERTION 6.97, 00:44.9;  Surgeon: Enola Feliciano Hugger, MD;  Location[facility];  Service: Ophthalmology;  Laterality: Left;   COLONOSCOPY WITH PROPOFOL  N/A 10/10/2019   Procedure: COLONOSCOPY WITH BIOPSY;  Surgeon: Jinny Carmine, MD;  Location: [facility];  Service: Endoscopy;  Laterality: N/A;  priority 3   ESOPHAGOGASTRODUODENOSCOPY N/A 01/15/2024   Procedure: EGD (ESOPHAGOGASTRODUODENOSCOPY);  Surgeon: Therisa Bi, MD;  Location: [facility];  Service: Gastroenterology;  Laterality: N/A;   EYE SURGERY     HAND SURGERY Bilateral 2013   carpal tunnel both hands   POLYPECTOMY N/A 10/10/2019   Procedure: POLYPECTOMY;  Surgeon: Jinny Carmine, MD;  Location: [facility];  Service: Endoscopy;  Laterality: N/A;   ROTATOR CUFF REPAIR Bilateral 2013    Social History   Tobacco Use   Smoking status: Never   Smokeless tobacco: Never  Vaping Use   Vaping status: Never Used  Substance Use Topics   Alcohol use: No   Drug use: No      Medication list has been reviewed and updated.  No outpatient medications have been marked as taking for the 04/21/24 encounter (Orders Only) with Justus Leita DEL, MD.       07/19/2023    8:35 AM 02/12/2023    3:33 PM 05/10/2022    2:28 PM 07/19/2021   11:11 AM  GAD 7 : Generalized Anxiety Score  Nervous, Anxious, on Edge 1 0 0 0  Control/stop worrying 0 0 0 0  Worry too much - different things 0 0 0 0  Trouble relaxing 0 0 0 0  Restless 0 0 0 0  Easily annoyed or irritable 0 0 0 0  Afraid - awful might happen 0 0 0 0  Total GAD 7 Score 1 0 0 0  Anxiety Difficulty Not difficult at all Not difficult at all Not difficult at all Not difficult at all       03/06/2024    9:01 AM 07/19/2023    8:35 AM 02/12/2023    3:32 PM  Depression screen PHQ 2/9  Decreased Interest 0 2 0  Down, Depressed, Hopeless 0 0 0  PHQ - 2 Score 0 2 0  Altered sleeping 0 0 0  Tired, decreased energy 2 2 1   Change in appetite 0 1 0  Feeling bad or failure about yourself  0 0 0  Trouble concentrating 0 2 0  Moving slowly or fidgety/restless 0 0 0  Suicidal thoughts 0 0 0  PHQ-9 Score 2  7  1    Difficult doing work/chores Not difficult at all Somewhat difficult Not difficult at all     Data saved with a previous flowsheet row definition    BP Readings from Last 3 Encounters:  01/22/24 126/72  01/15/24 (!) 116/92  01/09/24 114/72    Physical Exam  Wt Readings from Last 3 Encounters:  03/06/24 230 lb (104.3 kg)  01/22/24 234 lb (106.1 kg)  01/09/24 237 lb (107.5 kg)    There were no vitals taken for this visit.  Assessment and Plan:  Problem List Items Addressed This Visit   None   No follow-ups on file.    Leita HILARIO Justus, MD [facility] [place]

## 2024-08-06 ENCOUNTER — Ambulatory Visit: Admitting: Student

## 2025-03-18 ENCOUNTER — Ambulatory Visit
